# Patient Record
Sex: Female | Born: 1975
Health system: Southern US, Community
[De-identification: ages and names within clinical notes are randomized; demographics above are authoritative.]

## PROBLEM LIST (undated history)

## (undated) DIAGNOSIS — N83209 Unspecified ovarian cyst, unspecified side: Secondary | ICD-10-CM

## (undated) DIAGNOSIS — L280 Lichen simplex chronicus: Secondary | ICD-10-CM

## (undated) DIAGNOSIS — N920 Excessive and frequent menstruation with regular cycle: Secondary | ICD-10-CM

## (undated) DIAGNOSIS — N189 Chronic kidney disease, unspecified: Secondary | ICD-10-CM

## (undated) DIAGNOSIS — M542 Cervicalgia: Secondary | ICD-10-CM

## (undated) DIAGNOSIS — Z8489 Family history of other specified conditions: Secondary | ICD-10-CM

## (undated) DIAGNOSIS — M545 Low back pain, unspecified: Secondary | ICD-10-CM

## (undated) DIAGNOSIS — R519 Headache, unspecified: Secondary | ICD-10-CM

## (undated) DIAGNOSIS — N809 Endometriosis, unspecified: Secondary | ICD-10-CM

## (undated) DIAGNOSIS — M5440 Lumbago with sciatica, unspecified side: Secondary | ICD-10-CM

## (undated) DIAGNOSIS — R51 Headache: Secondary | ICD-10-CM

## (undated) DIAGNOSIS — K219 Gastro-esophageal reflux disease without esophagitis: Secondary | ICD-10-CM

## (undated) DIAGNOSIS — M5030 Other cervical disc degeneration, unspecified cervical region: Secondary | ICD-10-CM

## (undated) DIAGNOSIS — F419 Anxiety disorder, unspecified: Secondary | ICD-10-CM

## (undated) DIAGNOSIS — M1990 Unspecified osteoarthritis, unspecified site: Secondary | ICD-10-CM

## (undated) HISTORY — DX: Lumbago with sciatica, unspecified side: M54.40

## (undated) HISTORY — PX: ABDOMINAL HYSTERECTOMY: SHX81

## (undated) HISTORY — DX: Headache, unspecified: R51.9

## (undated) HISTORY — PX: ANKLE SURGERY: SHX546

## (undated) HISTORY — PX: GALLBLADDER SURGERY: SHX652

## (undated) HISTORY — DX: Headache: R51

---
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
## 2005-11-16 ENCOUNTER — Emergency Department: Payer: Self-pay | Admitting: Emergency Medicine

## 2005-11-18 ENCOUNTER — Inpatient Hospital Stay: Payer: Self-pay | Admitting: Internal Medicine

## 2006-04-19 ENCOUNTER — Emergency Department: Payer: Self-pay | Admitting: Emergency Medicine

## 2006-08-28 ENCOUNTER — Emergency Department: Payer: Self-pay | Admitting: Emergency Medicine

## 2007-03-19 ENCOUNTER — Emergency Department: Payer: Self-pay | Admitting: Emergency Medicine

## 2007-07-10 ENCOUNTER — Emergency Department: Payer: Self-pay | Admitting: Emergency Medicine

## 2007-10-07 ENCOUNTER — Emergency Department: Payer: Self-pay | Admitting: Emergency Medicine

## 2007-11-09 ENCOUNTER — Emergency Department: Payer: Self-pay | Admitting: Emergency Medicine

## 2009-03-22 ENCOUNTER — Emergency Department: Payer: Self-pay | Admitting: Emergency Medicine

## 2009-06-18 ENCOUNTER — Emergency Department: Payer: Self-pay | Admitting: Emergency Medicine

## 2010-02-04 ENCOUNTER — Emergency Department: Payer: Self-pay | Admitting: Emergency Medicine

## 2010-11-04 ENCOUNTER — Emergency Department: Payer: Self-pay | Admitting: Emergency Medicine

## 2013-04-12 ENCOUNTER — Emergency Department: Payer: Self-pay | Admitting: Emergency Medicine

## 2013-04-12 LAB — URINALYSIS, COMPLETE
Bilirubin,UR: NEGATIVE
Glucose,UR: NEGATIVE mg/dL (ref 0–75)
Nitrite: NEGATIVE
Ph: 5 (ref 4.5–8.0)
Protein: NEGATIVE
Specific Gravity: 1.025 (ref 1.003–1.030)
Squamous Epithelial: 9
WBC UR: 2 /HPF (ref 0–5)

## 2013-04-12 LAB — CBC
HCT: 37.7 % (ref 35.0–47.0)
HGB: 12.6 g/dL (ref 12.0–16.0)
MCHC: 33.6 g/dL (ref 32.0–36.0)
MCV: 83 fL (ref 80–100)
Platelet: 241 10*3/uL (ref 150–440)
RDW: 13.9 % (ref 11.5–14.5)
WBC: 5.8 10*3/uL (ref 3.6–11.0)

## 2013-04-12 LAB — COMPREHENSIVE METABOLIC PANEL
Alkaline Phosphatase: 64 U/L (ref 50–136)
BUN: 14 mg/dL (ref 7–18)
Creatinine: 0.89 mg/dL (ref 0.60–1.30)
EGFR (African American): >60
EGFR (Non-African Amer.): >60
Glucose: 126 mg/dL — ABNORMAL HIGH (ref 65–99)
Osmolality: 283 (ref 275–301)
SGOT(AST): 31 U/L (ref 15–37)
Sodium: 141 mmol/L (ref 136–145)

## 2013-04-12 IMAGING — US US RENAL KIDNEY
1 series · 14 of 25 positions shown · non-contrast
Comparison: none

REASON FOR EXAM: right flank pain hematuria
COMMENTS:

PROCEDURE:     US  - US KIDNEY  - [DATE] [DATE]
RESULT:     Comparison: None.
TECHNIQUE: Multiple grayscale and color Doppler images were obtained of the
kidneys.

[Series 1: us renal kidney · 0.25mm/px · 14 of 46 slices shown]
[im 1/46]
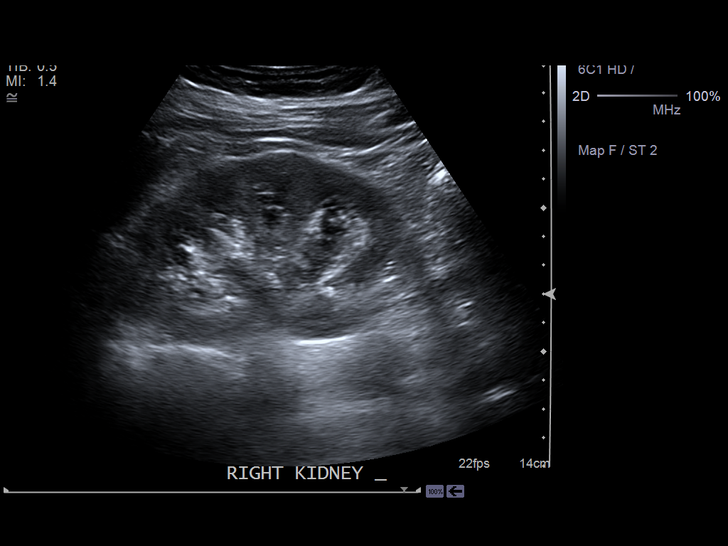
[im 4/46]
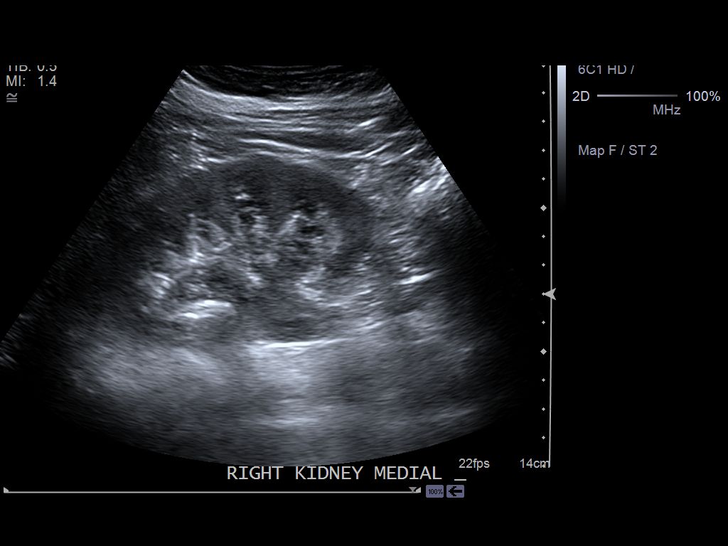
[im 8/46]
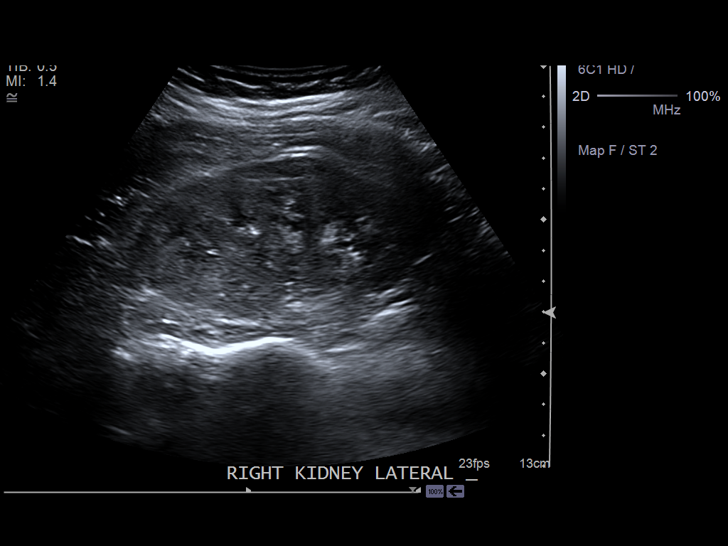
[im 12/46]
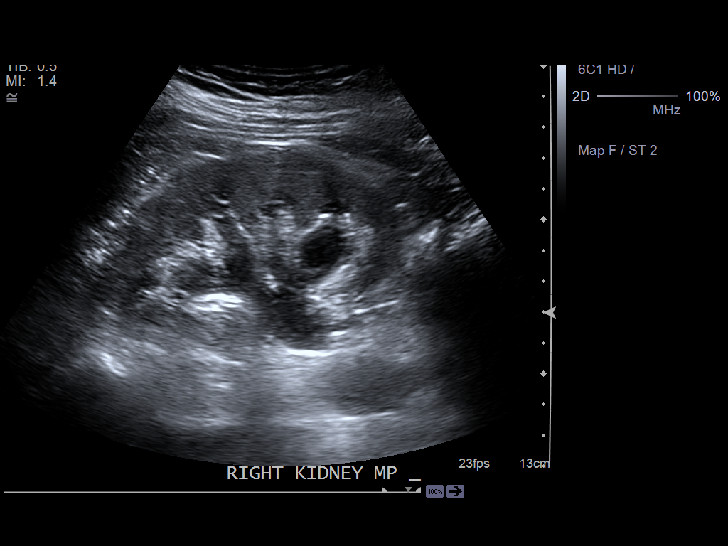
[im 16/46]
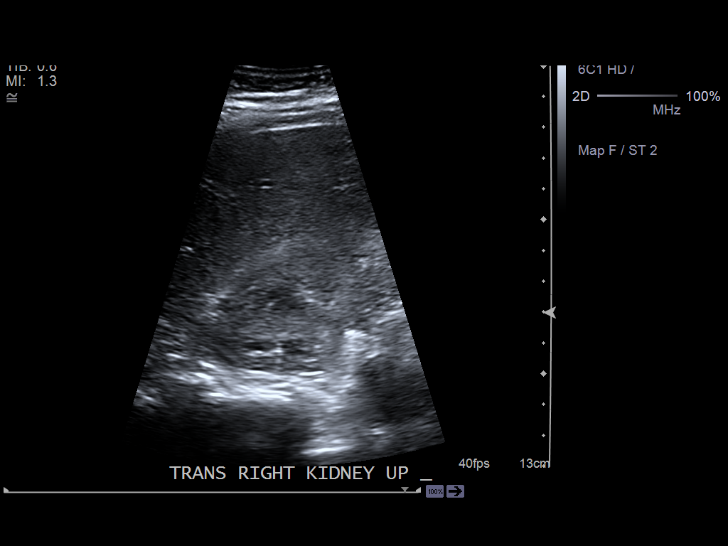
[im 17/46]
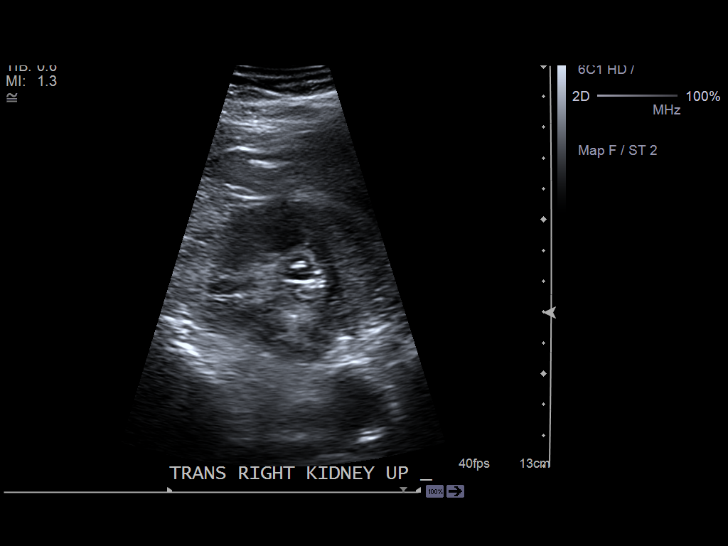
[im 21/46]
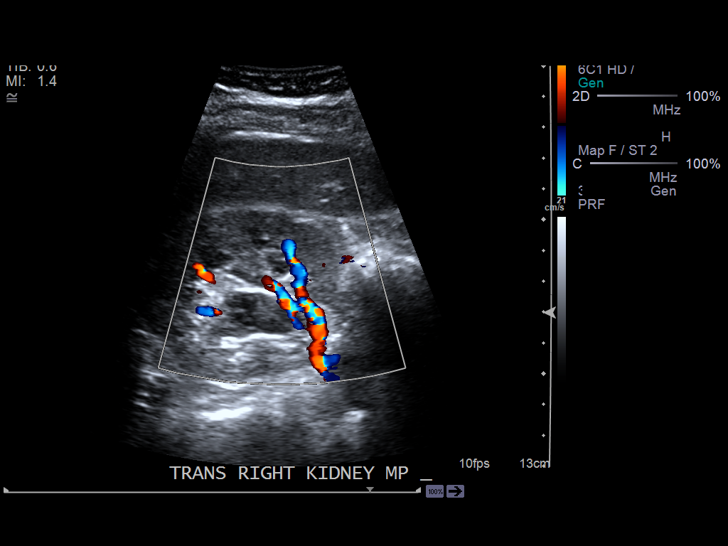
[im 25/46]
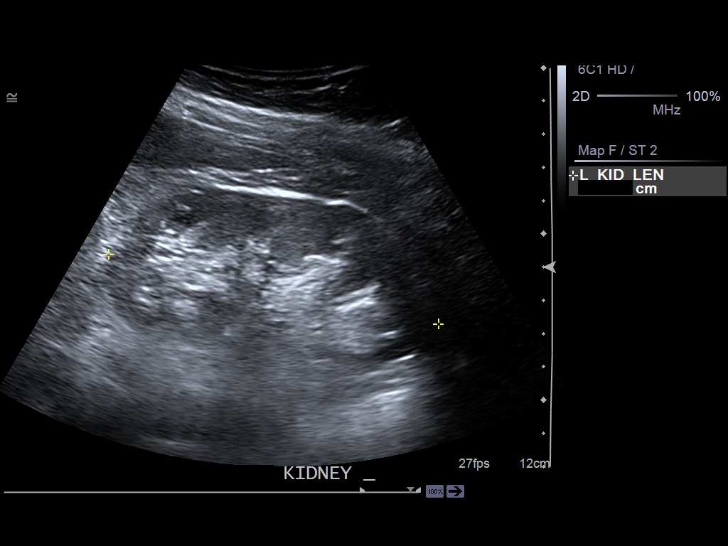
[im 29/46]
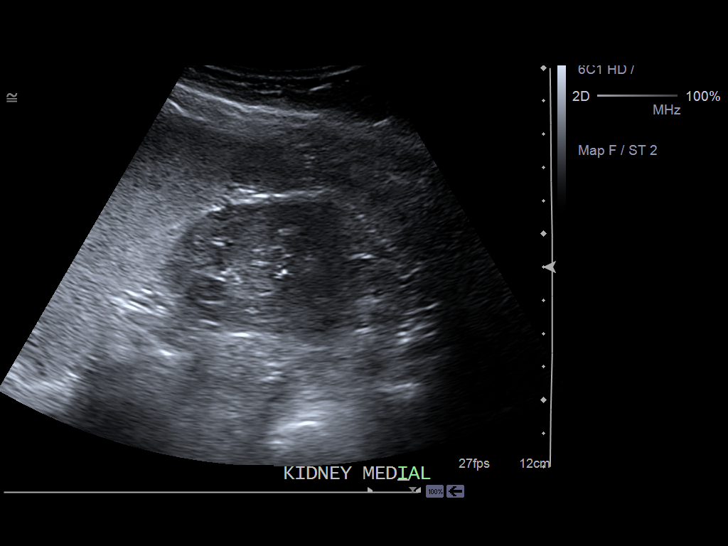
[im 31/46]
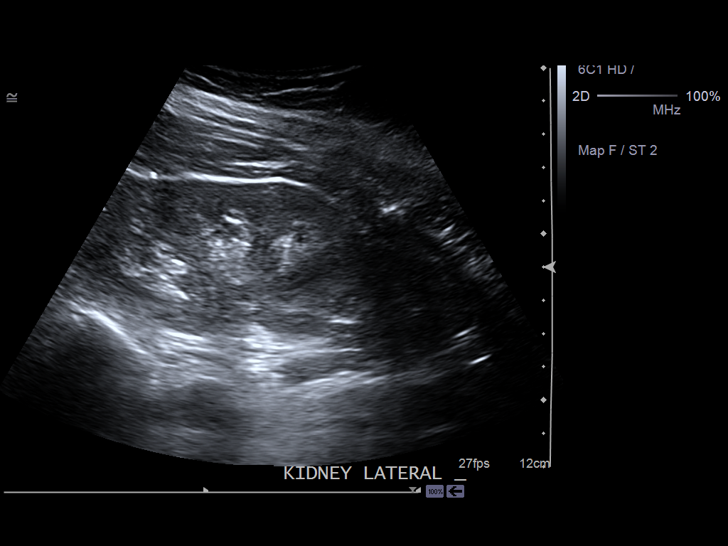
[im 34/46]
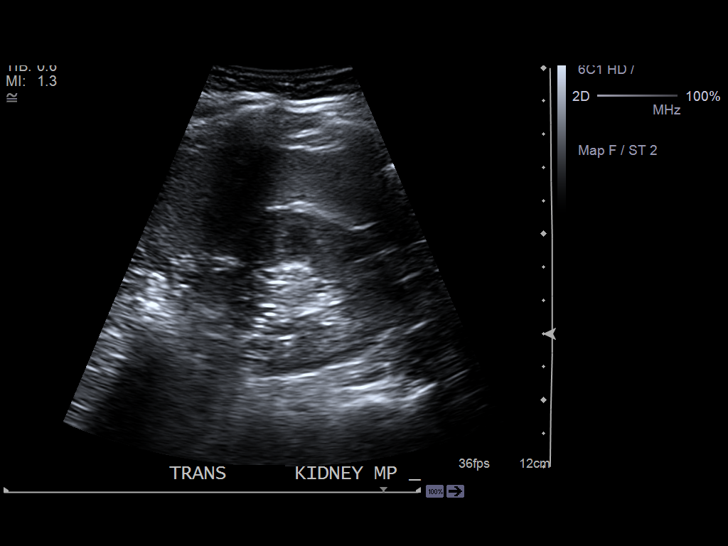
[im 38/46]
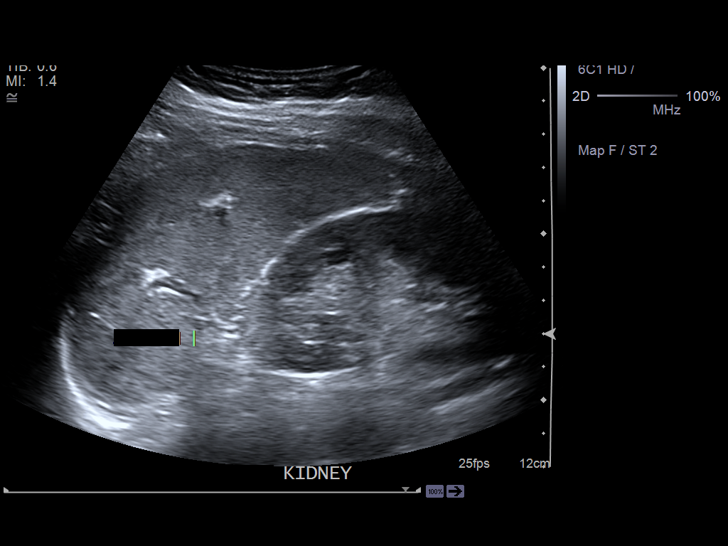
[im 42/46]
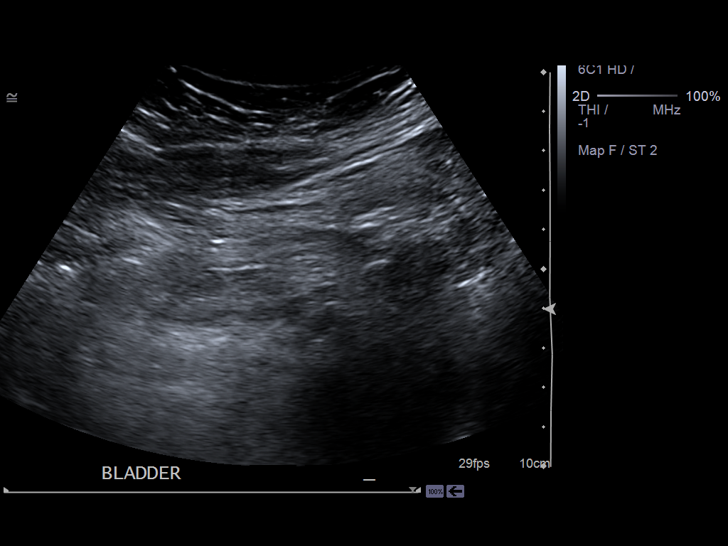
[im 46/46]
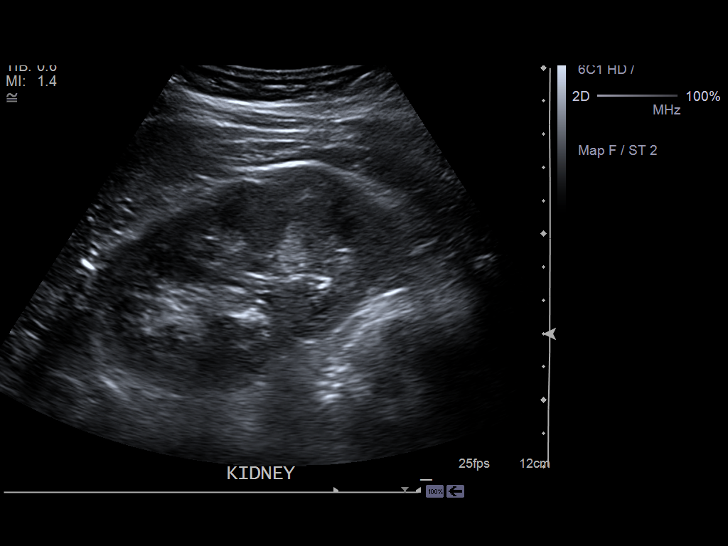

[14 of 25 positions shown; findings below may reference images not displayed]

FINDINGS: The right kidney measures 10.6 x 5.8 x 5.7 cm. The left kidney measures
x 4.9 x 4.4 cm. There is mild right hydronephrosis. Subcentimeter
hyperechoic foci within the right kidney may present renal calculi. No
hydronephrosis in the left kidney. The bladder is relatively decompressed.
Ureteral jets were not seen. The bladder is completely decompressed and the
post void images.
IMPRESSION: 1. Mild right hydronephrosis. Followup renal ultrasound or CT could be
performed, as indicated.
2. Possible right-sided nephrolithiasis.

[REDACTED]

## 2013-05-07 IMAGING — US US ABDOMEN LIMITED
1 series · 14 of 25 positions shown · non-contrast
Comparison: None.

CLINICAL DATA: Acute onset of right upper quadrant abdominal pain.
Initial encounter.

EXAM:
US ABDOMEN LIMITED - RIGHT UPPER QUADRANT

[Series 1: us abdomen limited · 0.25mm/px · 14 of 43 slices shown]
[im 1/43]
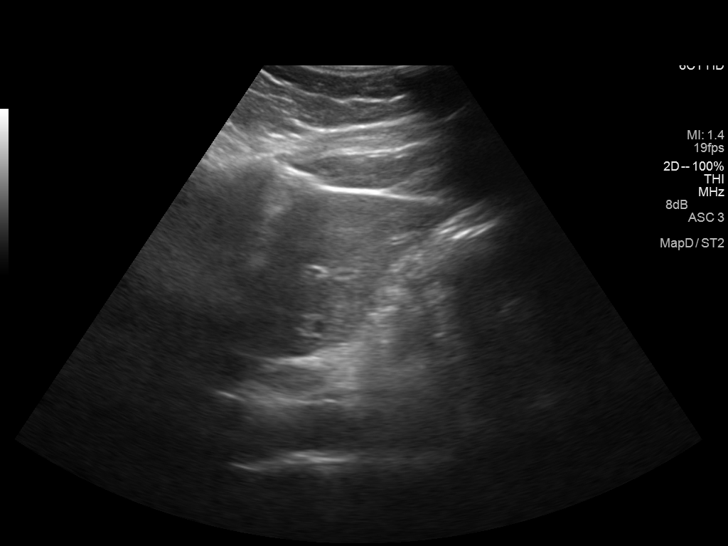
[im 4/43]
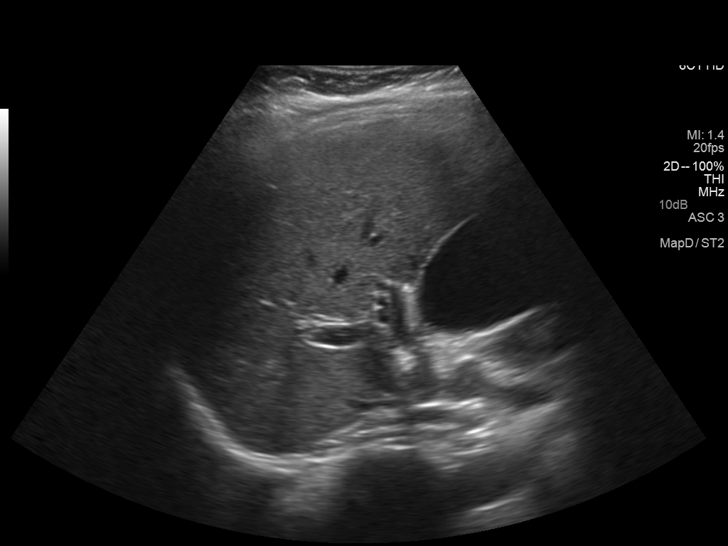
[im 8/43]
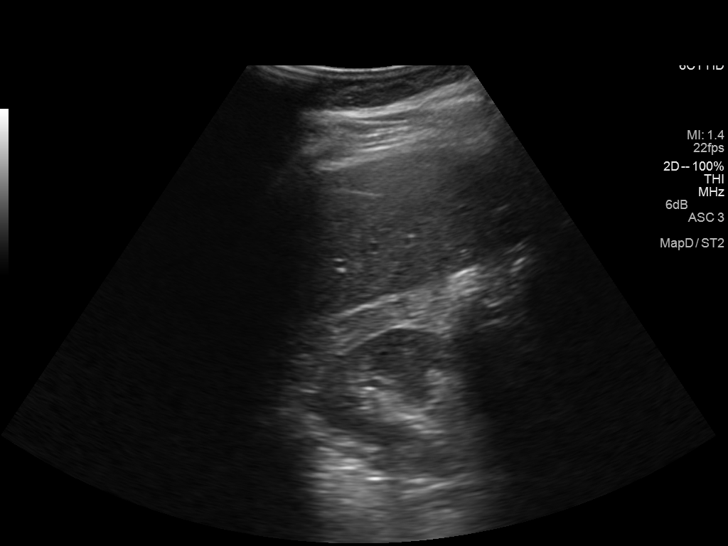
[im 11/43]
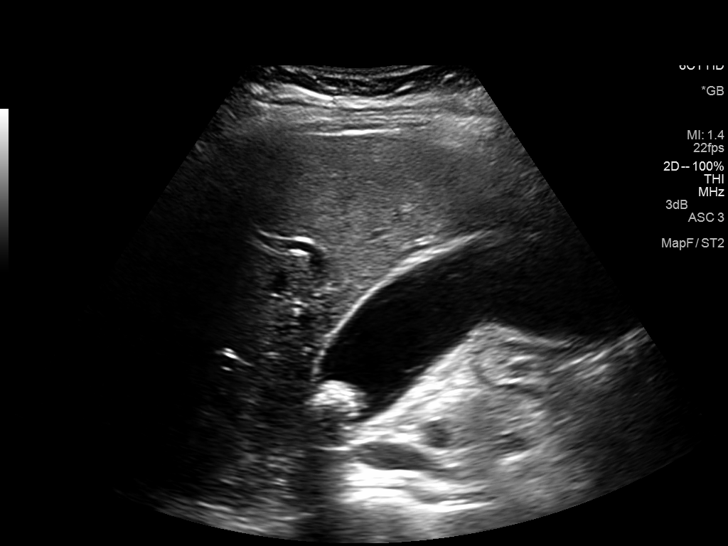
[im 15/43]
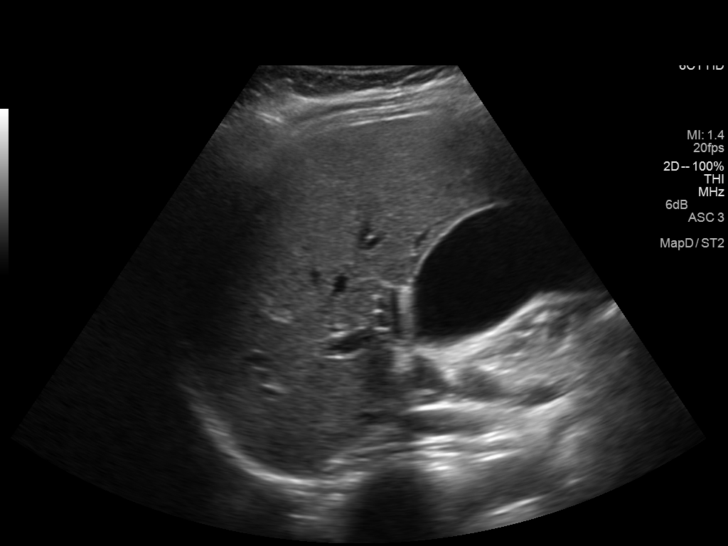
[im 16/43]
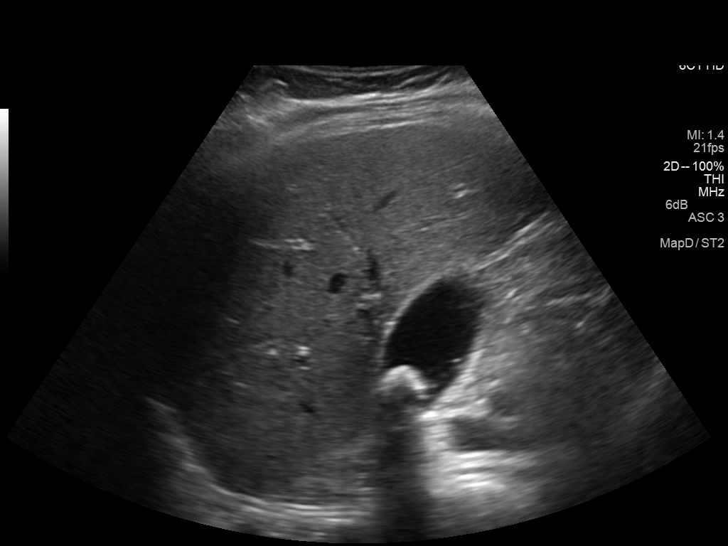
[im 20/43]
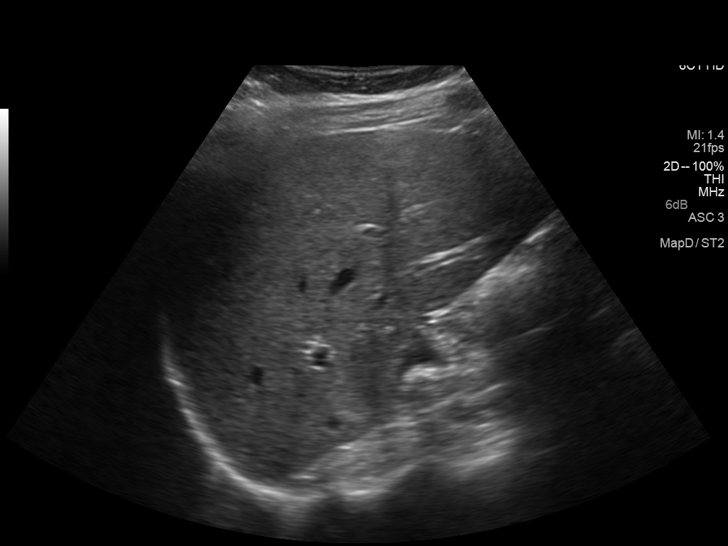
[im 23/43]
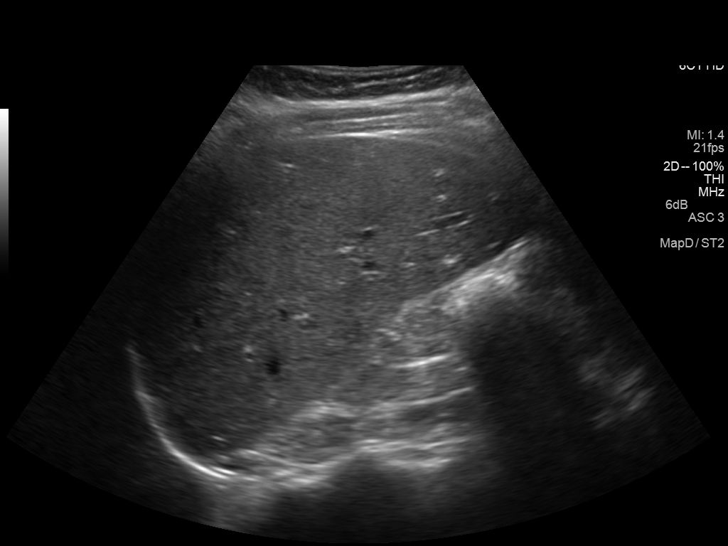
[im 27/43]
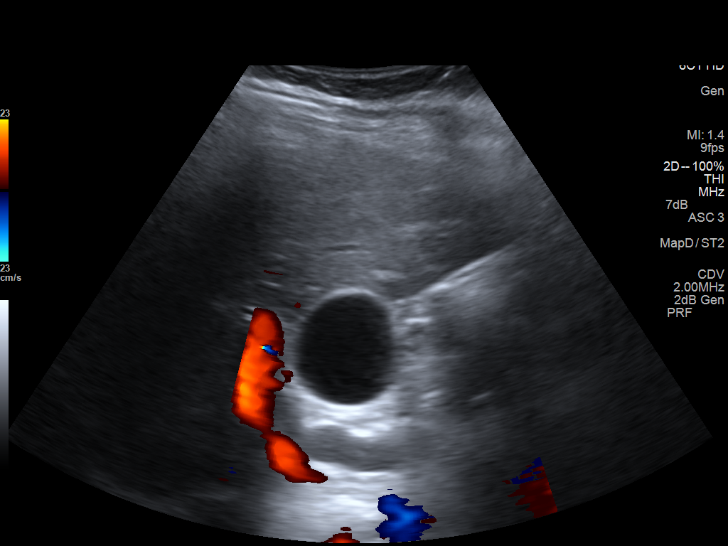
[im 29/43]
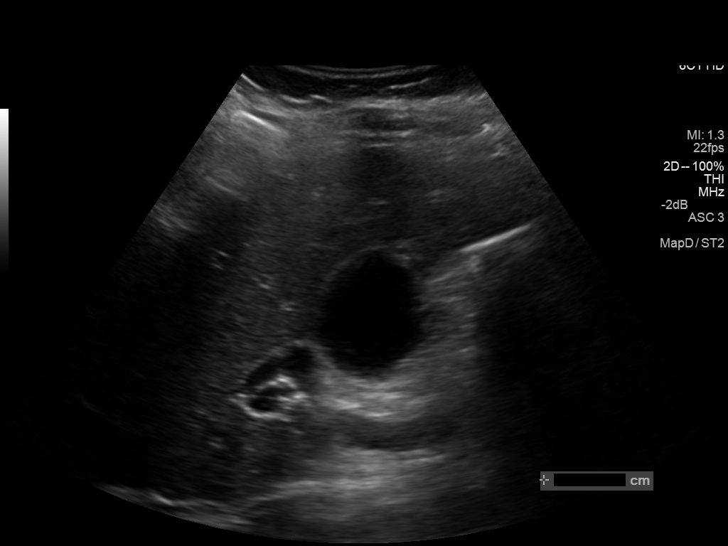
[im 32/43]
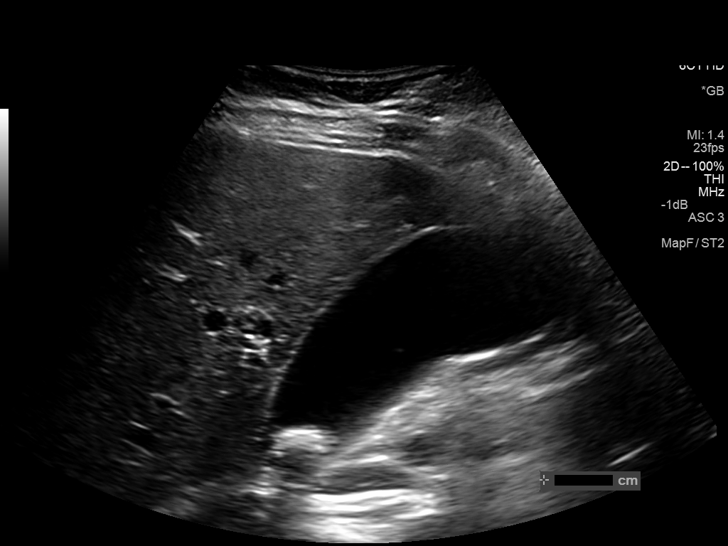
[im 36/43]
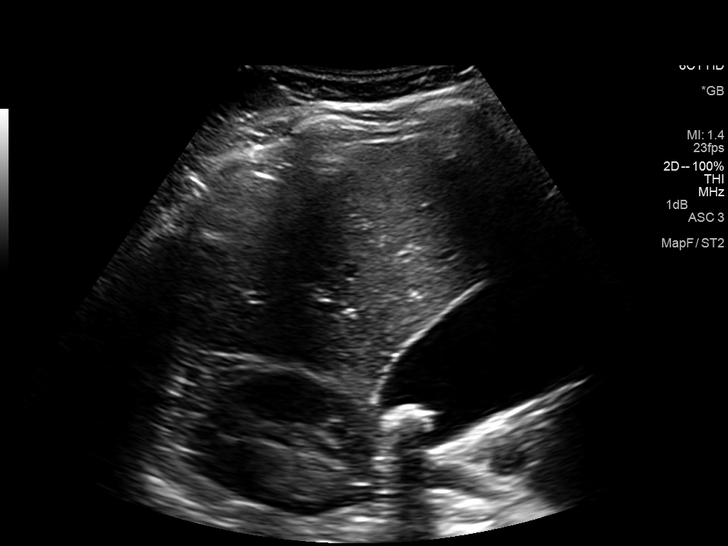
[im 39/43]
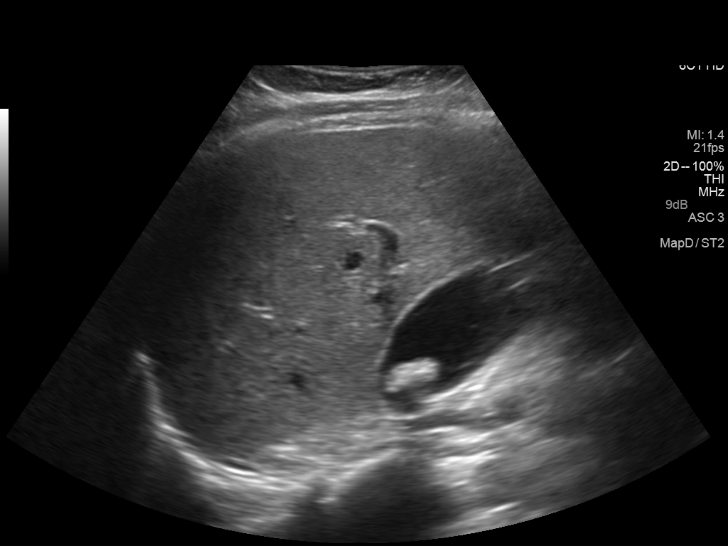
[im 43/43]
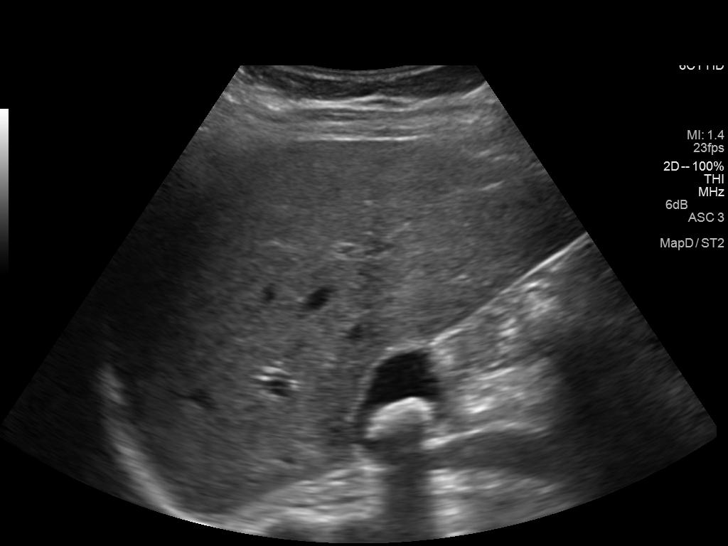

[14 of 25 positions shown; findings below may reference images not displayed]

FINDINGS: Gallbladder:

The gallbladder is mildly distended. There is a large 1.7 cm stone
lodged at the neck of the gallbladder. A positive ultrasonographic
Murphy's sign is elicited. No gallbladder wall thickening or
pericholecystic fluid is seen.

Common bile duct:

Diameter: 0.8 cm, dilated.

Liver:

No focal lesion identified. Within normal limits in parenchymal
echogenicity.
IMPRESSION: 1. Gallbladder mildly distended. Large 1.7 cm stone lodged at the
neck of the gallbladder. Positive ultrasonographic Murphy's sign
elicited. This raises concern for obstruction due to the stone at
the gallbladder neck.
2. Dilatation of the common bile duct also noted, to 0.8 cm. This
raises concern for distal obstruction. Would consider MRCP or ERCP
for further evaluation, as deemed clinically appropriate.

## 2014-03-23 DIAGNOSIS — L280 Lichen simplex chronicus: Secondary | ICD-10-CM | POA: Insufficient documentation

## 2014-11-25 ENCOUNTER — Emergency Department: Payer: Self-pay | Admitting: Family Medicine

## 2014-11-25 LAB — COMPREHENSIVE METABOLIC PANEL
ALK PHOS: 72 U/L (ref 46–116)
ALT: 26 U/L (ref 14–63)
Albumin: 3.9 g/dL (ref 3.4–5.0)
Anion Gap: 7 (ref 7–16)
BUN: 17 mg/dL (ref 7–18)
Bilirubin,Total: 0.4 mg/dL (ref 0.2–1.0)
CHLORIDE: 105 mmol/L (ref 98–107)
CO2: 28 mmol/L (ref 21–32)
Calcium, Total: 9.1 mg/dL (ref 8.5–10.1)
Creatinine: 0.77 mg/dL (ref 0.60–1.30)
EGFR (African American): >60
EGFR (Non-African Amer.): >60
Glucose: 107 mg/dL — ABNORMAL HIGH (ref 65–99)
OSMOLALITY: 281 (ref 275–301)
POTASSIUM: 3.4 mmol/L — AB (ref 3.5–5.1)
SGOT(AST): 27 U/L (ref 15–37)
Sodium: 140 mmol/L (ref 136–145)
TOTAL PROTEIN: 7.9 g/dL (ref 6.4–8.2)

## 2014-11-25 LAB — CBC WITH DIFFERENTIAL/PLATELET
BASOS PCT: 0.4 %
Basophil #: 0 10*3/uL (ref 0.0–0.1)
EOS ABS: 0.2 10*3/uL (ref 0.0–0.7)
EOS PCT: 2.5 %
HCT: 37.5 % (ref 35.0–47.0)
HGB: 12.7 g/dL (ref 12.0–16.0)
LYMPHS ABS: 1.7 10*3/uL (ref 1.0–3.6)
Lymphocyte %: 25.7 %
MCH: 28.8 pg (ref 26.0–34.0)
MCHC: 33.7 g/dL (ref 32.0–36.0)
MCV: 86 fL (ref 80–100)
MONO ABS: 0.4 x10 3/mm (ref 0.2–0.9)
Monocyte %: 6.1 %
NEUTROS PCT: 65.3 %
Neutrophil #: 4.3 10*3/uL (ref 1.4–6.5)
PLATELETS: 243 10*3/uL (ref 150–440)
RBC: 4.39 10*6/uL (ref 3.80–5.20)
RDW: 14.1 % (ref 11.5–14.5)
WBC: 6.6 10*3/uL (ref 3.6–11.0)

## 2014-11-25 LAB — URINALYSIS, COMPLETE
BLOOD: NEGATIVE
Bilirubin,UR: NEGATIVE
GLUCOSE, UR: NEGATIVE mg/dL (ref 0–75)
Ketone: NEGATIVE
Nitrite: NEGATIVE
Ph: 8 (ref 4.5–8.0)
SPECIFIC GRAVITY: 1.019 (ref 1.003–1.030)
WBC UR: 24 /HPF (ref 0–5)

## 2015-04-09 ENCOUNTER — Emergency Department: Payer: BLUE CROSS/BLUE SHIELD

## 2015-04-09 ENCOUNTER — Encounter: Payer: Self-pay | Admitting: Urgent Care

## 2015-04-09 DIAGNOSIS — R740 Nonspecific elevation of levels of transaminase and lactic acid dehydrogenase [LDH]: Secondary | ICD-10-CM | POA: Diagnosis not present

## 2015-04-09 DIAGNOSIS — K8050 Calculus of bile duct without cholangitis or cholecystitis without obstruction: Secondary | ICD-10-CM | POA: Diagnosis present

## 2015-04-09 DIAGNOSIS — R7989 Other specified abnormal findings of blood chemistry: Secondary | ICD-10-CM | POA: Diagnosis not present

## 2015-04-09 DIAGNOSIS — K8010 Calculus of gallbladder with chronic cholecystitis without obstruction: Secondary | ICD-10-CM | POA: Diagnosis not present

## 2015-04-09 DIAGNOSIS — R1011 Right upper quadrant pain: Secondary | ICD-10-CM | POA: Diagnosis present

## 2015-04-09 LAB — COMPREHENSIVE METABOLIC PANEL
ALBUMIN: 4.2 g/dL (ref 3.5–5.0)
ALK PHOS: 68 U/L (ref 38–126)
ALT: 104 U/L — ABNORMAL HIGH (ref 14–54)
ANION GAP: 9 (ref 5–15)
AST: 86 U/L — ABNORMAL HIGH (ref 15–41)
BUN: 12 mg/dL (ref 6–20)
CHLORIDE: 105 mmol/L (ref 101–111)
CO2: 25 mmol/L (ref 22–32)
CREATININE: 0.85 mg/dL (ref 0.44–1.00)
Calcium: 9.4 mg/dL (ref 8.9–10.3)
GFR calc Af Amer: >60 mL/min (ref >60)
Glucose, Bld: 101 mg/dL — ABNORMAL HIGH (ref 65–99)
POTASSIUM: 3.7 mmol/L (ref 3.5–5.1)
Sodium: 139 mmol/L (ref 135–145)
Total Bilirubin: 0.6 mg/dL (ref 0.3–1.2)
Total Protein: 7.7 g/dL (ref 6.5–8.1)

## 2015-04-09 LAB — CBC WITH DIFFERENTIAL/PLATELET
BASOS ABS: 0.1 10*3/uL (ref 0–0.1)
BASOS PCT: 1 %
Eosinophils Absolute: 0.3 10*3/uL (ref 0–0.7)
Eosinophils Relative: 3 %
HCT: 38.1 % (ref 35.0–47.0)
Hemoglobin: 12.4 g/dL (ref 12.0–16.0)
Lymphocytes Relative: 21 %
Lymphs Abs: 1.7 10*3/uL (ref 1.0–3.6)
MCH: 27.8 pg (ref 26.0–34.0)
MCHC: 32.7 g/dL (ref 32.0–36.0)
MCV: 85 fL (ref 80.0–100.0)
Monocytes Absolute: 0.7 10*3/uL (ref 0.2–0.9)
Monocytes Relative: 9 %
NEUTROS ABS: 5.3 10*3/uL (ref 1.4–6.5)
Neutrophils Relative %: 66 %
Platelets: 244 10*3/uL (ref 150–440)
RBC: 4.48 MIL/uL (ref 3.80–5.20)
RDW: 14.1 % (ref 11.5–14.5)
WBC: 8 10*3/uL (ref 3.6–11.0)

## 2015-04-09 LAB — LIPASE, BLOOD: LIPASE: 33 U/L (ref 22–51)

## 2015-04-09 LAB — TROPONIN I: Troponin I: <0.03 ng/mL (ref <0.031)

## 2015-04-09 MED ORDER — MORPHINE SULFATE 4 MG/ML IJ SOLN
INTRAMUSCULAR | Status: AC
  Administered 2015-04-09: 4 mg via INTRAVENOUS
  Filled 2015-04-09: qty 1

## 2015-04-09 MED ORDER — MORPHINE SULFATE 4 MG/ML IJ SOLN
4.0000 mg | Freq: Once | INTRAMUSCULAR | Status: AC
  Administered 2015-04-09: 4 mg via INTRAVENOUS

## 2015-04-09 MED ORDER — ONDANSETRON HCL 4 MG/2ML IJ SOLN
INTRAMUSCULAR | Status: AC
  Administered 2015-04-09: 4 mg via INTRAVENOUS
  Filled 2015-04-09: qty 2

## 2015-04-09 MED ORDER — ONDANSETRON HCL 4 MG/2ML IJ SOLN
4.0000 mg | Freq: Once | INTRAMUSCULAR | Status: AC
  Administered 2015-04-09: 4 mg via INTRAVENOUS

## 2015-04-09 NOTE — ED Notes (Addendum)
Patient presents with c/o RUQ abd pain with (+) N/V that began at 1400 today. (+) tenderness noted. Actively vomiting in triage. Has had similar pains in the past, however they have resided after a short time. Patient states, "This pain is getting worse and just wont go away."

## 2015-04-10 ENCOUNTER — Observation Stay
Admission: EM | Admit: 2015-04-10 | Discharge: 2015-04-11 | Disposition: A | Discharge status: Home / Self Care | Payer: BLUE CROSS/BLUE SHIELD | Attending: Surgery | Admitting: Surgery

## 2015-04-10 ENCOUNTER — Observation Stay: Payer: BLUE CROSS/BLUE SHIELD | Admitting: Anesthesiology

## 2015-04-10 ENCOUNTER — Encounter
Admission: EM | Disposition: A | Discharge status: Home / Self Care | Payer: Self-pay | Source: Home / Self Care | Attending: Emergency Medicine

## 2015-04-10 ENCOUNTER — Observation Stay: Payer: BLUE CROSS/BLUE SHIELD

## 2015-04-10 ENCOUNTER — Encounter: Payer: Self-pay | Admitting: Surgery

## 2015-04-10 DIAGNOSIS — R7989 Other specified abnormal findings of blood chemistry: Secondary | ICD-10-CM | POA: Diagnosis not present

## 2015-04-10 DIAGNOSIS — K810 Acute cholecystitis: Secondary | ICD-10-CM

## 2015-04-10 DIAGNOSIS — K8080 Other cholelithiasis without obstruction: Secondary | ICD-10-CM | POA: Diagnosis not present

## 2015-04-10 DIAGNOSIS — K8050 Calculus of bile duct without cholangitis or cholecystitis without obstruction: Secondary | ICD-10-CM

## 2015-04-10 DIAGNOSIS — K8020 Calculus of gallbladder without cholecystitis without obstruction: Secondary | ICD-10-CM

## 2015-04-10 DIAGNOSIS — R1011 Right upper quadrant pain: Secondary | ICD-10-CM

## 2015-04-10 HISTORY — PX: CHOLECYSTECTOMY: SHX55

## 2015-04-10 LAB — URINALYSIS COMPLETE WITH MICROSCOPIC (ARMC ONLY)
Bilirubin Urine: NEGATIVE
GLUCOSE, UA: NEGATIVE mg/dL
Leukocytes, UA: NEGATIVE
NITRITE: NEGATIVE
PROTEIN: NEGATIVE mg/dL
Specific Gravity, Urine: 1.029 (ref 1.005–1.030)
pH: 5 (ref 5.0–8.0)

## 2015-04-10 LAB — HEPATIC FUNCTION PANEL
ALBUMIN: 4.1 g/dL (ref 3.5–5.0)
ALT: 89 U/L — ABNORMAL HIGH (ref 14–54)
AST: 64 U/L — AB (ref 15–41)
Alkaline Phosphatase: 64 U/L (ref 38–126)
Bilirubin, Direct: <0.1 mg/dL — ABNORMAL LOW (ref 0.1–0.5)
TOTAL PROTEIN: 7.6 g/dL (ref 6.5–8.1)
Total Bilirubin: 0.6 mg/dL (ref 0.3–1.2)

## 2015-04-10 LAB — PREGNANCY, URINE: Preg Test, Ur: NEGATIVE

## 2015-04-10 SURGERY — LAPAROSCOPIC CHOLECYSTECTOMY WITH INTRAOPERATIVE CHOLANGIOGRAM
Anesthesia: General

## 2015-04-10 MED ORDER — SODIUM CHLORIDE 0.9 % IR SOLN
Status: DC | PRN
  Administered 2015-04-10: 1000 mL

## 2015-04-10 MED ORDER — FENTANYL CITRATE (PF) 100 MCG/2ML IJ SOLN
25.0000 ug | INTRAMUSCULAR | Status: AC | PRN
  Administered 2015-04-10 (×6): 25 ug via INTRAVENOUS

## 2015-04-10 MED ORDER — ONDANSETRON HCL 4 MG/2ML IJ SOLN
INTRAMUSCULAR | Status: AC
  Administered 2015-04-10: 4 mg via INTRAVENOUS
  Filled 2015-04-10: qty 2

## 2015-04-10 MED ORDER — IOTHALAMATE MEGLUMINE 60 % INJ SOLN
INTRAMUSCULAR | Status: DC | PRN
  Administered 2015-04-10: 10 mL

## 2015-04-10 MED ORDER — BUPIVACAINE-EPINEPHRINE (PF) 0.25% -1:200000 IJ SOLN
INTRAMUSCULAR | Status: AC
  Filled 2015-04-10: qty 30

## 2015-04-10 MED ORDER — FENTANYL CITRATE (PF) 100 MCG/2ML IJ SOLN
INTRAMUSCULAR | Status: AC
Start: 2015-04-10 — End: 2015-04-10
  Administered 2015-04-10: 25 ug
  Filled 2015-04-10: qty 2

## 2015-04-10 MED ORDER — PIPERACILLIN-TAZOBACTAM 3.375 G IVPB
3.3750 g | Freq: Three times a day (TID) | INTRAVENOUS | Status: DC
  Administered 2015-04-10 – 2015-04-11 (×4): 3.375 g via INTRAVENOUS
  Filled 2015-04-10 (×9): qty 50

## 2015-04-10 MED ORDER — GLYCOPYRROLATE 0.2 MG/ML IJ SOLN
INTRAMUSCULAR | Status: DC | PRN
  Administered 2015-04-10: 0.6 mg via INTRAVENOUS

## 2015-04-10 MED ORDER — ACETAMINOPHEN 10 MG/ML IV SOLN
INTRAVENOUS | Status: DC | PRN
  Administered 2015-04-10: 1000 mg via INTRAVENOUS

## 2015-04-10 MED ORDER — FENTANYL CITRATE (PF) 100 MCG/2ML IJ SOLN
INTRAMUSCULAR | Status: DC | PRN
  Administered 2015-04-10 (×2): 100 ug via INTRAVENOUS

## 2015-04-10 MED ORDER — MIDAZOLAM HCL 2 MG/2ML IJ SOLN
INTRAMUSCULAR | Status: DC | PRN
  Administered 2015-04-10: 2 mg via INTRAVENOUS

## 2015-04-10 MED ORDER — HYDROMORPHONE HCL 1 MG/ML IJ SOLN
1.0000 mg | INTRAMUSCULAR | Status: AC
  Administered 2015-04-10: 1 mg via INTRAVENOUS
  Filled 2015-04-10: qty 1

## 2015-04-10 MED ORDER — FENTANYL CITRATE (PF) 100 MCG/2ML IJ SOLN
INTRAMUSCULAR | Status: AC
  Filled 2015-04-10: qty 2

## 2015-04-10 MED ORDER — ROCURONIUM BROMIDE 100 MG/10ML IV SOLN
INTRAVENOUS | Status: DC | PRN
  Administered 2015-04-10: 5 mg via INTRAVENOUS
  Administered 2015-04-10: 35 mg via INTRAVENOUS

## 2015-04-10 MED ORDER — PROMETHAZINE HCL 25 MG/ML IJ SOLN
12.5000 mg | Freq: Once | INTRAMUSCULAR | Status: AC
  Administered 2015-04-10: 12.5 mg via INTRAVENOUS
  Filled 2015-04-10: qty 1

## 2015-04-10 MED ORDER — ACETAMINOPHEN 10 MG/ML IV SOLN
INTRAVENOUS | Status: AC
  Filled 2015-04-10: qty 100

## 2015-04-10 MED ORDER — DEXTROSE-NACL 5-0.45 % IV SOLN
INTRAVENOUS | Status: DC
  Administered 2015-04-10 (×2): via INTRAVENOUS

## 2015-04-10 MED ORDER — ONDANSETRON HCL 4 MG/2ML IJ SOLN: 4.0000 mg | Freq: Once | INTRAMUSCULAR | Status: DC | PRN

## 2015-04-10 MED ORDER — MORPHINE SULFATE 4 MG/ML IJ SOLN
INTRAMUSCULAR | Status: AC
  Administered 2015-04-10: 4 mg via INTRAVENOUS
  Filled 2015-04-10: qty 1

## 2015-04-10 MED ORDER — OXYCODONE HCL 5 MG PO TABS
5.0000 mg | ORAL_TABLET | ORAL | Status: DC | PRN
  Administered 2015-04-10: 5 mg via ORAL
  Filled 2015-04-10: qty 1

## 2015-04-10 MED ORDER — ACETAMINOPHEN 325 MG PO TABS: 650.0000 mg | ORAL_TABLET | Freq: Four times a day (QID) | ORAL | Status: DC | PRN

## 2015-04-10 MED ORDER — ENOXAPARIN SODIUM 40 MG/0.4ML ~~LOC~~ SOLN
40.0000 mg | SUBCUTANEOUS | Status: DC
  Administered 2015-04-10: 40 mg via SUBCUTANEOUS
  Filled 2015-04-10: qty 0.4

## 2015-04-10 MED ORDER — LACTATED RINGERS IV SOLN
INTRAVENOUS | Status: DC | PRN
Start: 2015-04-10 — End: 2015-04-10
  Administered 2015-04-10: 13:00:00 via INTRAVENOUS

## 2015-04-10 MED ORDER — ONDANSETRON HCL 4 MG/2ML IJ SOLN
INTRAMUSCULAR | Status: DC | PRN
  Administered 2015-04-10: 4 mg via INTRAVENOUS

## 2015-04-10 MED ORDER — ONDANSETRON HCL 4 MG/2ML IJ SOLN
4.0000 mg | Freq: Four times a day (QID) | INTRAMUSCULAR | Status: DC | PRN
  Administered 2015-04-10 (×3): 4 mg via INTRAVENOUS
  Filled 2015-04-10 (×3): qty 2

## 2015-04-10 MED ORDER — DEXAMETHASONE SODIUM PHOSPHATE 4 MG/ML IJ SOLN
INTRAMUSCULAR | Status: DC | PRN
  Administered 2015-04-10: 10 mg via INTRAVENOUS

## 2015-04-10 MED ORDER — NEOSTIGMINE METHYLSULFATE 10 MG/10ML IV SOLN
INTRAVENOUS | Status: DC | PRN
  Administered 2015-04-10: 4 mg via INTRAVENOUS

## 2015-04-10 MED ORDER — ONDANSETRON HCL 4 MG/2ML IJ SOLN
4.0000 mg | Freq: Once | INTRAMUSCULAR | Status: AC
  Administered 2015-04-10: 4 mg via INTRAVENOUS

## 2015-04-10 MED ORDER — MORPHINE SULFATE 4 MG/ML IJ SOLN
4.0000 mg | Freq: Once | INTRAMUSCULAR | Status: AC
  Administered 2015-04-10: 4 mg via INTRAVENOUS

## 2015-04-10 MED ORDER — PROPOFOL 10 MG/ML IV BOLUS
INTRAVENOUS | Status: DC | PRN
  Administered 2015-04-10: 100 mg via INTRAVENOUS

## 2015-04-10 MED ORDER — BUPIVACAINE-EPINEPHRINE (PF) 0.25% -1:200000 IJ SOLN
INTRAMUSCULAR | Status: DC | PRN
  Administered 2015-04-10: 30 mL via PERINEURAL

## 2015-04-10 MED ORDER — ACETAMINOPHEN 650 MG RE SUPP: 650.0000 mg | Freq: Four times a day (QID) | RECTAL | Status: DC | PRN

## 2015-04-10 MED ORDER — FAMOTIDINE 20 MG PO TABS
20.0000 mg | ORAL_TABLET | Freq: Every day | ORAL | Status: DC
  Administered 2015-04-11: 20 mg via ORAL
  Filled 2015-04-10: qty 1

## 2015-04-10 MED ORDER — HYDROMORPHONE HCL 1 MG/ML IJ SOLN
1.0000 mg | INTRAMUSCULAR | Status: DC | PRN
  Administered 2015-04-10: 1 mg via INTRAVENOUS
  Filled 2015-04-10: qty 1

## 2015-04-10 SURGICAL SUPPLY — 44 items
ADH LQ OCL WTPRF AMP STRL LF (MISCELLANEOUS) ×1
ADHESIVE MASTISOL STRL (MISCELLANEOUS) ×2 IMPLANT
APPLIER CLIP ROT 10 11.4 M/L (STAPLE) ×2
APR CLP MED LRG 11.4X10 (STAPLE) ×1
BLADE SURG SZ11 CARB STEEL (BLADE) ×2 IMPLANT
CANISTER SUCT 1200ML W/VALVE (MISCELLANEOUS) ×2 IMPLANT
CATH CHOLANGI 4FR 420404F (CATHETERS) ×2 IMPLANT
CHLORAPREP W/TINT 26ML (MISCELLANEOUS) ×2 IMPLANT
CLIP APPLIE ROT 10 11.4 M/L (STAPLE) ×1 IMPLANT
CONRAY 60ML FOR OR (MISCELLANEOUS) ×2 IMPLANT
DRAPE C-ARM XRAY 36X54 (DRAPES) ×2 IMPLANT
ENDOPOUCH RETRIEVER 10 (MISCELLANEOUS) ×2 IMPLANT
GAUZE SPONGE NON-WVN 2X2 STRL (MISCELLANEOUS) ×4 IMPLANT
GLOVE BIO SURGEON STRL SZ8 (GLOVE) ×6 IMPLANT
GOWN STRL REUS W/ TWL LRG LVL3 (GOWN DISPOSABLE) ×4 IMPLANT
GOWN STRL REUS W/TWL LRG LVL3 (GOWN DISPOSABLE) ×6
IRRIGATION STRYKERFLOW (MISCELLANEOUS) ×1 IMPLANT
IRRIGATOR STRYKERFLOW (MISCELLANEOUS) ×2
IV CATH ANGIO 12GX3 LT BLUE (NEEDLE) ×2 IMPLANT
IV NS 1000ML (IV SOLUTION) ×2
IV NS 1000ML BAXH (IV SOLUTION) ×1 IMPLANT
JACKSON PRATT 10 (INSTRUMENTS) ×1 IMPLANT
KIT RM TURNOVER STRD PROC AR (KITS) ×2 IMPLANT
LABEL OR SOLS (LABEL) ×1 IMPLANT
NDL SAFETY 22GX1.5 (NEEDLE) ×2 IMPLANT
NEEDLE VERESS 14GA 120MM (NEEDLE) ×2 IMPLANT
NS IRRIG 500ML POUR BTL (IV SOLUTION) ×2 IMPLANT
PACK LAP CHOLECYSTECTOMY (MISCELLANEOUS) ×2 IMPLANT
PAD GROUND ADULT SPLIT (MISCELLANEOUS) ×2 IMPLANT
SCISSORS METZENBAUM CVD 33 (INSTRUMENTS) ×2 IMPLANT
SLEEVE ENDOPATH XCEL 5M (ENDOMECHANICALS) ×2 IMPLANT
SPONGE EXCIL AMD DRAIN 4X4 6P (MISCELLANEOUS) ×2 IMPLANT
SPONGE LAP 18X18 5 PK (GAUZE/BANDAGES/DRESSINGS) ×2 IMPLANT
SPONGE VERSALON 2X2 STRL (MISCELLANEOUS) ×8
STRIP CLOSURE SKIN 1/2X4 (GAUZE/BANDAGES/DRESSINGS) ×2 IMPLANT
SUT MNCRL 4-0 (SUTURE) ×2
SUT MNCRL 4-0 27XMFL (SUTURE) ×1
SUT VICRYL 0 AB UR-6 (SUTURE) ×2 IMPLANT
SUTURE MNCRL 4-0 27XMF (SUTURE) ×1 IMPLANT
SUTURE MNCRYL 4-0 (SUTURE) ×1 IMPLANT
SYR 20CC LL (SYRINGE) ×2 IMPLANT
TROCAR XCEL NON-BLD 11X100MML (ENDOMECHANICALS) ×2 IMPLANT
TROCAR XCEL NON-BLD 5MMX100MML (ENDOMECHANICALS) ×4 IMPLANT
TUBING INSUFFLATOR HI FLOW (MISCELLANEOUS) ×2 IMPLANT

## 2015-04-10 NOTE — Progress Notes (Signed)
Dr. Michela Pitcher notified of pt having nausea. Not time for zofran. Phenergan 12.5 IV push once ordered.

## 2015-04-10 NOTE — Transfer of Care (Signed)
Immediate Anesthesia Transfer of Care Note  Patient: Christine Simmons  Procedure(s) Performed: Procedure(s): LAPAROSCOPIC CHOLECYSTECTOMY WITH INTRAOPERATIVE CHOLANGIOGRAM (N/A)  Patient Location: PACU  Anesthesia Type:General  Level of Consciousness: sedated and responds to stimulation  Airway & Oxygen Therapy: Patient Spontanous Breathing and Patient connected to face mask oxygen  Post-op Assessment: Report given to RN and Post -op Vital signs reviewed and stable  Post vital signs: Reviewed and stable  Last Vitals:  Filed Vitals:   04/10/15 1425  BP: 119/70  Pulse: 97  Temp: 36 C  Resp: 13    Complications: No apparent anesthesia complications

## 2015-04-10 NOTE — H&P (Signed)
Christine Simmons is a 39 y.o. female  with 12 hour history of midepigastric right upper quadrant abdominal pain.  HPI: She's had multiple episodes of midepigastric right upper quadrant abdominal pain of the last several months but none have been associated with the severity of the problem she faces tonight. She's also had no significant nausea or vomiting. She denies any indigestion bloating and diarrhea. She's had no previous workup.  She denies any history of hepatitis yellow jaundice pancreatitis peptic ulcer disease previous diagnosis of gallbladder disease or diverticulitis. She cannot get any relief from the current pain with her morphine doses at this time. Her only previous surgery was C-sections.  Workup revealed normal white blood cell count slightly elevated transaminases normal bilirubin and a gallbladder ultrasound which demonstrated multiple stones a large impacted cystic duct stone and a slightly dilated common bile duct. There was some gallbladder wall thickening but no evidence of obvious acute cholecystitis. She did have a positive sonographic Murphy sign. The surgical service was consulted.  History reviewed. No pertinent past medical history. Past Surgical History  Procedure Laterality Date  . Cesarean section     History   Social History  . Marital Status: Married    Spouse Name: N/A  . Number of Children: N/A  . Years of Education: N/A   Social History Main Topics  . Smoking status: Not on file  . Smokeless tobacco: Not on file  . Alcohol Use: Not on file  . Drug Use: Not on file  . Sexual Activity: Not on file   Other Topics Concern  . None   Social History Narrative  . None     Review of Systems  Constitutional: Negative for fever.  HENT: Negative.   Eyes: Negative.   Respiratory: Negative for cough and shortness of breath.   Cardiovascular: Negative for chest pain and palpitations.  Gastrointestinal: Positive for heartburn, nausea, vomiting and  abdominal pain. Negative for diarrhea.  Genitourinary: Negative.   Musculoskeletal: Positive for myalgias, back pain and joint pain.  Skin: Positive for rash.  Neurological: Positive for sensory change and focal weakness. Negative for weakness.  Endo/Heme/Allergies: Negative.   Psychiatric/Behavioral: Negative.     10 point review of systems was carried out. It is positive for back pain treated by the [facility]. She denies any other major symptoms or problems.   PHYSICAL EXAM: BP 116/87 mmHg  Pulse 73  Temp(Src) 98.9 F (37.2 C) (Oral)  Resp 18  Ht  (1.575 m)  Wt 66.225 kg (146 lb)  BMI 26.70 kg/m2  SpO2 100%  LMP 04/09/2015  Physical Exam  Constitutional: She appears well-developed and well-nourished. She appears distressed.  HENT:  Head: Normocephalic and atraumatic.  Eyes: EOM are normal. Pupils are equal, round, and reactive to light.  Neck: Normal range of motion. Neck supple. No tracheal deviation present. No thyromegaly present.  Cardiovascular: Normal rate, regular rhythm and normal heart sounds.   No murmur heard. Pulmonary/Chest: Effort normal and breath sounds normal.  Abdominal: Bowel sounds are normal. She exhibits no distension. There is tenderness. There is guarding. There is no rebound.  She has marked midepigastric tenderness with pain across the right upper quadrant. She has mild guarding with no rebound.  Musculoskeletal: Normal range of motion.  Neurological: She displays normal reflexes. No cranial nerve deficit.  Skin: Skin is warm. Rash noted.  Psychiatric: Her behavior is normal. Judgment normal.     Impression/Plan: I have independently reviewed her evaluation and her laboratory  and ultrasound findings. Her presentation suggests an impacted cystic duct stone with slightly dilated common bile duct. Her laboratory values do not support a ductal obstruction at this point. We will admit her to the hospital try to get her pain under control  reevaluate her in the morning. I discussed the possibility of surgical intervention with this intendant risks and benefits. She is in agreement.   Tiney Rouge III, MD  04/10/2015, 1:22 AM

## 2015-04-10 NOTE — Op Note (Signed)
Laparoscopic Cholecystectomy  Pre-operative Diagnosis: Acute cholecystitis  Post-operative Diagnosis: Acute cholecystitis  Surgeon: Adah Salvage. Excell Seltzer, MD FACS  Anesthesia: Gen. with endotracheal tube  Assistant: PA student  Procedure Details  The patient was seen again in the Holding Room. The benefits, complications, treatment options, and expected outcomes were discussed with the patient. The risks of bleeding, infection, recurrence of symptoms, failure to resolve symptoms, bile duct damage, bile duct leak, retained common bile duct stone, bowel injury, any of which could require further surgery and/or ERCP, stent, or papillotomy were reviewed with the patient. The likelihood of improving the patient's symptoms with return to their baseline status is good.  The patient and/or family concurred with the proposed plan, giving informed consent.  The patient was taken to Operating Room, identified as WHITTLEY JURKIEWICZ and the procedure verified as Laparoscopic Cholecystectomy.  A Time Out was held and the above information confirmed.  Prior to the induction of general anesthesia, antibiotic prophylaxis was administered. VTE prophylaxis was in place. General endotracheal anesthesia was then administered and tolerated well. After the induction, the abdomen was prepped with Chloraprep and draped in the sterile fashion. The patient was positioned in the supine position.  Local anesthetic  was injected into the skin near the umbilicus and an incision made. The Veress needle was placed. Pneumoperitoneum was then created with CO2 and tolerated well without any adverse changes in the patient's vital signs. A 68mm port was placed in the periumbilical position and the abdominal cavity was explored.  Two 5-mm ports were placed in the right upper quadrant and a 12 mm epigastric port was placed all under direct vision. All skin incisions  were infiltrated with a local anesthetic agent before making the incision and  placing the trocars.   The patient was positioned  in reverse Trendelenburg, tilted slightly to the patient's left.  The gallbladder was identified, the fundus grasped and retracted cephalad. Adhesions were lysed bluntly. The infundibulum was grasped and retracted laterally, exposing the peritoneum overlying the triangle of Calot. This was then divided and exposed in a blunt fashion. A critical view of the cystic duct and cystic artery was obtained.  The cystic duct was clearly identified and bluntly dissected.   The cystic duct was clipped and incised and through a separate incision and Angiocath a cholangiogram catheter was placed. C-arm for scopic cholangiography demonstrated good flow into the duodenum without intraluminal filling defects. Proximal ducts were well identified and the cystic duct had been cannulated.  The cystic duct catheter was removed. The cystic duct was doubly clipped and divided. The cystic artery and cystic lymphatics were doubly clipped and divided.  The gallbladder was taken from the gallbladder fossa in a retrograde fashion with the electrocautery. The gallbladder was removed and placed in an Endocatch bag. The liver bed was irrigated and inspected. Hemostasis was achieved with the electrocautery. Copious irrigation was utilized and was repeatedly aspirated until clear.  The gallbladder and Endocatch sac were then removed through the epigastric port site.   The port site was enlarged slightly due to the greatly edematous gallbladder.  Inspection of the right upper quadrant was performed. No bleeding, bile duct injury or leak, or bowel injury was noted. Pneumoperitoneum was released.  The epigastric port site was closed with figure-of-eight 0 Vicryl sutures. 4-0 subcuticular Monocryl was used to close the skin. Steristrips and Mastisol and sterile dressings were  applied.  The patient was then extubated and brought to the recovery room in stable condition. Sponge,  lap, and  needle counts were correct at closure and at the conclusion of the case.   Findings: Cholecystitis acute with edema  Estimated Blood Loss: 25 cc         Drains: None         Specimens: Gallbladder           Complications: none               Johnmichael Melhorn E. Excell Seltzer, MD, FACS

## 2015-04-10 NOTE — Progress Notes (Signed)
CC: Right upper quadrant pain Subjective: Patient with nausea after pain medication but otherwise feels better. Pain is much improved no back pain.  Objective: Vital signs in last 24 hours: Temp:  [97.5 F (36.4 C)-98.9 F (37.2 C)] 97.6 F (36.4 C) (06/14 1143) Pulse Rate:  [73-79] 79 (06/14 1143) Resp:  [16-20] 18 (06/14 1143) BP: (113-134)/(68-87) 115/72 mmHg (06/14 1143) SpO2:  [99 %-100 %] 100 % (06/14 1143) Weight:  [66.225 kg (146 lb)] 66.225 kg (146 lb) (06/13 2212)    Intake/Output from previous day:   Intake/Output this shift:    Physical exam:  No scleral icterus, no palpable neck nodes,  Abdomen is soft and minimally tender questionable Murphy sign.  Calves are nontender.  Lab Results: CBC   Recent Labs  04/09/15 2227  WBC 8.0  HGB 12.4  HCT 38.1  PLT 244   BMET  Recent Labs  04/09/15 2227  NA 139  K 3.7  CL 105  CO2 25  GLUCOSE 101*  BUN 12  CREATININE 0.85  CALCIUM 9.4   PT/INR No results for input(s): LABPROT, INR in the last 72 hours. ABG No results for input(s): PHART, HCO3 in the last 72 hours.  Invalid input(s): PCO2, PO2  Studies/Results: US Abdomen Limited Ruq  04/10/2015   CLINICAL DATA:  Acute onset of right upper quadrant abdominal pain. Initial encounter.  EXAM: US ABDOMEN LIMITED - RIGHT UPPER QUADRANT  COMPARISON:  None.  FINDINGS: Gallbladder:  The gallbladder is mildly distended. There is a large 1.7 cm stone lodged at the neck of the gallbladder. A positive ultrasonographic Murphy's sign is elicited. No gallbladder wall thickening or pericholecystic fluid is seen.  Common bile duct:  Diameter: 0.8 cm, dilated.  Liver:  No focal lesion identified. Within normal limits in parenchymal echogenicity.  IMPRESSION: 1. Gallbladder mildly distended. Large 1.7 cm stone lodged at the neck of the gallbladder. Positive ultrasonographic Murphy's sign elicited. This raises concern for obstruction due to the stone at the gallbladder neck.  2. Dilatation of the common bile duct also noted, to 0.8 cm. This raises concern for distal obstruction. Would consider MRCP or ERCP for further evaluation, as deemed clinically appropriate.   Electronically Signed   By: Roanna Raider M.D.   On: 04/10/2015 00:52    Anti-infectives: Anti-infectives    Start     Dose/Rate Route Frequency Ordered Stop   04/10/15 0145  piperacillin-tazobactam (ZOSYN) IVPB 3.375 g     3.375 g 12.5 mL/hr over 240 Minutes Intravenous 3 times per day 04/10/15 0135        Assessment/Plan:  History labs personally reviewed. LFTs are improved. Recommend laparoscopic cholecystectomy with cholangiography. The rationale for this discussed with she and her family. The options of observation were reviewed. The risks of bleeding infection recurrent symptomatic recurrence discussed also the risk of conversion to an open procedure or retained common bile duct stone necessitating ERCP was reviewed she understood and agreed to proceed.  Lattie Haw, MD, FACS  04/10/2015

## 2015-04-10 NOTE — ED Provider Notes (Signed)
[facility] Emergency Department Provider Note  ____________________________________________  Time seen: 12:10 AM  I have reviewed the triage vital signs and the nursing notes.   HISTORY  Chief Complaint Abdominal Pain      HPI Christine Simmons is a 39 y.o. female resents with 10 out of 10 right upper quadrant pain that is nonradiating with onset at 2:00 today. Patient recalls a previous episode of the same last month however states she has had intermittent pain and right upper quadrant following eating for "a while". Patient denies any fever at this time continues to be nauseated.     History reviewed. No pertinent past medical history.  There are no active problems to display for this patient.   Past Surgical History  Procedure Laterality Date  . Cesarean section      No current outpatient prescriptions on file.  Allergies Sulfa antibiotics  No family history on file.  Social History History  Substance Use Topics  . Smoking status: Not on file  . Smokeless tobacco: Not on file  . Alcohol Use: Not on file    Review of Systems  Constitutional: Negative for fever. Eyes: Negative for visual changes. ENT: Negative for sore throat. Cardiovascular: Negative for chest pain. Respiratory: Negative for shortness of breath. Gastrointestinal: Positive for abdominal pain, vomiting.  Genitourinary: Negative for dysuria. Musculoskeletal: Negative for back pain. Skin: Negative for rash. Neurological: Negative for headaches, focal weakness or numbness.   10-point ROS otherwise negative.  ____________________________________________   PHYSICAL EXAM:  VITAL SIGNS: ED Triage Vitals  Enc Vitals Group     BP 04/09/15 2212 116/87 mmHg     Pulse Rate 04/09/15 2212 73     Resp 04/09/15 2212 18     Temp 04/09/15 2212 98.9 F (37.2 C)     Temp Source 04/09/15 2212 Oral     SpO2 04/09/15 2212 100 %     Weight 04/09/15 2212 146 lb (66.225 kg)      Height 04/09/15 2212 5\' 2"  (1.575 m)     Head Cir --      Peak Flow --      Pain Score 04/09/15 2213 7     Pain Loc --      Pain Edu? --      Excl. in GC? --      Constitutional: Alert and oriented. Well appearing and in no distress. Eyes: Conjunctivae are normal. PERRL. Normal extraocular movements. ENT   Head: Normocephalic and atraumatic.   Nose: No congestion/rhinnorhea.   Mouth/Throat: Mucous membranes are moist.   Neck: No stridor. Hematological/Lymphatic/Immunilogical: No cervical lymphadenopathy. Cardiovascular: Normal rate, regular rhythm. Normal and symmetric distal pulses are present in all extremities. No murmurs, rubs, or gallops. Respiratory: Normal respiratory effort without tachypnea nor retractions. Breath sounds are clear and equal bilaterally. No wheezes/rales/rhonchi. Gastrointestinal: Soft and nontender. No distention. There is no CVA tenderness. Genitourinary: deferred Musculoskeletal: Nontender with normal range of motion in all extremities. No joint effusions.  No lower extremity tenderness nor edema. Neurologic:  Normal speech and language. No gross focal neurologic deficits are appreciated. Speech is normal.  Skin:  Skin is warm, dry and intact. No rash noted. Psychiatric: Mood and affect are normal. Speech and behavior are normal. Patient exhibits appropriate insight and judgment.  ____________________________________________    LABS (pertinent positives/negatives)  Labs Reviewed  COMPREHENSIVE METABOLIC PANEL - Abnormal; Notable for the following:    Glucose, Bld 101 (*)    AST 86 (*)  ALT 104 (*)    All other components within normal limits  CBC WITH DIFFERENTIAL/PLATELET  LIPASE, BLOOD  TROPONIN I  URINALYSIS COMPLETEWITH MICROSCOPIC ([facility] ONLY)     _____  RADIOLOGY  Ultrasound revealed 1.7 cm gallstone with gallbladder wall thickening or common bile duct dilated at 0.8 cm consistent with possible acute cholecystitis  and choledocholithiasis. ____________________________________________      INITIAL IMPRESSION / ASSESSMENT AND PLAN / ED COURSE  Pertinent labs & imaging results that were available during my care of the patient were reviewed by me and considered in my medical decision making (see chart for details).  History physical exam and ultrasound consistent with cholecystitis/choledocholithiasis as such Dr. Michela Pitcher general surgery was notified.  ____________________________________________   FINAL CLINICAL IMPRESSION(S) / ED DIAGNOSES  Final diagnoses:  Acute cholecystitis  Choledocholithiasis      Darci Current, MD 04/10/15 [phone]

## 2015-04-10 NOTE — Progress Notes (Signed)
Pt was taken to surgery at 1215, pregnancy test was done which was negative. Family aware pt in surgery.

## 2015-04-10 NOTE — Progress Notes (Signed)
Pt returned from surgery, had gallbladder removed.  Abdomen examined.  Bandages intact no apparent drainage.  Pt c/o mild pain and N.  Given dose of Zofran for same.

## 2015-04-10 NOTE — Anesthesia Postprocedure Evaluation (Signed)
  Anesthesia Post-op Note  Patient: Christine Simmons  Procedure(s) Performed: Procedure(s): LAPAROSCOPIC CHOLECYSTECTOMY WITH INTRAOPERATIVE CHOLANGIOGRAM (N/A)  Anesthesia type:General  Patient location: PACU  Post pain: Pain level controlled  Post assessment: Post-op Vital signs reviewed, Patient's Cardiovascular Status Stable, Respiratory Function Stable, Patent Airway and No signs of Nausea or vomiting  Post vital signs: Reviewed and stable  Last Vitals:  Filed Vitals:   04/10/15 1500  BP:   Pulse: 71  Temp:   Resp: 11    Level of consciousness: awake, alert  and patient cooperative  Complications: No apparent anesthesia complications

## 2015-04-10 NOTE — ED Notes (Signed)
Dr. Ely in to see pt.  

## 2015-04-10 NOTE — Anesthesia Preprocedure Evaluation (Signed)
Anesthesia Evaluation  Patient identified by MRN, date of birth, ID band Patient awake    Reviewed: Allergy & Precautions, NPO status , Patient's Chart, lab work & pertinent test results  Airway Mallampati: II  TM Distance: >3 FB Neck ROM: Full    Dental  (+) Chipped   Pulmonary former smoker (quit x 18 yrs),          Cardiovascular     Neuro/Psych    GI/Hepatic GERD-  ,  Endo/Other    Renal/GU Renal disease (stones)     Musculoskeletal   Abdominal   Peds  Hematology   Anesthesia Other Findings   Reproductive/Obstetrics                             Anesthesia Physical Anesthesia Plan  ASA: II  Anesthesia Plan: General   Post-op Pain Management:    Induction: Intravenous  Airway Management Planned: Oral ETT  Additional Equipment:   Intra-op Plan:   Post-operative Plan:   Informed Consent: I have reviewed the patients History and Physical, chart, labs and discussed the procedure including the risks, benefits and alternatives for the proposed anesthesia with the patient or authorized representative who has indicated his/her understanding and acceptance.     Plan Discussed with:   Anesthesia Plan Comments:         Anesthesia Quick Evaluation

## 2015-04-10 NOTE — Anesthesia Procedure Notes (Signed)
Procedure Name: Intubation Date/Time: 04/10/2015 1:35 PM Performed by: Omer Jack Pre-anesthesia Checklist: Patient identified, Patient being monitored, Timeout performed, Emergency Drugs available and Suction available Patient Re-evaluated:Patient Re-evaluated prior to inductionOxygen Delivery Method: Circle system utilized Preoxygenation: Pre-oxygenation with 100% oxygen Intubation Type: IV induction Ventilation: Mask ventilation without difficulty Laryngoscope Size: Miller and 2 Grade View: Grade I Tube type: Oral Tube size: 7.0 mm Number of attempts: 1 Placement Confirmation: ETT inserted through vocal cords under direct vision,  positive ETCO2 and breath sounds checked- equal and bilateral Secured at: 21 cm Tube secured with: Tape Dental Injury: Teeth and Oropharynx as per pre-operative assessment

## 2015-04-10 NOTE — Progress Notes (Signed)
She feels significantly better this morning with Dilaudid as her pain medicine. She is no longer nauseated. She continues to have midepigastric right upper quadrant pain. I will discuss surgical options with my associate Dr. Excell Seltzer. We will recheck her liver function studies.

## 2015-04-11 LAB — CBC WITH DIFFERENTIAL/PLATELET
BASOS ABS: 0 10*3/uL (ref 0–0.1)
BASOS PCT: 0 %
EOS PCT: 0 %
Eosinophils Absolute: 0 10*3/uL (ref 0–0.7)
HCT: 35.9 % (ref 35.0–47.0)
HEMOGLOBIN: 12 g/dL (ref 12.0–16.0)
LYMPHS PCT: 4 %
Lymphs Abs: 0.6 10*3/uL — ABNORMAL LOW (ref 1.0–3.6)
MCH: 28.2 pg (ref 26.0–34.0)
MCHC: 33.3 g/dL (ref 32.0–36.0)
MCV: 84.5 fL (ref 80.0–100.0)
MONO ABS: 0.7 10*3/uL (ref 0.2–0.9)
MONOS PCT: 5 %
NEUTROS ABS: 13.2 10*3/uL — AB (ref 1.4–6.5)
Neutrophils Relative %: 91 %
Platelets: 244 10*3/uL (ref 150–440)
RBC: 4.25 MIL/uL (ref 3.80–5.20)
RDW: 14.1 % (ref 11.5–14.5)
WBC: 14.4 10*3/uL — ABNORMAL HIGH (ref 3.6–11.0)

## 2015-04-11 LAB — COMPREHENSIVE METABOLIC PANEL
ALT: 91 U/L — ABNORMAL HIGH (ref 14–54)
AST: 63 U/L — ABNORMAL HIGH (ref 15–41)
Albumin: 3.7 g/dL (ref 3.5–5.0)
Alkaline Phosphatase: 59 U/L (ref 38–126)
Anion gap: 7 (ref 5–15)
BUN: 8 mg/dL (ref 6–20)
CALCIUM: 9 mg/dL (ref 8.9–10.3)
CO2: 26 mmol/L (ref 22–32)
CREATININE: 0.75 mg/dL (ref 0.44–1.00)
Chloride: 106 mmol/L (ref 101–111)
GFR calc non Af Amer: >60 mL/min (ref >60)
Glucose, Bld: 182 mg/dL — ABNORMAL HIGH (ref 65–99)
POTASSIUM: 3.7 mmol/L (ref 3.5–5.1)
SODIUM: 139 mmol/L (ref 135–145)
TOTAL PROTEIN: 7 g/dL (ref 6.5–8.1)
Total Bilirubin: 0.3 mg/dL (ref 0.3–1.2)

## 2015-04-11 MED ORDER — OXYCODONE-ACETAMINOPHEN 5-325 MG PO TABS: 1.0000 | ORAL_TABLET | ORAL | Status: DC | PRN

## 2015-04-11 NOTE — Discharge Instructions (Addendum)
Remove dressings in 24 hours area may shower. Follow-up with Dr. Excell Seltzer in 10 days.   Cholecystostomy  The gallbladder is a pear-shaped organ that lies beneath the liver on the right side of the body. The gallbladder stores bile, a fluid that helps the body digest fats. However, sometimes bile and other fluids build up in the gallbladder because of an obstruction (for example, a gallstones). This can cause fever, pain, swelling, nausea and other serious symptoms. The procedure used to drain these fluids is called a cholecystostomy. A tube is inserted into the gallbladder. Fluid drains through the tube into a plastic bag outside the body. This procedure is usually done on people who are admitted to the hospital. The procedure is often recommended for people who cannot have gallbladder surgery right away, usually because they are too ill to make it through surgery. The cholecystostomy tube is usually temporary, until surgery can be done. RISKS AND COMPLICATIONS Although rare, complications can include:  Clogging of the tube.  Infection in or around the drain site. Antibiotics might be prescribed for the infection. Or, another tube might be inserted to drain the infected fluid.  Internal bleeding from the liver. BEFORE THE PROCEDURE   Try to quit smoking several weeks before the procedure. Smoking can slow healing.  Arrange for someone to drive you home from the hospital.  Right before your procedure, avoid all foods and liquids after midnight. This includes coffee, tea and water.  On the day of the procedure, arrive early to fill out all the paperwork. PROCEDURE You will be given a sedative to make you sleepy and a local anesthetic to numb the skin. Next, a small cut is made in the abdomen. Then a tube is threaded through the cut into the gallbladder. The procedure is usually done with ultrasound to guide the tube into the gallbladder. Once the tube is in place, the drain is secured to the  skin with a stitch. The tube is then connected to a drainage bag.  AFTER THE PROCEDURE   People who have a cholecystostomy usually stay in the hospital for several days because they are so ill. You might not be able to eat for the first few days. Instead, you will be connected to an IV for fluids and nutrients.  The procedure does not cure the blockage that caused the fluid to build up in the first place. Because of this, the gallbladder will need to be removed in the future. The drain is removed at that time. HOME CARE INSTRUCTIONS  Be sure to follow your healthcare provider's instructions carefully. You may shower but avoid tub baths and swimming until your caregiver says it is OK. Eat and drink according to the directions you have been given. And be sure to make all follow-up appointments.  Call your healthcare provider if you notice new pain, redness or swelling around the wound. SEEK IMMEDIATE MEDICAL CARE IF:   There is increased abdominal pain.  Nausea or vomiting occurs.  You develop a fever.  The drainage tube comes out of the abdomen. Document Released: 01/09/2009 Document Revised: 01/05/2012 Document Reviewed: 01/09/2009 [facility] Patient Information 2015 [place], [place]. This information is not intended to replace advice given to you by your health care provider. Make sure you discuss any questions you have with your health care provider.

## 2015-04-11 NOTE — Discharge Summary (Signed)
Physician Discharge Summary  Patient ID: Christine Simmons MRN: [id number] DOB/AGE: May 09, 1976 39 y.o.  Admit date: 04/10/2015 Discharge date: 04/11/2015  Admission Diagnoses:  Discharge Diagnoses:  Active Problems:   Cholelithiasis   Discharged Condition: Stable  Hospital Course: Patient was admitted the hospital with slightly elevated liver function tests and signs of acute cholecystitis. She was taken to the operating room where laparoscopic cholecystectomy confirmed acute cholecystitis and the cholangiogram was normal. Postoperatively she has done well and is tolerating a diet. She will be discharged on oral analgesia excess to follow-up in our office in 10 days. She may shower.  Consults: None  Significant Diagnostic Studies: ct  Treatments: Laparoscopic cholecystectomy  Discharge Exam: Blood pressure 118/65, pulse 68, temperature 97.7 F (36.5 C), temperature source Oral, resp. rate 18, height 5\' 2"  (1.575 m), weight 66.225 kg (146 lb), last menstrual period 04/09/2015, SpO2 100 %. Physical exam shows a soft nontender abdomen in the postoperative period.  Disposition:      Medication List    Notice    You have not been prescribed any medications.         Follow-up Information    Schedule an appointment as soon as possible for a visit with Dionne Milo, MD.   Specialties:  Surgery, Radiology   Why:  For wound re-check   Contact information:   [address] [place] [address] [phone]       Signed: Dionne Milo 04/11/2015, 8:35 AM

## 2015-04-11 NOTE — Progress Notes (Signed)
Order received to dc patient home . Alert and oriented. VSS. Passing flatus. Denies pain. Tolerated full liquid diet. No other issues identified. Discharge instructions given. Patient verbalized understanding.

## 2015-04-11 NOTE — Progress Notes (Signed)
1 Day Post-Op  Subjective:  Postop day 1 from laparoscopic cholecystectomy for severe acute cholecystitis. She feels much better today. Nausea has resolved.  Objective: Vital signs in last 24 hours: Temp:  [96.8 F (36 C)-98.5 F (36.9 C)] 97.7 F (36.5 C) (06/15 0806) Pulse Rate:  [63-122] 68 (06/15 0806) Resp:  [8-18] 18 (06/15 0806) BP: (105-133)/(52-82) 118/65 mmHg (06/15 0806) SpO2:  [97 %-100 %] 100 % (06/15 0806) Last BM Date: 04/09/15  Intake/Output from previous day: 06/14 0701 - 06/15 0700 In: 2552 [P.O.:250; I.V.:2272; IV Piggyback:30] Out: 900 [Urine:900] Intake/Output this shift:    Physical exam:   soft abdomen minimal tenderness wounds dressed calves nontender.   Lab Results: CBC   Recent Labs  04/09/15 2227 04/11/15 0620  WBC 8.0 14.4*  HGB 12.4 12.0  HCT 38.1 35.9  PLT 244 244   BMET  Recent Labs  04/09/15 2227 04/11/15 0620  NA 139 139  K 3.7 3.7  CL 105 106  CO2 25 26  GLUCOSE 101* 182*  BUN 12 8  CREATININE 0.85 0.75  CALCIUM 9.4 9.0   PT/INR No results for input(s): LABPROT, INR in the last 72 hours. ABG No results for input(s): PHART, HCO3 in the last 72 hours.  Invalid input(s): PCO2, PO2  Studies/Results: Dg Cholangiogram Operative  04/10/2015   CLINICAL DATA:  Cholelithiasis.  EXAM: INTRAOPERATIVE CHOLANGIOGRAM  TECHNIQUE: Cholangiographic images from the C-arm fluoroscopic device were submitted for interpretation post-operatively. Please see the procedural report for the amount of contrast and the fluoroscopy time utilized.  COMPARISON:  Ultrasound of April 09, 2015.  FLUOROSCOPY TIME:  19 seconds.  FINDINGS: Three intraoperative fluoroscopic images were submitted for review. These images demonstrate contrast injected through cannulated cystic duct remnant. Dilatation of the common bile duct is noted with mild intrahepatic biliary dilatation. No extravasation or filling defect is noted. Filling of the duodenum is not visualized  as it is not included in the field of view.  IMPRESSION: No filling defect is seen within the common bile duct to suggest residual stone, although common bile duct does appear to be dilated.   Electronically Signed   By: Lupita Raider, M.D.   On: 04/10/2015 15:37   US Abdomen Limited Ruq  04/10/2015   CLINICAL DATA:  Acute onset of right upper quadrant abdominal pain. Initial encounter.  EXAM: US ABDOMEN LIMITED - RIGHT UPPER QUADRANT  COMPARISON:  None.  FINDINGS: Gallbladder:  The gallbladder is mildly distended. There is a large 1.7 cm stone lodged at the neck of the gallbladder. A positive ultrasonographic Murphy's sign is elicited. No gallbladder wall thickening or pericholecystic fluid is seen.  Common bile duct:  Diameter: 0.8 cm, dilated.  Liver:  No focal lesion identified. Within normal limits in parenchymal echogenicity.  IMPRESSION: 1. Gallbladder mildly distended. Large 1.7 cm stone lodged at the neck of the gallbladder. Positive ultrasonographic Murphy's sign elicited. This raises concern for obstruction due to the stone at the gallbladder neck. 2. Dilatation of the common bile duct also noted, to 0.8 cm. This raises concern for distal obstruction. Would consider MRCP or ERCP for further evaluation, as deemed clinically appropriate.   Electronically Signed   By: Roanna Raider M.D.   On: 04/10/2015 00:52    Anti-infectives: Anti-infectives    Start     Dose/Rate Route Frequency Ordered Stop   04/10/15 0145  piperacillin-tazobactam (ZOSYN) IVPB 3.375 g     3.375 g 12.5 mL/hr over 240 Minutes Intravenous 3 times  per day 04/10/15 0135        Assessment/Plan: s/p Procedure(s): LAPAROSCOPIC CHOLECYSTECTOMY WITH INTRAOPERATIVE CHOLANGIOGRAM   Pt doing much better today. Nausea resolved. AST/ALT still sltly elevated but nml cholangiogram. Adv diet, home later today  Lattie Haw, MD, FACS  04/11/2015

## 2015-04-12 LAB — SURGICAL PATHOLOGY

## 2015-04-17 ENCOUNTER — Telehealth: Payer: Self-pay | Admitting: Surgery

## 2015-04-17 ENCOUNTER — Other Ambulatory Visit: Payer: Self-pay

## 2015-04-17 DIAGNOSIS — R110 Nausea: Secondary | ICD-10-CM

## 2015-04-17 MED ORDER — PROMETHAZINE HCL 25 MG PO TABS: 25.0000 mg | ORAL_TABLET | Freq: Four times a day (QID) | ORAL | Status: DC | PRN

## 2015-04-17 NOTE — Telephone Encounter (Signed)
Please call pt, She is still having some problems Post Gallbladder sx

## 2015-04-17 NOTE — Telephone Encounter (Signed)
Called patient back to ask how she was doing. Patient stated that she had surgery last week on Tuesday 04/10/2015 (Lap Chole). Patient stated that she has been having some nausea, vomiting, loose stools and no appetite. Patient stated that she has lost 9lbs since last week. The only thing that she is able to hold down is bread.  Patient stated that she has some right abdominal pain and right breast pain since last night. However, I asked patient if she had done anything to irritate her incision area. Patient stated that she over did it yesterday in cleaning her house by sweeping and mopping. Patient was told to stop cleaning her house and not to do any more strenuous work around her house. I told her that if she developed a fever and her pain got worse to go to the ED. However, we will send her a prescription for her nausea and vomiting for her to pick up.  I also told her that we would see her on Thursday 04/19/2015. Patient understood.

## 2015-04-19 ENCOUNTER — Ambulatory Visit (INDEPENDENT_AMBULATORY_CARE_PROVIDER_SITE_OTHER): Payer: BLUE CROSS/BLUE SHIELD | Admitting: Surgery

## 2015-04-19 ENCOUNTER — Encounter: Payer: Self-pay | Admitting: Surgery

## 2015-04-19 ENCOUNTER — Ambulatory Visit: Payer: Self-pay | Admitting: Surgery

## 2015-04-19 VITALS — BP 118/67 | HR 70 | Temp 98.2°F | Resp 20 | Ht 62.0 in | Wt 147.0 lb

## 2015-04-19 DIAGNOSIS — K8000 Calculus of gallbladder with acute cholecystitis without obstruction: Secondary | ICD-10-CM

## 2015-04-19 NOTE — Progress Notes (Signed)
Outpatient Surgical Follow Up  04/19/2015  Christine Simmons is an 39 y.o. female.   Chief Complaint  Patient presents with  . Routine Post Op    cholecystectomy    HPI: She returns following her laparoscopic cholecystectomy. She has been doing quite well with no major problems. Her nausea has improved and he she's had no further abdominal pain. She continues to have mild back pain. She is also having some loose stool which is a change from previous surgery.  Past Medical History  Diagnosis Date  . Back pain of lumbar region with sciatica     Past Surgical History  Procedure Laterality Date  . Cesarean section    . Cholecystectomy N/A 04/10/2015    Procedure: LAPAROSCOPIC CHOLECYSTECTOMY WITH INTRAOPERATIVE CHOLANGIOGRAM;  Surgeon: Lattie Haw, MD;  Location: [facility] ORS;  Service: General;  Laterality: N/A;    Family History  Problem Relation Age of Onset  . Hypertension Mother   . Gallbladder disease Maternal Grandmother     Social History:  reports that she quit smoking about 18 years ago. She has never used smokeless tobacco. She reports that she does not drink alcohol or use illicit drugs.  Allergies:  Allergies  Allergen Reactions  . Sulfa Antibiotics Nausea And Vomiting, Nausea Only and Rash    Other reaction(s): Vomiting Hallucinations    Medications reviewed.    ROS    BP 118/67 mmHg  Pulse 70  Temp(Src) 98.2 F (36.8 C) (Oral)  Resp 20  Ht 5\' 2"  (1.575 m)  Wt 147 lb (66.679 kg)  BMI 26.88 kg/m2  LMP 04/09/2015  Physical Exam her wounds look good. There is no sign of any infection. She has minimal bruising. She has no rebound or guarding.     No results found for this or any previous visit (from the past 48 hour(s)). No results found.  Assessment/Plan: We discussed her continued exercise limitations. We will allow her to return to work in 10 days. We cautioned her against any heavy lifting for another 2 weeks. We did not refill her pain  medication at the present time. We will see her back again as necessary.  There are no diagnoses linked to this encounter.    Tiney Rouge III  04/19/2015,negative

## 2015-10-05 ENCOUNTER — Ambulatory Visit: Payer: BLUE CROSS/BLUE SHIELD | Admitting: Family Medicine

## 2015-12-27 ENCOUNTER — Encounter: Payer: Self-pay | Admitting: *Deleted

## 2015-12-27 ENCOUNTER — Emergency Department
Admission: EM | Admit: 2015-12-27 | Discharge: 2015-12-27 | Disposition: A | Discharge status: Home / Self Care | Payer: BLUE CROSS/BLUE SHIELD | Attending: Emergency Medicine | Admitting: Emergency Medicine

## 2015-12-27 ENCOUNTER — Emergency Department: Payer: BLUE CROSS/BLUE SHIELD

## 2015-12-27 DIAGNOSIS — Z3202 Encounter for pregnancy test, result negative: Secondary | ICD-10-CM | POA: Insufficient documentation

## 2015-12-27 DIAGNOSIS — Z87891 Personal history of nicotine dependence: Secondary | ICD-10-CM | POA: Insufficient documentation

## 2015-12-27 DIAGNOSIS — M461 Sacroiliitis, not elsewhere classified: Secondary | ICD-10-CM

## 2015-12-27 DIAGNOSIS — N83209 Unspecified ovarian cyst, unspecified side: Secondary | ICD-10-CM

## 2015-12-27 DIAGNOSIS — R112 Nausea with vomiting, unspecified: Secondary | ICD-10-CM

## 2015-12-27 DIAGNOSIS — R109 Unspecified abdominal pain: Secondary | ICD-10-CM

## 2015-12-27 DIAGNOSIS — R1031 Right lower quadrant pain: Secondary | ICD-10-CM | POA: Diagnosis present

## 2015-12-27 DIAGNOSIS — Z79899 Other long term (current) drug therapy: Secondary | ICD-10-CM | POA: Diagnosis not present

## 2015-12-27 LAB — URINALYSIS COMPLETE WITH MICROSCOPIC (ARMC ONLY)
BACTERIA UA: NONE SEEN
BILIRUBIN URINE: NEGATIVE
GLUCOSE, UA: NEGATIVE mg/dL
HGB URINE DIPSTICK: NEGATIVE
KETONES UR: NEGATIVE mg/dL
LEUKOCYTES UA: NEGATIVE
Nitrite: NEGATIVE
Protein, ur: NEGATIVE mg/dL
RBC / HPF: NONE SEEN RBC/hpf (ref 0–5)
Specific Gravity, Urine: 1.018 (ref 1.005–1.030)
pH: 6 (ref 5.0–8.0)

## 2015-12-27 LAB — COMPREHENSIVE METABOLIC PANEL
ALT: 26 U/L (ref 14–54)
AST: 26 U/L (ref 15–41)
Albumin: 4.4 g/dL (ref 3.5–5.0)
Alkaline Phosphatase: 52 U/L (ref 38–126)
Anion gap: 6 (ref 5–15)
BUN: 15 mg/dL (ref 6–20)
CO2: 25 mmol/L (ref 22–32)
Calcium: 9.2 mg/dL (ref 8.9–10.3)
Chloride: 107 mmol/L (ref 101–111)
Creatinine, Ser: 0.7 mg/dL (ref 0.44–1.00)
GFR calc Af Amer: >60 mL/min (ref >60)
Glucose, Bld: 95 mg/dL (ref 65–99)
POTASSIUM: 4 mmol/L (ref 3.5–5.1)
Sodium: 138 mmol/L (ref 135–145)
Total Bilirubin: 1.1 mg/dL (ref 0.3–1.2)
Total Protein: 7.7 g/dL (ref 6.5–8.1)

## 2015-12-27 LAB — LIPASE, BLOOD: Lipase: 15 U/L (ref 11–51)

## 2015-12-27 LAB — CBC
HEMATOCRIT: 35.9 % (ref 35.0–47.0)
Hemoglobin: 11.9 g/dL — ABNORMAL LOW (ref 12.0–16.0)
MCH: 26.8 pg (ref 26.0–34.0)
MCHC: 33.2 g/dL (ref 32.0–36.0)
MCV: 80.7 fL (ref 80.0–100.0)
Platelets: 242 10*3/uL (ref 150–440)
RBC: 4.45 MIL/uL (ref 3.80–5.20)
RDW: 14.5 % (ref 11.5–14.5)
WBC: 5.7 10*3/uL (ref 3.6–11.0)

## 2015-12-27 LAB — POCT PREGNANCY, URINE: Preg Test, Ur: NEGATIVE

## 2015-12-27 IMAGING — CT CT ABD-PELV W/ CM
1 of 2 series · 14 of 32 positions shown, 18 images · IV contrast (omnipaque)
Comparison: [DATE]

ADDENDUM:
The first sentence in the Impression should read, Evidence of recent
right ovarian cyst rupture with fluid tracking from the right ovary
into the cul-de-sac.
CLINICAL DATA: Lower abdominal pain, progressing.  Vomiting.

EXAM:
CT ABDOMEN AND PELVIS WITH CONTRAST
TECHNIQUE: Multidetector CT imaging of the abdomen and pelvis was performed
using the standard protocol following bolus administration of
intravenous contrast. Oral contrast was also administered.
CONTRAST:  100mL OMNIPAQUE IOHEXOL 300 MG/ML  SOLN

[Series 2: routine abd pel with · axial · 0.76mm/px · z∈[-1134,-674]mm · 14 of 102 slices shown, 18 images]
[im 5/102  soft-tissue]
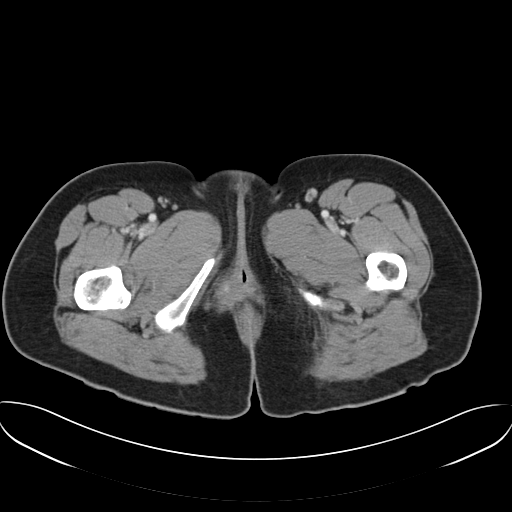
[im 5/102  bone]
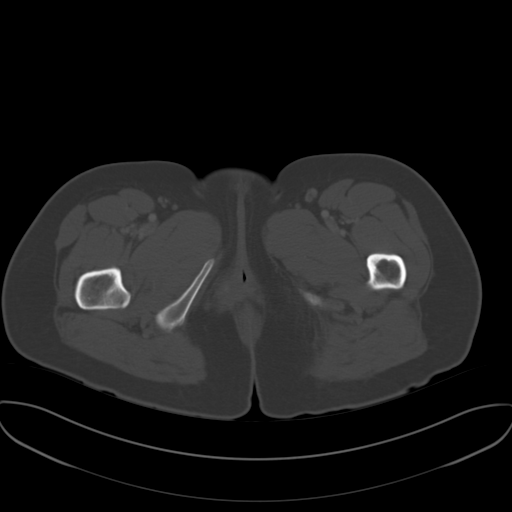
[im 13/102  soft-tissue]
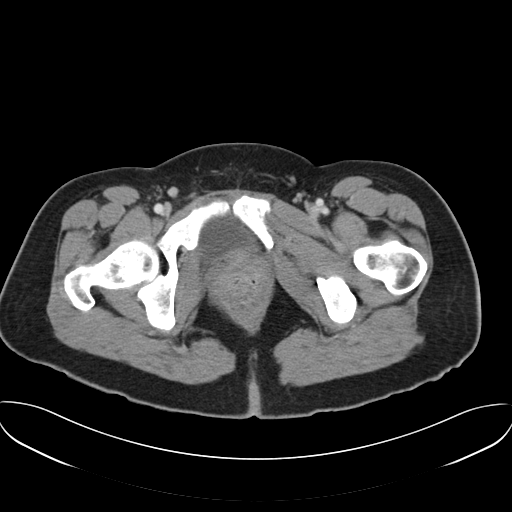
[im 22/102  soft-tissue]
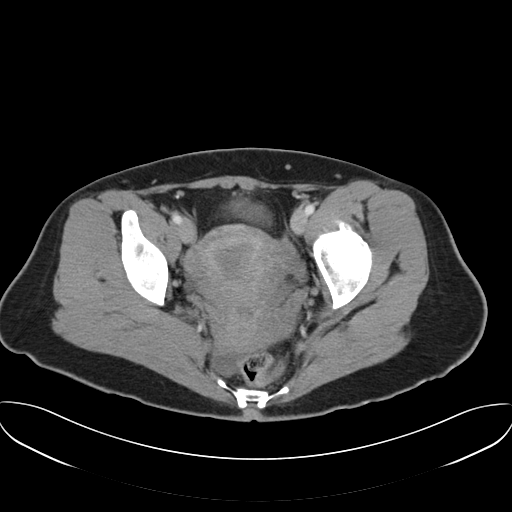
[im 30/102  soft-tissue]
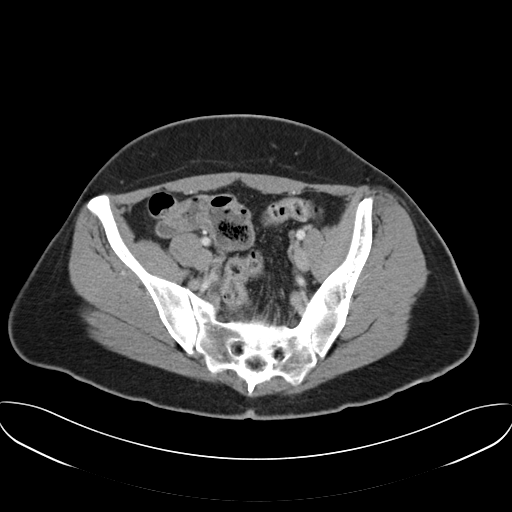
[im 38/102  soft-tissue]
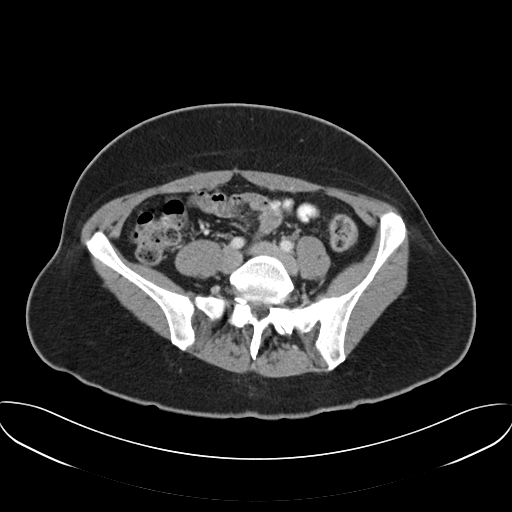
[im 47/102  soft-tissue]
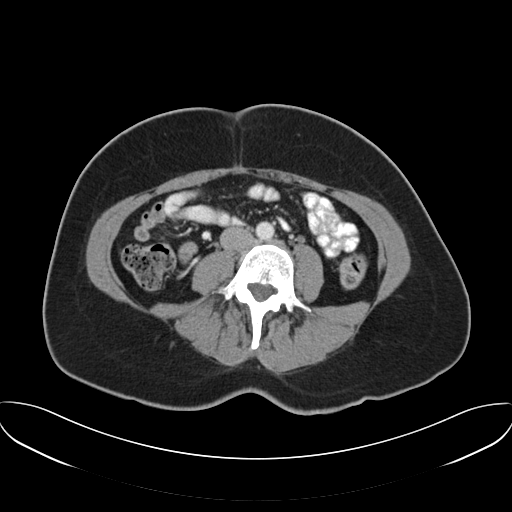
[im 55/102  soft-tissue]
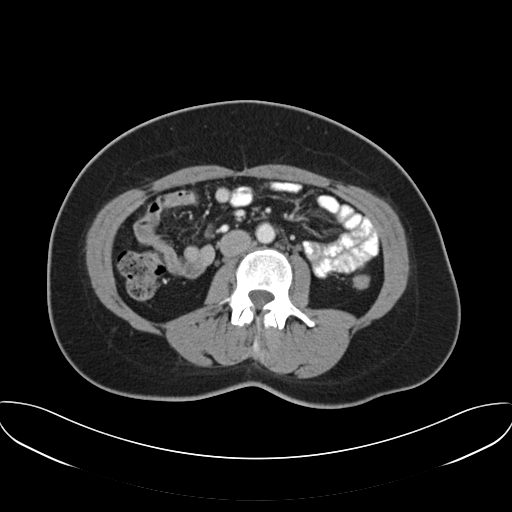
[im 64/102  soft-tissue]
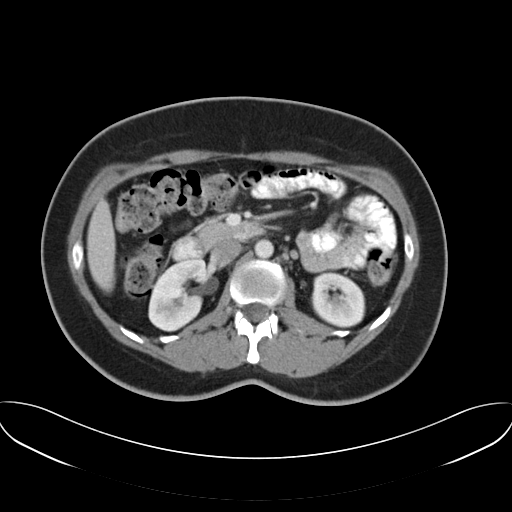
[im 72/102  soft-tissue]
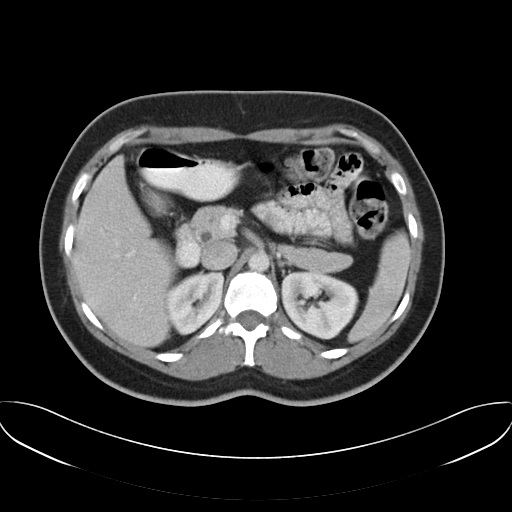
[im 72/102  bone]
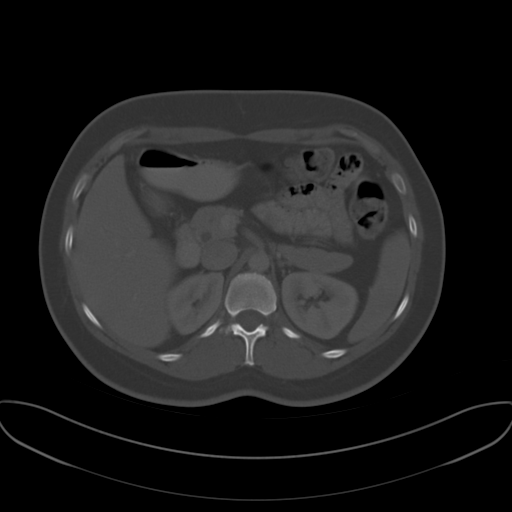
[im 80/102  soft-tissue]
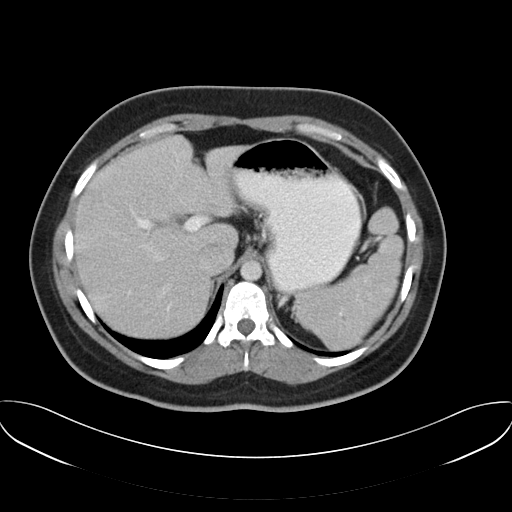
[im 85/102  lung]
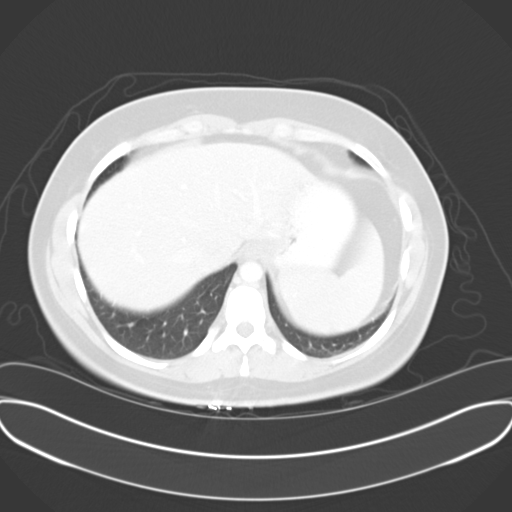
[im 89/102  soft-tissue]
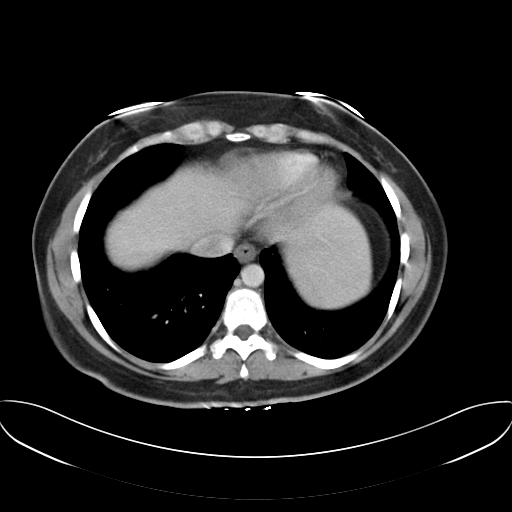
[im 89/102  lung]
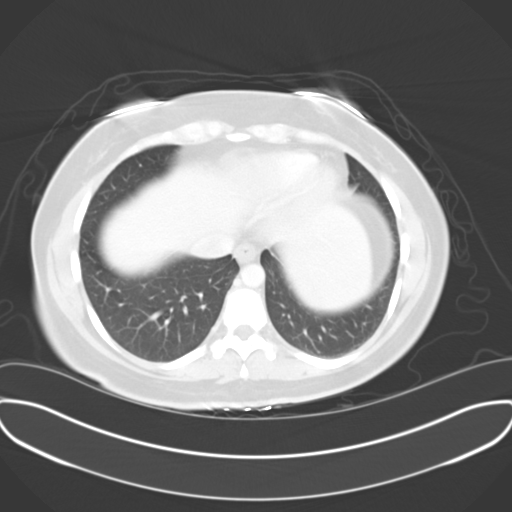
[im 93/102  lung]
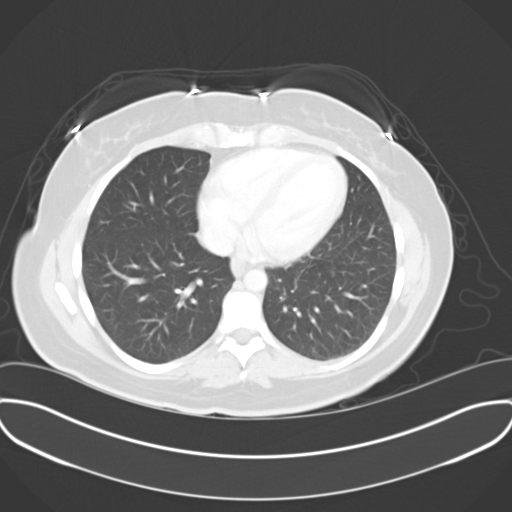
[im 97/102  soft-tissue]
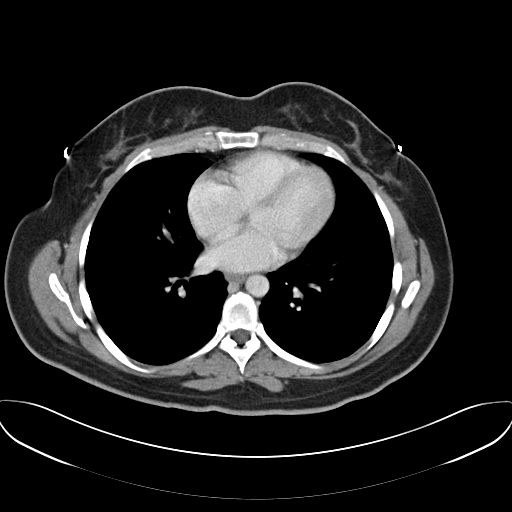
[im 97/102  lung]
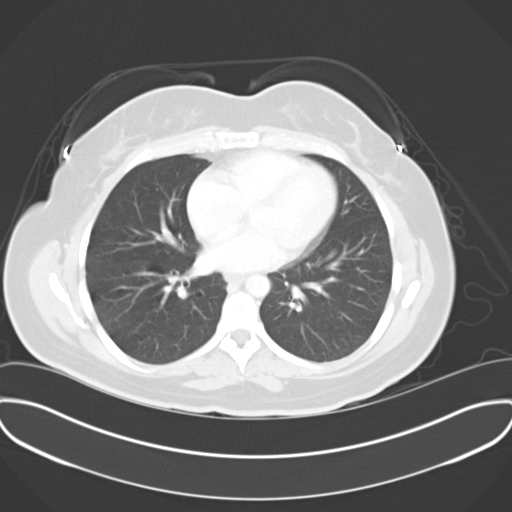

[14 of 32 positions shown; findings below may reference images not displayed]

FINDINGS: Lower chest:  Lung bases are clear.

Hepatobiliary: No focal liver lesions are apparent. Gallbladder is
absent. There is no appreciable biliary duct dilatation.

Pancreas: No pancreatic mass or inflammatory focus.

Spleen: No splenic lesions are identified.

Adrenals/Urinary Tract: Adrenals appear normal bilaterally. Kidneys
bilaterally show no evidence of mass or hydronephrosis on either
side. There is no renal or ureteral calculus on either side. Urinary
bladder is midline with wall thickness within normal limits.

Stomach/Bowel: There is wall thickening in the region of the pylorus
and proximal duodenum. No contrast extravasation or ulceration is
seen in this area. No other bowel wall thickening is seen on this
study. There is no mesenteric thickening. No bowel obstruction. No
free air or portal venous air.

Vascular/Lymphatic: There is no abdominal aortic aneurysm. No
vascular lesions are apparent. There is no demonstrable adenopathy
in the abdomen or pelvis.

Reproductive: Uterus is anteverted. There is evidence of a collapsed
cyst in the right ovary measuring 1.6 x 1.6 cm. Fluid surrounds this
collapsed cyst in tracks from the right ovary into the cul-de-sac
region.

Other: The appendix appears unremarkable. There is no abscess in the
abdomen or pelvis.

Musculoskeletal: There is lumbar levoscoliosis. There are no blastic
or lytic bone lesions. There is evidence of unilateral left
sacroiliitis with partial fusion of the left sacroiliac joint. The
right sacroiliac joint appears normal. There are no blastic or lytic
bone lesions. There are no syndesmophytic changes in the visualized
spine. There is no intramuscular or abdominal wall lesion.
IMPRESSION: Evidence of recent ovarian cyst with fluid tracking from the right
ovary into the cul-de-sac.

Wall thickening in the pylorus and proximal duodenum. Suspect distal
gastritis and proximal duodenitis. No contrast extravasation or
obvious ulcer apparent by CT in this region. No other bowel wall
thickening seen. No bowel obstruction.

Appendix appears normal.  No abscess.

No renal or ureteral calculus.  No hydronephrosis.

Gallbladder absent.

There is a degree of sacroiliitis on the left. Sacroiliac joint on
the right is normal. Question seronegative spondyloarthropathy as a
potential cause for sacroiliitis. Note that there is no
syndesmophytic change in the visualized spine.

## 2015-12-27 MED ORDER — ONDANSETRON HCL 4 MG PO TABS: 4.0000 mg | ORAL_TABLET | Freq: Every day | ORAL | Status: DC | PRN

## 2015-12-27 MED ORDER — OMEPRAZOLE 40 MG PO CPDR: 40.0000 mg | DELAYED_RELEASE_CAPSULE | Freq: Every day | ORAL | Status: DC

## 2015-12-27 MED ORDER — SODIUM CHLORIDE 0.9 % IV BOLUS (SEPSIS)
1000.0000 mL | Freq: Once | INTRAVENOUS | Status: AC
  Administered 2015-12-27: 1000 mL via INTRAVENOUS

## 2015-12-27 MED ORDER — DICYCLOMINE HCL 20 MG PO TABS: 20.0000 mg | ORAL_TABLET | Freq: Three times a day (TID) | ORAL | Status: DC | PRN

## 2015-12-27 MED ORDER — IOHEXOL 300 MG/ML  SOLN
100.0000 mL | Freq: Once | INTRAMUSCULAR | Status: AC | PRN
  Administered 2015-12-27: 100 mL via INTRAVENOUS
  Filled 2015-12-27: qty 100

## 2015-12-27 MED ORDER — IOHEXOL 240 MG/ML SOLN
25.0000 mL | Freq: Once | INTRAMUSCULAR | Status: AC | PRN
  Administered 2015-12-27: 25 mL via ORAL
  Filled 2015-12-27: qty 25

## 2015-12-27 NOTE — ED Notes (Signed)
Patient with no complaints at this time. Respirations even and unlabored. Skin warm/dry. Discharge instructions reviewed with patient at this time. Patient given opportunity to voice concerns/ask questions. IV removed per policy and band-aid applied to site. Patient discharged at this time and left Emergency Department with steady gait.  

## 2015-12-27 NOTE — ED Notes (Signed)
States RLQ abd pain for several months but states last night when she urinated she had severe pain and vomiting, states sharp pain when she coughs or sneezes, states increased urination frequency

## 2015-12-27 NOTE — Discharge Instructions (Signed)
Abdominal Pain, Adult °Many things can cause abdominal pain. Usually, abdominal pain is not caused by a disease and will improve without treatment. It can often be observed and treated at home. Your health care provider will do a physical exam and possibly order blood tests and X-rays to help determine the seriousness of your pain. However, in many cases, more time must pass before a clear cause of the pain can be found. Before that point, your health care provider may not know if you need more testing or further treatment. °HOME CARE INSTRUCTIONS °Monitor your abdominal pain for any changes. The following actions may help to alleviate any discomfort you are experiencing: °· Only take over-the-counter or prescription medicines as directed by your health care provider. °· Do not take laxatives unless directed to do so by your health care provider. °· Try a clear liquid diet (broth, tea, or water) as directed by your health care provider. Slowly move to a bland diet as tolerated. °SEEK MEDICAL CARE IF: °· You have unexplained abdominal pain. °· You have abdominal pain associated with nausea or diarrhea. °· You have pain when you urinate or have a bowel movement. °· You experience abdominal pain that wakes you in the night. °· You have abdominal pain that is worsened or improved by eating food. °· You have abdominal pain that is worsened with eating fatty foods. °· You have a fever. °SEEK IMMEDIATE MEDICAL CARE IF: °· Your pain does not go away within 2 hours. °· You keep throwing up (vomiting). °· Your pain is felt only in portions of the abdomen, such as the right side or the left lower portion of the abdomen. °· You pass bloody or black tarry stools. °MAKE SURE YOU: °· Understand these instructions. °· Will watch your condition. °· Will get help right away if you are not doing well or get worse. °  °This information is not intended to replace advice given to you by your health care provider. Make sure you discuss  any questions you have with your health care provider. °  °Document Released: 07/23/2005 Document Revised: 07/04/2015 Document Reviewed: 06/22/2013 °Elsevier Interactive Patient Education ©2016 [employer] ° °Nausea and Vomiting °Nausea is a sick feeling that often comes before throwing up (vomiting). Vomiting is a reflex where stomach contents come out of your mouth. Vomiting can cause severe loss of body fluids (dehydration). Children and elderly adults can become dehydrated quickly, especially if they also have diarrhea. Nausea and vomiting are symptoms of a condition or disease. It is important to find the cause of your symptoms. °CAUSES  °· Direct irritation of the stomach lining. This irritation can result from increased acid production (gastroesophageal reflux disease), infection, food poisoning, taking certain medicines (such as nonsteroidal anti-inflammatory drugs), alcohol use, or tobacco use. °· Signals from the brain. These signals could be caused by a headache, heat exposure, an inner ear disturbance, increased pressure in the brain from injury, infection, a tumor, or a concussion, pain, emotional stimulus, or metabolic problems. °· An obstruction in the gastrointestinal tract (bowel obstruction). °· Illnesses such as diabetes, hepatitis, gallbladder problems, appendicitis, kidney problems, cancer, sepsis, atypical symptoms of a heart attack, or eating disorders. °· Medical treatments such as chemotherapy and radiation. °· Receiving medicine that makes you sleep (general anesthetic) during surgery. °DIAGNOSIS °Your caregiver may ask for tests to be done if the problems do not improve after a few days. Tests may also be done if symptoms are severe or if the reason for the   nausea and vomiting is not clear. Tests may include: °· Urine tests. °· Blood tests. °· Stool tests. °· Cultures (to look for evidence of infection). °· X-rays or other imaging studies. °Test results can help your caregiver make  decisions about treatment or the need for additional tests. °TREATMENT °You need to stay well hydrated. Drink frequently but in small amounts. You may wish to drink water, sports drinks, clear broth, or eat frozen ice pops or gelatin dessert to help stay hydrated. When you eat, eating slowly may help prevent nausea. There are also some antinausea medicines that may help prevent nausea. °HOME CARE INSTRUCTIONS  °· Take all medicine as directed by your caregiver. °· If you do not have an appetite, do not force yourself to eat. However, you must continue to drink fluids. °· If you have an appetite, eat a normal diet unless your caregiver tells you differently. °¨ Eat a variety of complex carbohydrates (rice, wheat, potatoes, bread), lean meats, yogurt, fruits, and vegetables. °¨ Avoid high-fat foods because they are more difficult to digest. °· Drink enough water and fluids to keep your urine clear or pale yellow. °· If you are dehydrated, ask your caregiver for specific rehydration instructions. Signs of dehydration may include: °¨ Severe thirst. °¨ Dry lips and mouth. °¨ Dizziness. °¨ Dark urine. °¨ Decreasing urine frequency and amount. °¨ Confusion. °¨ Rapid breathing or pulse. °SEEK IMMEDIATE MEDICAL CARE IF:  °· You have blood or brown flecks (like coffee grounds) in your vomit. °· You have black or bloody stools. °· You have a severe headache or stiff neck. °· You are confused. °· You have severe abdominal pain. °· You have chest pain or trouble breathing. °· You do not urinate at least once every 8 hours. °· You develop cold or clammy skin. °· You continue to vomit for longer than 24 to 48 hours. °· You have a fever. °MAKE SURE YOU:  °· Understand these instructions. °· Will watch your condition. °· Will get help right away if you are not doing well or get worse. °  °This information is not intended to replace advice given to you by your health care provider. Make sure you discuss any questions you have with  your health care provider. °  °Document Released: 10/13/2005 Document Revised: 01/05/2012 Document Reviewed: 03/12/2011 °Elsevier Interactive Patient Education ©2016 [employer] ° °

## 2015-12-27 NOTE — ED Provider Notes (Signed)
[facility] Emergency Department Provider Note  ____________________________________________  Time seen: Approximately 3:00 PM  I have reviewed the triage vital signs and the nursing notes.   HISTORY  Chief Complaint Abdominal Pain    HPI Christine Simmons is a 40 y.o. female with a history of lumbar back pain with sciatica who is presenting today with right lower quadrant abdominal pain. She says that the pain is been ongoing since yesterday and waxing and waning. She says the pain is worse with coughing as well as urination. She denies any vaginal bleeding or discharge. No known history of ovarian cysts. Said that she had several episodes of vomiting and is still nauseous. Said that her vomitus was bilious. Denies any episodes of diarrhea. Says that the pain has abated at this time and is not radiating.   Past Medical History  Diagnosis Date  . Back pain of lumbar region with sciatica     Patient Active Problem List   Diagnosis Date Noted  . Cholelithiasis 04/10/2015    Past Surgical History  Procedure Laterality Date  . Cesarean section    . Cholecystectomy N/A 04/10/2015    Procedure: LAPAROSCOPIC CHOLECYSTECTOMY WITH INTRAOPERATIVE CHOLANGIOGRAM;  Surgeon: Lattie Haw, MD;  Location: [facility] ORS;  Service: General;  Laterality: N/A;    Current Outpatient Rx  Name  Route  Sig  Dispense  Refill  . metaxalone (SKELAXIN) 800 MG tablet   Oral   Take 800 mg by mouth.         . oxyCODONE-acetaminophen (ROXICET) 5-325 MG per tablet   Oral   Take 1 tablet by mouth every 4 (four) hours as needed for moderate pain.   35 tablet   0   . promethazine (PHENERGAN) 25 MG tablet   Oral   Take 1 tablet (25 mg total) by mouth every 6 (six) hours as needed for nausea or vomiting.   30 tablet   0     Allergies Sulfa antibiotics  Family History  Problem Relation Age of Onset  . Hypertension Mother   . Gallbladder disease Maternal Grandmother      Social History Social History  Substance Use Topics  . Smoking status: Former Smoker    Quit date: 04/09/1997  . Smokeless tobacco: Never Used  . Alcohol Use: No    Review of Systems Constitutional: No fever/chills Eyes: No visual changes. ENT: No sore throat. Cardiovascular: Denies chest pain. Respiratory: Denies shortness of breath. Gastrointestinal:  No diarrhea.  No constipation. Genitourinary: Negative for dysuria. Musculoskeletal: Negative for back pain. Skin: Negative for rash. Neurological: Negative for headaches, focal weakness or numbness.  10-point ROS otherwise negative.  ____________________________________________   PHYSICAL EXAM:  VITAL SIGNS: ED Triage Vitals  Enc Vitals Group     BP 12/27/15 1051 114/63 mmHg     Pulse Rate 12/27/15 1051 70     Resp 12/27/15 1051 18     Temp 12/27/15 1051 98.1 F (36.7 C)     Temp Source 12/27/15 1051 Oral     SpO2 12/27/15 1051 100 %     Weight 12/27/15 1051 145 lb (65.772 kg)     Height 12/27/15 1051 5\' 2"  (1.575 m)     Head Cir --      Peak Flow --      Pain Score 12/27/15 1052 3     Pain Loc --      Pain Edu? --      Excl. in GC? --  Constitutional: Alert and oriented. Well appearing and in no acute distress. Eyes: Conjunctivae are normal. PERRL. EOMI. Head: Atraumatic. Nose: No congestion/rhinnorhea. Mouth/Throat: Mucous membranes are moist.  Neck: No stridor.   Cardiovascular: Normal rate, regular rhythm. Grossly normal heart sounds.  Good peripheral circulation. Respiratory: Normal respiratory effort.  No retractions. Lungs CTAB. Gastrointestinal: Soft with mild tenderness to the left lower quadrant as well as right lower quadrant. There is no suprapubic tenderness to palpation. No distention. No abdominal bruits. No CVA tenderness. Musculoskeletal: No lower extremity tenderness nor edema.  No joint effusions. Neurologic:  Normal speech and language. No gross focal neurologic deficits are  appreciated.  Skin:  Skin is warm, dry and intact. No rash noted. Psychiatric: Mood and affect are normal. Speech and behavior are normal.  ____________________________________________   LABS (all labs ordered are listed, but only abnormal results are displayed)  Labs Reviewed  CBC - Abnormal; Notable for the following:    Hemoglobin 11.9 (*)    All other components within normal limits  URINALYSIS COMPLETEWITH MICROSCOPIC ([facility] ONLY) - Abnormal; Notable for the following:    Color, Urine YELLOW (*)    APPearance CLEAR (*)    Squamous Epithelial / LPF 6-30 (*)    All other components within normal limits  LIPASE, BLOOD  COMPREHENSIVE METABOLIC PANEL  POC URINE PREG, ED  POCT PREGNANCY, URINE   ____________________________________________  EKG   ____________________________________________  RADIOLOGY   IMPRESSION: Evidence of recent ovarian cyst with fluid tracking from the right ovary into the cul-de-sac.  Wall thickening in the pylorus and proximal duodenum. Suspect distal gastritis and proximal duodenitis. No contrast extravasation or obvious ulcer apparent by CT in this region. No other bowel wall thickening seen. No bowel obstruction.  Appendix appears normal. No abscess.  No renal or ureteral calculus. No hydronephrosis.  Gallbladder absent.  There is a degree of sacroiliitis on the left. Sacroiliac joint on the right is normal. Question seronegative spondyloarthropathy as a potential cause for sacroiliitis. Note that there is no syndesmophytic change in the visualized spine.   Electronically Signed By: Bretta Bang III M.D. On: 12/27/2015 15:16 ____________________________________________   PROCEDURES    ____________________________________________   INITIAL IMPRESSION / ASSESSMENT AND PLAN / ED COURSE  Pertinent labs & imaging results that were available during my care of the patient were reviewed by me and considered in my  medical decision making (see chart for details).  ----------------------------------------- 4:13 PM on 12/27/2015 -----------------------------------------  Patient resting without any distress at this time. Able to tolerate the by mouth contrast. No further episodes of vomiting in the emergency department. I reviewed the labs as well as the imaging results with the patient. She is aware of the ruptured ovarian cyst. I also discussed the case with the radiologist, Dr. Margarita Grizzle who does not see enlargement of the ovary or sign of torsion. The patient also has not had any severe bouts of pain in the emergency department. It is not seeming clinically either like a torsion. Likely ruptured cyst causing the pain. Also with inflammation in the distal portion of the stomach and duodenum. We'll discharge on a PPI. Patient already taking tramadol for pain at home. We'll also add Bentyl and Zofran. Patient given copy of her ultrasound report and has no point with her primary care doctor this coming week. She is wearing the sacroiliitis on the left side. She says she follows up with the [facility] and will call for follow-up appointment. ____________________________________________   FINAL CLINICAL IMPRESSION(S) /  ED DIAGNOSES  Abdominal pain. Ruptured ovarian cyst. Sacroiliitis. Nausea and vomiting.    Myrna Blazer, MD 12/27/15 [phone]

## 2016-07-02 NOTE — H&P (Signed)
Christine Simmons is a 40 y.o. female here forLAVH , bilateral salpingectomy , possible rt oophorectomy for chronic pelvic pain R>.L and dyspareunia and menorrhagia  Referred from [facility] for [facility] yr h/o of right pelvic pain . Intermittent comes on 4x/ week may last for a few minutes then dissipates . PAin 7+/10 . + dyspareunia To that same area . + menorrhagia with 6-7 days of bleeding and clot passage . . + dysmenorrhea . Secondary infertility And is not on contraception . Recent ED vist for RLQ pain and was thought to have a ruptured ovarian cyst. Wall thickening in the pylorus and the duodenum was noted and GI eval c/w ulcer or gastritis   EMBX : negative  Pap: neg  U/S + SIS - no pathology noted , no endometrial mass  Past Medical History:  has a past medical history of Chickenpox ( ); Constipation due to slow transit; Degenerative disc disease, cervical; and Ovarian cyst.  Past Surgical History:  has a past surgical history that includes Cesarean section; ankle surgery, left; and Cholecystectomy. Family History: family history includes Hypertension in her mother. Social History:  reports that she has quit smoking. She has never used smokeless tobacco. She reports that she does not drink alcohol or use illicit drugs. OB/GYN History:  OB History    Gravida Para Term Preterm AB Living   2 2 1  0 0 1   SAB TAB Ectopic Multiple Live Births   0 0 0 0 1      Allergies: is allergic to sulfa (sulfonamide antibiotics). Medications:  Current Outpatient Prescriptions:  .  dicyclomine (BENTYL) 20 mg tablet, 1 tablet., Disp: , Rfl:  .  docusate (COLACE) 100 MG capsule, Take 100 mg by mouth 2 (two) times daily., Disp: , Rfl:  .  omeprazole (PRILOSEC) 40 MG DR capsule, Take 1 capsule by mouth once daily., Disp: , Rfl:  .  ondansetron (ZOFRAN) 4 MG tablet, Take 1 tablet by mouth., Disp: , Rfl:   Review of Systems: General:                                          No fatigue or weight  loss Eyes:                                                         No vision changes Ears:                                                          No hearing difficulty Respiratory:                No cough or shortness of breath Pulmonary:                                      No asthma or shortness of breath Cardiovascular:  No chest pain, palpitations, dyspnea on exertion Gastrointestinal:                    No abdominal bloating, chronic diarrhea, constipations, masses, pain or hematochezia Genitourinary:                                 No hematuria, dysuria, abnormal vaginal discharge, pelvic pain, Menometrorrhagia Lymphatic:                                       No swollen lymph nodes Musculoskeletal:                   No muscle weakness Neurologic:                                      No extremity weakness, syncope, seizure disorder Psychiatric:                                      No history of depression, delusions or suicidal/homicidal ideation    Exam:      Vitals:   06/11/16 1513  BP: 112/78  Pulse: 78    Body mass index is 26.16 kg/(m^2).  WDWN white/  female in NAD   Lungs: CTA  CV : RRR without murmur   Breast: exam done in sitting and lying position : No dimpling or retraction, no dominant mass, no spontaneous discharge, no axillary adenopathy Neck:  no thyromegaly Abdomen: soft , no mass, normal active bowel sounds,  non-tender, no rebound tenderness Pelvic: tanner stage 5 ,  External genitalia: vulva /labia no lesions Urethra: no prolapse Vagina: normal physiologic d/c, adequate for LAVH  Cervix: no lesions, no cervical motion tenderness   Uterus: normal size shape and contour, non-tender Adnexa: no mass,  non-tender     Impression:   The primary encounter diagnosis was Menorrhagia with regular cycle. Diagnoses of Female pelvic pain, Dyspareunia, female, and Dysmenorrhea were also pertinent to this visit.    Plan:  I have  spoken with the patient regarding treatment options including expectant management, hormonal options, or surgical intervention. After a full discussion the pt elects to proceed with LAVH + bilat salpingectomy , and possible right oophorectomy  Benefits and risks to surgery: The proposed benefit of the surgery has been discussed with the patient. The possible risks include, but are not limited to: organ injury to the bowel , bladder, ureters, and major blood vessels and nerves. There is a possibility of additional surgeries resulting from these injuries. There is also the risk of blood transfusion and the need to receive blood products during or after the procedure which may rarely lead to HIV or Hepatitis C infection. There is a risk of developing a deep venous thrombosis or a pulmonary embolism . There is the possibility of wound infection and also anesthetic complications, even the rare possibility of death. The patient understands these risks and wishes to proceed. All questions have been answered and the consent has been signed.         Vilma PraderHOMAS JANSE Lou Irigoyen, MD

## 2016-07-03 ENCOUNTER — Encounter
Admission: RE | Admit: 2016-07-03 | Discharge: 2016-07-03 | Disposition: A | Discharge status: Home / Self Care | Payer: BLUE CROSS/BLUE SHIELD | Source: Ambulatory Visit | Attending: Obstetrics and Gynecology | Admitting: Obstetrics and Gynecology

## 2016-07-03 DIAGNOSIS — Z01812 Encounter for preprocedural laboratory examination: Secondary | ICD-10-CM | POA: Insufficient documentation

## 2016-07-03 DIAGNOSIS — N920 Excessive and frequent menstruation with regular cycle: Secondary | ICD-10-CM | POA: Diagnosis not present

## 2016-07-03 DIAGNOSIS — R102 Pelvic and perineal pain: Secondary | ICD-10-CM | POA: Diagnosis not present

## 2016-07-03 DIAGNOSIS — N941 Unspecified dyspareunia: Secondary | ICD-10-CM | POA: Diagnosis not present

## 2016-07-03 HISTORY — DX: Chronic kidney disease, unspecified: N18.9

## 2016-07-03 HISTORY — DX: Gastro-esophageal reflux disease without esophagitis: K21.9

## 2016-07-03 HISTORY — DX: Family history of other specified conditions: Z84.89

## 2016-07-03 HISTORY — DX: Unspecified osteoarthritis, unspecified site: M19.90

## 2016-07-03 LAB — CBC
HCT: 32.7 % — ABNORMAL LOW (ref 35.0–47.0)
Hemoglobin: 11.4 g/dL — ABNORMAL LOW (ref 12.0–16.0)
MCH: 28.4 pg (ref 26.0–34.0)
MCHC: 34.9 g/dL (ref 32.0–36.0)
MCV: 81.6 fL (ref 80.0–100.0)
PLATELETS: 214 10*3/uL (ref 150–440)
RBC: 4 MIL/uL (ref 3.80–5.20)
RDW: 13.8 % (ref 11.5–14.5)
WBC: 5.6 10*3/uL (ref 3.6–11.0)

## 2016-07-03 LAB — BASIC METABOLIC PANEL
Anion gap: 9 (ref 5–15)
BUN: 10 mg/dL (ref 6–20)
CALCIUM: 9.1 mg/dL (ref 8.9–10.3)
CHLORIDE: 106 mmol/L (ref 101–111)
CO2: 23 mmol/L (ref 22–32)
CREATININE: 0.63 mg/dL (ref 0.44–1.00)
Glucose, Bld: 89 mg/dL (ref 65–99)
Potassium: 3.7 mmol/L (ref 3.5–5.1)
SODIUM: 138 mmol/L (ref 135–145)

## 2016-07-03 LAB — TYPE AND SCREEN
ABO/RH(D): O POS
ANTIBODY SCREEN: NEGATIVE

## 2016-07-03 NOTE — Patient Instructions (Signed)
  Your procedure is scheduled on: Monday 07/07/16 Report to Day Surgery. To find out your arrival time please call [phone] between 1PM - 3PM on Friday 07/04/16.  Remember: Instructions that are not followed completely may result in serious medical risk, up to and including death, or upon the discretion of your surgeon and anesthesiologist your surgery may need to be rescheduled.    __x__ 1. Do not eat food or drink liquids after midnight. No gum chewing or hard candies.     __x__ 2. No Alcohol for 24 hours before or after surgery.   ____ 3. Do Not Smoke For 24 Hours Prior to Your Surgery.   ____ 4. Bring all medications with you on the day of surgery if instructed.    __x__ 5. Notify your doctor if there is any change in your medical condition     (cold, fever, infections).       Do not wear jewelry, make-up, hairpins, clips or nail polish.  Do not wear lotions, powders, or perfumes. You may wear deodorant.  Do not shave 48 hours prior to surgery. Men may shave face and neck.  Do not bring valuables to the hospital.    [facility] belongings or valuables.               Contacts, dentures or bridgework may not be worn into surgery.  Leave your suitcase in the car. After surgery it may be brought to your room.  For patients admitted to the hospital, discharge time is determined by your                treatment team.   Patients discharged the day of surgery will not be allowed to drive home.   Please read over the following fact sheets that you were given:   Surgical Site Infection Prevention   __x__ Take these medicines the morning of surgery with A SIP OF WATER:    1. Protonix (take extra dose the night before surgery)  2.   3.   4.  5.  6.  ____ Fleet Enema (as directed)   _x___ Use CHG Soap as directed  ____ Use inhalers on the day of surgery  ____ Stop metformin 2 days prior to surgery    ____ Take 1/2 of usual insulin dose the night  before surgery and none on the morning of surgery.   ____ Stop Coumadin/Plavix/aspirin on   _x___ Stop Anti-inflammatories on tylenol only   ____ Stop supplements until after surgery.    ____ Bring C-Pap to the hospital.

## 2016-07-04 NOTE — Pre-Procedure Instructions (Signed)
CBC results sent to Dr. Feliberto GottronSchermerhorn and Anesthesia for review.

## 2016-07-07 ENCOUNTER — Encounter: Payer: Self-pay | Admitting: *Deleted

## 2016-07-07 ENCOUNTER — Observation Stay
Admission: AD | Admit: 2016-07-07 | Discharge: 2016-07-08 | Disposition: A | Discharge status: Home / Self Care | Payer: BLUE CROSS/BLUE SHIELD | Source: Ambulatory Visit | Attending: Obstetrics and Gynecology | Admitting: Obstetrics and Gynecology

## 2016-07-07 ENCOUNTER — Ambulatory Visit: Payer: BLUE CROSS/BLUE SHIELD | Admitting: Anesthesiology

## 2016-07-07 ENCOUNTER — Encounter
Admission: AD | Disposition: A | Discharge status: Home / Self Care | Payer: Self-pay | Source: Ambulatory Visit | Attending: Obstetrics and Gynecology

## 2016-07-07 DIAGNOSIS — N83209 Unspecified ovarian cyst, unspecified side: Secondary | ICD-10-CM | POA: Diagnosis not present

## 2016-07-07 DIAGNOSIS — Z87442 Personal history of urinary calculi: Secondary | ICD-10-CM | POA: Insufficient documentation

## 2016-07-07 DIAGNOSIS — M5032 Other cervical disc degeneration, mid-cervical region, unspecified level: Secondary | ICD-10-CM | POA: Diagnosis not present

## 2016-07-07 DIAGNOSIS — R102 Pelvic and perineal pain: Secondary | ICD-10-CM | POA: Insufficient documentation

## 2016-07-07 DIAGNOSIS — N941 Unspecified dyspareunia: Secondary | ICD-10-CM | POA: Insufficient documentation

## 2016-07-07 DIAGNOSIS — Z8249 Family history of ischemic heart disease and other diseases of the circulatory system: Secondary | ICD-10-CM | POA: Insufficient documentation

## 2016-07-07 DIAGNOSIS — Z87891 Personal history of nicotine dependence: Secondary | ICD-10-CM | POA: Insufficient documentation

## 2016-07-07 DIAGNOSIS — G8929 Other chronic pain: Secondary | ICD-10-CM | POA: Diagnosis not present

## 2016-07-07 DIAGNOSIS — N920 Excessive and frequent menstruation with regular cycle: Secondary | ICD-10-CM | POA: Insufficient documentation

## 2016-07-07 DIAGNOSIS — N9489 Other specified conditions associated with female genital organs and menstrual cycle: Secondary | ICD-10-CM | POA: Insufficient documentation

## 2016-07-07 DIAGNOSIS — N801 Endometriosis of ovary: Secondary | ICD-10-CM | POA: Diagnosis not present

## 2016-07-07 DIAGNOSIS — Z8619 Personal history of other infectious and parasitic diseases: Secondary | ICD-10-CM | POA: Insufficient documentation

## 2016-07-07 DIAGNOSIS — N979 Female infertility, unspecified: Secondary | ICD-10-CM | POA: Insufficient documentation

## 2016-07-07 DIAGNOSIS — N838 Other noninflammatory disorders of ovary, fallopian tube and broad ligament: Secondary | ICD-10-CM | POA: Diagnosis not present

## 2016-07-07 DIAGNOSIS — Z9049 Acquired absence of other specified parts of digestive tract: Secondary | ICD-10-CM | POA: Diagnosis not present

## 2016-07-07 DIAGNOSIS — Z9889 Other specified postprocedural states: Secondary | ICD-10-CM | POA: Insufficient documentation

## 2016-07-07 DIAGNOSIS — Z79899 Other long term (current) drug therapy: Secondary | ICD-10-CM | POA: Diagnosis not present

## 2016-07-07 DIAGNOSIS — K219 Gastro-esophageal reflux disease without esophagitis: Secondary | ICD-10-CM | POA: Insufficient documentation

## 2016-07-07 DIAGNOSIS — N946 Dysmenorrhea, unspecified: Secondary | ICD-10-CM | POA: Diagnosis not present

## 2016-07-07 DIAGNOSIS — Z882 Allergy status to sulfonamides status: Secondary | ICD-10-CM | POA: Insufficient documentation

## 2016-07-07 HISTORY — PX: LAPAROSCOPIC VAGINAL HYSTERECTOMY WITH SALPINGECTOMY: SHX6680

## 2016-07-07 LAB — POCT PREGNANCY, URINE: PREG TEST UR: NEGATIVE

## 2016-07-07 SURGERY — HYSTERECTOMY, VAGINAL, LAPAROSCOPY-ASSISTED, WITH SALPINGECTOMY
Anesthesia: General | Laterality: Bilateral

## 2016-07-07 MED ORDER — ONDANSETRON HCL 4 MG/2ML IJ SOLN
4.0000 mg | Freq: Once | INTRAMUSCULAR | Status: AC | PRN
  Administered 2016-07-07: 4 mg via INTRAVENOUS

## 2016-07-07 MED ORDER — OXYCODONE-ACETAMINOPHEN 5-325 MG PO TABS: 1.0000 | ORAL_TABLET | ORAL | Status: DC | PRN

## 2016-07-07 MED ORDER — LIDOCAINE-EPINEPHRINE 1 %-1:100000 IJ SOLN
INTRAMUSCULAR | Status: DC | PRN
  Administered 2016-07-07: 10 mL

## 2016-07-07 MED ORDER — FAMOTIDINE 20 MG PO TABS
20.0000 mg | ORAL_TABLET | Freq: Once | ORAL | Status: AC
  Administered 2016-07-07: 20 mg via ORAL

## 2016-07-07 MED ORDER — CEFOXITIN SODIUM-DEXTROSE 2-2.2 GM-% IV SOLR (PREMIX)
2.0000 g | INTRAVENOUS | Status: AC
  Administered 2016-07-07: 2 g via INTRAVENOUS

## 2016-07-07 MED ORDER — BUPIVACAINE HCL 0.5 % IJ SOLN
INTRAMUSCULAR | Status: DC | PRN
  Administered 2016-07-07: 10 mL

## 2016-07-07 MED ORDER — LACTATED RINGERS IV SOLN: INTRAVENOUS | Status: DC

## 2016-07-07 MED ORDER — SUGAMMADEX SODIUM 200 MG/2ML IV SOLN
INTRAVENOUS | Status: DC | PRN
  Administered 2016-07-07: 200 mg via INTRAVENOUS

## 2016-07-07 MED ORDER — PROPOFOL 10 MG/ML IV BOLUS
INTRAVENOUS | Status: DC | PRN
  Administered 2016-07-07: 200 mg via INTRAVENOUS

## 2016-07-07 MED ORDER — FENTANYL CITRATE (PF) 100 MCG/2ML IJ SOLN
INTRAMUSCULAR | Status: AC
  Administered 2016-07-07: 25 ug via INTRAVENOUS
  Filled 2016-07-07: qty 2

## 2016-07-07 MED ORDER — ONDANSETRON HCL 4 MG/2ML IJ SOLN
4.0000 mg | Freq: Four times a day (QID) | INTRAMUSCULAR | Status: DC | PRN
  Administered 2016-07-07: 4 mg via INTRAVENOUS
  Filled 2016-07-07: qty 2

## 2016-07-07 MED ORDER — LIDOCAINE HCL (CARDIAC) 20 MG/ML IV SOLN
INTRAVENOUS | Status: DC | PRN
  Administered 2016-07-07: 100 mg via INTRAVENOUS

## 2016-07-07 MED ORDER — LIDOCAINE-EPINEPHRINE 1 %-1:100000 IJ SOLN
INTRAMUSCULAR | Status: AC
  Filled 2016-07-07: qty 1

## 2016-07-07 MED ORDER — ROCURONIUM BROMIDE 100 MG/10ML IV SOLN
INTRAVENOUS | Status: DC | PRN
  Administered 2016-07-07: 50 mg via INTRAVENOUS
  Administered 2016-07-07 (×2): 10 mg via INTRAVENOUS

## 2016-07-07 MED ORDER — KETOROLAC TROMETHAMINE 30 MG/ML IJ SOLN
30.0000 mg | Freq: Three times a day (TID) | INTRAMUSCULAR | Status: DC | PRN
  Administered 2016-07-07 – 2016-07-08 (×2): 30 mg via INTRAVENOUS
  Filled 2016-07-07 (×3): qty 1

## 2016-07-07 MED ORDER — MORPHINE SULFATE (PF) 2 MG/ML IV SOLN
1.0000 mg | INTRAVENOUS | Status: DC | PRN
  Administered 2016-07-07 (×2): 2 mg via INTRAVENOUS
  Filled 2016-07-07 (×2): qty 1

## 2016-07-07 MED ORDER — PROMETHAZINE HCL 25 MG/ML IJ SOLN
25.0000 mg | INTRAMUSCULAR | Status: DC | PRN
  Administered 2016-07-07 (×2): 25 mg via INTRAVENOUS
  Filled 2016-07-07 (×2): qty 1

## 2016-07-07 MED ORDER — ONDANSETRON HCL 4 MG/2ML IJ SOLN
INTRAMUSCULAR | Status: DC | PRN
  Administered 2016-07-07: 4 mg via INTRAVENOUS

## 2016-07-07 MED ORDER — FAMOTIDINE 20 MG PO TABS
ORAL_TABLET | ORAL | Status: AC
  Filled 2016-07-07: qty 1

## 2016-07-07 MED ORDER — FENTANYL CITRATE (PF) 100 MCG/2ML IJ SOLN
25.0000 ug | INTRAMUSCULAR | Status: DC | PRN
  Administered 2016-07-07 (×2): 25 ug via INTRAVENOUS

## 2016-07-07 MED ORDER — LACTATED RINGERS IV SOLN
INTRAVENOUS | Status: DC
  Administered 2016-07-08: 05:00:00 via INTRAVENOUS

## 2016-07-07 MED ORDER — MEPERIDINE HCL 50 MG PO TABS
100.0000 mg | ORAL_TABLET | ORAL | Status: DC | PRN
  Administered 2016-07-07: 100 mg via ORAL
  Filled 2016-07-07: qty 2

## 2016-07-07 MED ORDER — LACTATED RINGERS IV SOLN
INTRAVENOUS | Status: DC
  Administered 2016-07-07 (×2): via INTRAVENOUS

## 2016-07-07 MED ORDER — MIDAZOLAM HCL 2 MG/2ML IJ SOLN
INTRAMUSCULAR | Status: DC | PRN
  Administered 2016-07-07: 2 mg via INTRAVENOUS

## 2016-07-07 MED ORDER — BUPIVACAINE HCL (PF) 0.5 % IJ SOLN
INTRAMUSCULAR | Status: AC
  Filled 2016-07-07: qty 30

## 2016-07-07 MED ORDER — HYDROMORPHONE HCL 1 MG/ML IJ SOLN
INTRAMUSCULAR | Status: DC | PRN
  Administered 2016-07-07 (×2): 0.5 mg via INTRAVENOUS

## 2016-07-07 MED ORDER — ONDANSETRON HCL 4 MG PO TABS
4.0000 mg | ORAL_TABLET | Freq: Four times a day (QID) | ORAL | Status: DC | PRN
  Administered 2016-07-07: 4 mg via ORAL
  Filled 2016-07-07: qty 1

## 2016-07-07 MED ORDER — EPHEDRINE SULFATE 50 MG/ML IJ SOLN
INTRAMUSCULAR | Status: DC | PRN
  Administered 2016-07-07: 15 mg via INTRAVENOUS

## 2016-07-07 MED ORDER — CEFOXITIN SODIUM-DEXTROSE 2-2.2 GM-% IV SOLR (PREMIX)
INTRAVENOUS | Status: AC
  Filled 2016-07-07: qty 50

## 2016-07-07 MED ORDER — KETOROLAC TROMETHAMINE 30 MG/ML IJ SOLN
INTRAMUSCULAR | Status: DC | PRN
  Administered 2016-07-07: 30 mg via INTRAVENOUS

## 2016-07-07 MED ORDER — ONDANSETRON HCL 4 MG/2ML IJ SOLN
INTRAMUSCULAR | Status: AC
  Filled 2016-07-07: qty 2

## 2016-07-07 MED ORDER — DEXAMETHASONE SODIUM PHOSPHATE 10 MG/ML IJ SOLN
INTRAMUSCULAR | Status: DC | PRN
  Administered 2016-07-07: 8 mg via INTRAVENOUS

## 2016-07-07 SURGICAL SUPPLY — 40 items
BAG URO DRAIN 2000ML W/SPOUT (MISCELLANEOUS) ×3 IMPLANT
BLADE SURG SZ11 CARB STEEL (BLADE) ×3 IMPLANT
CANISTER SUCT 1200ML W/VALVE (MISCELLANEOUS) ×3 IMPLANT
CATH ROBINSON RED A/P 16FR (CATHETERS) ×3 IMPLANT
CHLORAPREP W/TINT 26ML (MISCELLANEOUS) ×3 IMPLANT
DRSG TEGADERM 2-3/8X2-3/4 SM (GAUZE/BANDAGES/DRESSINGS) ×7 IMPLANT
ENDOPOUCH RETRIEVER 10 (MISCELLANEOUS) ×1 IMPLANT
GAUZE SPONGE NON-WVN 2X2 STRL (MISCELLANEOUS) ×3 IMPLANT
GLOVE BIO SURGEON STRL SZ8 (GLOVE) ×10 IMPLANT
GOWN STRL REUS W/ TWL LRG LVL3 (GOWN DISPOSABLE) ×4 IMPLANT
GOWN STRL REUS W/ TWL XL LVL3 (GOWN DISPOSABLE) ×2 IMPLANT
GOWN STRL REUS W/TWL LRG LVL3 (GOWN DISPOSABLE) ×6
GOWN STRL REUS W/TWL XL LVL3 (GOWN DISPOSABLE) ×3
IRRIGATION STRYKERFLOW (MISCELLANEOUS) ×2 IMPLANT
IRRIGATOR STRYKERFLOW (MISCELLANEOUS) ×3
IV NS 1000ML (IV SOLUTION) ×3
IV NS 1000ML BAXH (IV SOLUTION) ×2 IMPLANT
KIT RM TURNOVER CYSTO AR (KITS) ×3 IMPLANT
LABEL OR SOLS (LABEL) ×1 IMPLANT
LIQUID BAND (GAUZE/BANDAGES/DRESSINGS) ×2 IMPLANT
NS IRRIG 500ML POUR BTL (IV SOLUTION) ×5 IMPLANT
PACK GYN LAPAROSCOPIC (MISCELLANEOUS) ×3 IMPLANT
PAD OB MATERNITY 4.3X12.25 (PERSONAL CARE ITEMS) ×3 IMPLANT
PAD PREP 24X41 OB/GYN DISP (PERSONAL CARE ITEMS) ×3 IMPLANT
SCISSORS METZENBAUM CVD 33 (INSTRUMENTS) ×2 IMPLANT
SHEARS HARMONIC ACE PLUS 36CM (ENDOMECHANICALS) ×3 IMPLANT
SPONGE VERSALON 2X2 STRL (MISCELLANEOUS) ×6
STRIP CLOSURE SKIN 1/4X4 (GAUZE/BANDAGES/DRESSINGS) ×3 IMPLANT
SUT VIC AB 0 CT1 27 (SUTURE) ×6
SUT VIC AB 0 CT1 27XCR 8 STRN (SUTURE) ×2 IMPLANT
SUT VIC AB 2-0 UR6 27 (SUTURE) ×5 IMPLANT
SUT VIC AB 3-0 SH 27 (SUTURE) ×3
SUT VIC AB 3-0 SH 27X BRD (SUTURE) ×1 IMPLANT
SUT VIC AB 4-0 SH 27 (SUTURE) ×3
SUT VIC AB 4-0 SH 27XANBCTRL (SUTURE) ×2 IMPLANT
SWABSTK COMLB BENZOIN TINCTURE (MISCELLANEOUS) ×3 IMPLANT
TROCAR ENDO BLADELESS 11MM (ENDOMECHANICALS) ×3 IMPLANT
TROCAR XCEL NON-BLD 5MMX100MML (ENDOMECHANICALS) ×5 IMPLANT
TROCAR XCEL UNIV SLVE 11M 100M (ENDOMECHANICALS) ×1 IMPLANT
TUBING INSUFFLATOR HI FLOW (MISCELLANEOUS) ×3 IMPLANT

## 2016-07-07 NOTE — Anesthesia Postprocedure Evaluation (Signed)
Anesthesia Post Note  Patient: Christine Simmons  Procedure(s) Performed: Procedure(s) (LRB): LAPAROSCOPIC ASSISTED VAGINAL HYSTERECTOMY WITH SALPINGECTOMY (Bilateral)  Patient location during evaluation: PACU Anesthesia Type: General Level of consciousness: awake and alert Pain management: pain level controlled Vital Signs Assessment: post-procedure vital signs reviewed and stable Respiratory status: spontaneous breathing and respiratory function stable Cardiovascular status: stable Anesthetic complications: no    Last Vitals:  Vitals:   07/07/16 0613 07/07/16 1030  BP: 110/62 118/61  Pulse: 71 79  Resp: 16 10  Temp: 36.3 C 36.7 C    Last Pain:  Vitals:   07/07/16 1053  TempSrc:   PainSc: 7                  KEPHART,WILLIAM K

## 2016-07-07 NOTE — Brief Op Note (Signed)
07/07/2016  10:17 AM  PATIENT:  Christine ShiversAnita L Simmons  40 y.o. female  PRE-OPERATIVE DIAGNOSIS:  Mennorrhagia  Chronic Pelvic Pain  POST-OPERATIVE DIAGNOSIS:  Mennorrhagia  Chronic Pelvic Pain, endometriosis  PROCEDURE:  Procedure(s): LAPAROSCOPIC ASSISTED VAGINAL HYSTERECTOMY WITH SALPINGECTOMY (Bilateral) Peritoneal biopsy , excision of endometriosis SURGEON:  Surgeon(s) and Role:    * Suzy Bouchardhomas J Schermerhorn, MD - Primary    * Christeen DouglasBethany Beasley, MD  PHYSICIAN ASSISTANT: pa student jessup   ASSISTANTS: none   ANESTHESIA:   general  EBL:  Total I/O In: 1000 [I.V.:1000] Out: 100 [Urine:50; Blood:50]  BLOOD ADMINISTERED:none  DRAINS: Urinary Catheter (Foley)   LOCAL MEDICATIONS USED:  MARCAINE     SPECIMEN:  Source of Specimen:  fallopian tubes , uterus , cervix and peritonel biopsies  DISPOSITION OF SPECIMEN:  PATHOLOGY  COUNTS:  YES  TOURNIQUET:  * No tourniquets in log *  DICTATION: .Other Dictation: Dictation Number verbal  PLAN OF CARE: Admit for overnight observation  PATIENT DISPOSITION:  PACU - hemodynamically stable.   Delay start of Pharmacological VTE agent (>24hrs) due to surgical blood loss or risk of bleeding: not applicable

## 2016-07-07 NOTE — OR Nursing (Signed)
Patient had some nausea med given  No nausea now

## 2016-07-07 NOTE — Progress Notes (Signed)
Patient ID: Christine Simmons, female   DOB: 10/22/1976, 40 y.o.   MRN: [id number] DOS , doing well  No problems except foley is irritating   Afraid to take the percocet because it cause significant n/v .  Urine output is good  Po intake adequate  abd soft  and non distended D/c foley  Labs in am

## 2016-07-07 NOTE — Progress Notes (Signed)
Pt ready for surgery LAVH , bilat salpingectomy . Possible rt oophorectomy . CPP right > left .  Neg HCG . Labs negative .  All questions answered .

## 2016-07-07 NOTE — Anesthesia Preprocedure Evaluation (Signed)
Anesthesia Evaluation  Patient identified by MRN, date of birth, ID band Patient awake    Reviewed: Allergy & Precautions, NPO status , Patient's Chart, lab work & pertinent test results  History of Anesthesia Complications (+) Family history of anesthesia reactionNegative for: history of anesthetic complications (problems with mom getting to sleep)  Airway Mallampati: II       Dental   Pulmonary neg pulmonary ROS, former smoker,           Cardiovascular negative cardio ROS       Neuro/Psych negative neurological ROS     GI/Hepatic Neg liver ROS, GERD  Medicated and Controlled,  Endo/Other  negative endocrine ROS  Renal/GU Renal disease (stones)     Musculoskeletal  (+) Arthritis , Osteoarthritis,    Abdominal   Peds  Hematology negative hematology ROS (+)   Anesthesia Other Findings   Reproductive/Obstetrics                             Anesthesia Physical Anesthesia Plan  ASA: II  Anesthesia Plan: General   Post-op Pain Management:    Induction: Intravenous  Airway Management Planned: Oral ETT  Additional Equipment:   Intra-op Plan:   Post-operative Plan:   Informed Consent: I have reviewed the patients History and Physical, chart, labs and discussed the procedure including the risks, benefits and alternatives for the proposed anesthesia with the patient or authorized representative who has indicated his/her understanding and acceptance.     Plan Discussed with:   Anesthesia Plan Comments:         Anesthesia Quick Evaluation

## 2016-07-07 NOTE — Anesthesia Procedure Notes (Signed)
Procedure Name: Intubation Date/Time: 07/07/2016 7:43 AM Performed by: Marlana SalvageJESSUP, Christine Groh Pre-anesthesia Checklist: Patient identified, Emergency Drugs available, Suction available and Patient being monitored Patient Re-evaluated:Patient Re-evaluated prior to inductionOxygen Delivery Method: Circle system utilized Preoxygenation: Pre-oxygenation with 100% oxygen Intubation Type: IV induction and Cricoid Pressure applied Ventilation: Mask ventilation without difficulty Laryngoscope Size: Mac and 3 Grade View: Grade III Tube type: Oral Tube size: 7.0 mm Number of attempts: 1 Airway Equipment and Method: Stylet Placement Confirmation: ETT inserted through vocal cords under direct vision and positive ETCO2 Secured at: 22 cm Tube secured with: Tape Dental Injury: Teeth and Oropharynx as per pre-operative assessment

## 2016-07-07 NOTE — Transfer of Care (Signed)
Immediate Anesthesia Transfer of Care Note  Patient: Christine Simmons  Procedure(s) Performed: Procedure(s): LAPAROSCOPIC ASSISTED VAGINAL HYSTERECTOMY WITH SALPINGECTOMY (Bilateral)  Patient Location: PACU  Anesthesia Type:General  Level of Consciousness: sedated  Airway & Oxygen Therapy: Patient Spontanous Breathing and Patient connected to nasal cannula oxygen  Post-op Assessment: Report given to RN and Post -op Vital signs reviewed and stable  Post vital signs: Reviewed and stable  Last Vitals:  Vitals:   07/07/16 0613 07/07/16 1030  BP: 110/62   Pulse: 71   Resp: 16   Temp: 36.3 C (P) 36.7 C    Last Pain:  Vitals:   07/07/16 0613  TempSrc: Tympanic         Complications: No apparent anesthesia complications

## 2016-07-08 ENCOUNTER — Encounter: Payer: Self-pay | Admitting: Obstetrics and Gynecology

## 2016-07-08 DIAGNOSIS — N838 Other noninflammatory disorders of ovary, fallopian tube and broad ligament: Secondary | ICD-10-CM | POA: Diagnosis not present

## 2016-07-08 LAB — BASIC METABOLIC PANEL
Anion gap: 6 (ref 5–15)
BUN: 10 mg/dL (ref 6–20)
CALCIUM: 8.8 mg/dL — AB (ref 8.9–10.3)
CO2: 24 mmol/L (ref 22–32)
CREATININE: 0.74 mg/dL (ref 0.44–1.00)
Chloride: 110 mmol/L (ref 101–111)
GFR calc Af Amer: >60 mL/min (ref >60)
GLUCOSE: 117 mg/dL — AB (ref 65–99)
Potassium: 3.8 mmol/L (ref 3.5–5.1)
Sodium: 140 mmol/L (ref 135–145)

## 2016-07-08 LAB — CBC
HCT: 34.8 % — ABNORMAL LOW (ref 35.0–47.0)
Hemoglobin: 11.9 g/dL — ABNORMAL LOW (ref 12.0–16.0)
MCH: 28 pg (ref 26.0–34.0)
MCHC: 34.2 g/dL (ref 32.0–36.0)
MCV: 82 fL (ref 80.0–100.0)
PLATELETS: 226 10*3/uL (ref 150–440)
RBC: 4.25 MIL/uL (ref 3.80–5.20)
RDW: 14.1 % (ref 11.5–14.5)
WBC: 11.7 10*3/uL — ABNORMAL HIGH (ref 3.6–11.0)

## 2016-07-08 LAB — SURGICAL PATHOLOGY

## 2016-07-08 MED ORDER — IBUPROFEN 200 MG PO TABS: 600.0000 mg | ORAL_TABLET | Freq: Four times a day (QID) | ORAL | 0 refills | Status: DC | PRN

## 2016-07-08 MED ORDER — MEPERIDINE HCL 50 MG PO TABS: 100.0000 mg | ORAL_TABLET | ORAL | 0 refills | Status: DC | PRN

## 2016-07-08 MED ORDER — ONDANSETRON HCL 4 MG PO TABS: 4.0000 mg | ORAL_TABLET | Freq: Four times a day (QID) | ORAL | 0 refills | Status: DC | PRN

## 2016-07-08 MED ORDER — DOCUSATE SODIUM 100 MG PO CAPS: 100.0000 mg | ORAL_CAPSULE | Freq: Two times a day (BID) | ORAL | 0 refills | Status: DC

## 2016-07-08 NOTE — Discharge Summary (Signed)
Physician Discharge Summary  Patient ID: Christine Simmons MRN: [id number] DOB/AGE: [birth year] 40 y.o.  Admit date: 07/07/2016 Discharge date: 07/08/2016  Admission Diagnoses:  Discharge Diagnoses:  Active Problems:   Post-operative state   Discharged Condition: good  Hospital Course: uncomplicatyed LAVH and bilateral salpingectomy and endometriosis excision   Consults: None  Significant Diagnostic Studies: labs: post op hct 34.8, bun / cr 10/0.7 Treatments: surgery: as above  Discharge Exam: Blood pressure (!) 105/55, pulse 86, temperature 98.1 F (36.7 C), temperature source Oral, resp. rate 20, last menstrual period 06/18/2016, SpO2 99 %. General appearance: alert and cooperative Resp: clear to auscultation bilaterally Cardio: regular rate and rhythm, S1, S2 normal, no murmur, click, rub or gallop GI: soft, non-tender; bowel sounds normal; no masses,  no organomegaly Incision C/D/I Disposition: 01-Home or Self Care      Signed: Favio Moder 07/08/2016, 9:12 AM

## 2016-07-10 NOTE — Op Note (Signed)
NAME:  Christine Simmons, Christine Simmons                   ACCOUNT NO.:  MEDICAL RECORD NO.:  [id number]  LOCATION:                                 FACILITY:  PHYSICIAN:  Jennell Corner, MDDATE OF BIRTH:  April 01, 1976  DATE OF PROCEDURE: DATE OF DISCHARGE:                              OPERATIVE REPORT   PREOPERATIVE DIAGNOSIS: 1. Chronic pelvic pain. 2. Menorrhagia. 3. Dyspareunia. 4. Dysmenorrhea.  POSTOPERATIVE DIAGNOSIS: 1. Chronic pelvic pain. 2. Menorrhagia. 3. Dyspareunia. 4. Dysmenorrhea. 5. Endometriosis.  PROCEDURE PERFORMED: 1. Laparoscopic assisted vaginal hysterectomy. 2. Bilateral salpingectomy. 3. Peritoneal biopsy. 4. Excision of endometriosis.  SURGEON:  Jennell Corner, MD  ANESTHESIA:  General endotracheal anesthesia.  SURGEON:  Jennell Corner, MD  FIRST ASSISTANT:  Dalbert Garnet.  SECOND ASSISTANT:  PA student, Jussvp.  INDICATION:  A 40 year old, gravida 2, para 2 patient with a long history of chronic pelvic pain with right-sided pain worse than left. The patient also with a long history of menorrhagia with regular cycles.  FINDINGS: 1. Peritoneal scarring on the left ovarian fossa consistent with     endometriosis. 2. Several Allen-Master windows, the anterior cul-de-sac consistent     with endometriosis.  DESCRIPTION OF PROCEDURE:  After adequate general endotracheal anesthesia, patient was placed in dorsal supine position and patient's legs were placed in the [place] stirrups.  The patient's abdomen, perineum, and vagina were prepped and draped in normal sterile fashion. Time-out was performed.  Straight catheterization of the bladder yielded 150 mL clear urine.  Gloves were changed.  A 12 mm infraumbilical incision was made after injecting with 0.5% Marcaine.  The laparoscope was advanced into the abdominal cavity with the Optiview cannula.  The patient was placed in Trendelenburg and two 5 mm ports were placed in the left lower quadrant and the  right lower quadrant, these were placed 3 cm medial to the anterior iliac spine and under direct visualization, a 5 mm port were placed.  Initial evaluation of the pelvis revealed several filmy adhesions of the sigmoid colon to the left sidewall. There was peritoneal scarring and widening of the left ovarian fossa consistent with endometriosis and 2 Allen-Master windows in the anterior cul-de-sac.  Fallopian tubes and ovaries appeared normal, otherwise. The left fallopian tube was grasped with a fimbriated end and Harmonic Scalpel was used to dissect the fallopian tube free from the sidewall and from the ovarian pedicle.  Round ligament was then opened on the left and sequential bites were used to identify the left uterine artery. Anterior peritoneum was opened sharply with Harmonic Scalpel in conjunction with the Kleppingers, then used to cauterize the left uterine artery.  Similar procedure was repeated on the patient's right side after dissecting the right fallopian tube free.  The right broad ligament was opened after opening the right round ligament utero-ovarian ligament was opened.  The right uterine artery was skeletonized, cauterized, and then transected.  Both ureters were identified prior to the dissection.  Attention was directed to the patient's left ovarian fossa that was whitish area with scarring, this was grasped with graspers and Endo Shears were used to dissect this area free.  This was sent to Pathology for permanent identification.  There were 2 large Allen-Master windows identified anteriorly in the anterior cul-de-sac. Both of these pouches were inverted and dissected with excision of presumed endometriosis.  Good hemostasis noted.  Attention was then directed vaginally.  The patient's bladder was re-drained yielding additional 50 mL of urine.  A weighted speculum was placed in posterior vaginal vault and the anterior and posterior cervix were grasped with thyroid  tenacula.  Cervix was circumferentially injected with 1% lidocaine with 1:100,000 epinephrine.  A direct posterior colpotomy incision was used to gain entrance in the posterior cul-de-sac.  The uterosacral ligaments were bilaterally clamped, transected, and tagged for later identification.  The anterior cervix was circumferentially incised with the Bovie.  Anterior cul-de-sac was opened sharply without difficulty.  Deaver retractor was placed within to elevate the bladder anteriorly.  Cardinal ligaments were bilaterally clamped, transected, suture ligated with 0 Vicryl suture.  One additional clamp on both sides was used to free the uterus and the uterus with fallopian tubes were delivered through the vaginal cuff.  Good hemostasis was noted.  The peritoneum was then closed in a pursestring fashion with 2-0 PDS. Vaginal cuff was closed with a running 0 Vicryl suture.  The uterosacral ligaments were plicated centrally and the rest of vaginal cuff was closed and there was a small defect to the posterior introitus based on traction.  This area was closed with a 3-0 Vicryl suture.  Good hemostasis was noted.  Gloves were changed and attention was directed back to the abdominal cavity and the laparoscope was reinserted.  Good hemostasis was noted within.  Pressure was lower.  All areas were hemostatic.  Normal peristalsis was identified from the ureters bilaterally.  Pictures were taken.  Several filmy adhesions were taken down with left sigmoid colon adhesions taken down from the left sidewall and patient's abdomen was then deflated and the infraumbilical incision was closed with a 2-0 Vicryl fascial stitch and all skin incisions were closed with interrupted 4-0 Vicryl suture.  LiquiBand placed and Tegaderm dressings applied.  Foley was placed at the end of the case yielding clear urine.  There were no complications.  ESTIMATED BLOOD LOSS:  50 mL.  URINE OUTPUT:  200 mL.  INTRAOPERATIVE  FLUIDS:  1000 mL.  The patient did receive 2 g IV cefoxitin prior to commencement of the case.  There were no complications.  Patient tolerated the procedure well, was taken to recovery room in good condition.          ______________________________ Jennell Cornerhomas Michayla Mcneil, MD     TS/MEDQ  D:  07/07/2016  T:  07/08/2016  Job:  [id number]

## 2016-08-27 DIAGNOSIS — R102 Pelvic and perineal pain: Secondary | ICD-10-CM | POA: Diagnosis not present

## 2016-10-06 DIAGNOSIS — R232 Flushing: Secondary | ICD-10-CM | POA: Diagnosis not present

## 2016-10-06 DIAGNOSIS — N838 Other noninflammatory disorders of ovary, fallopian tube and broad ligament: Secondary | ICD-10-CM | POA: Diagnosis not present

## 2016-10-06 DIAGNOSIS — R300 Dysuria: Secondary | ICD-10-CM | POA: Diagnosis not present

## 2016-10-13 DIAGNOSIS — Z8742 Personal history of other diseases of the female genital tract: Secondary | ICD-10-CM | POA: Diagnosis not present

## 2016-11-24 DIAGNOSIS — N898 Other specified noninflammatory disorders of vagina: Secondary | ICD-10-CM | POA: Diagnosis not present

## 2016-11-24 DIAGNOSIS — N930 Postcoital and contact bleeding: Secondary | ICD-10-CM | POA: Diagnosis not present

## 2016-11-24 DIAGNOSIS — L928 Other granulomatous disorders of the skin and subcutaneous tissue: Secondary | ICD-10-CM | POA: Diagnosis not present

## 2016-12-09 DIAGNOSIS — N802 Endometriosis of fallopian tube: Secondary | ICD-10-CM | POA: Diagnosis not present

## 2016-12-09 DIAGNOSIS — N898 Other specified noninflammatory disorders of vagina: Secondary | ICD-10-CM | POA: Diagnosis not present

## 2016-12-12 ENCOUNTER — Encounter
Admission: RE | Disposition: A | Discharge status: Home / Self Care | Payer: Self-pay | Source: Ambulatory Visit | Attending: Gastroenterology

## 2016-12-12 ENCOUNTER — Ambulatory Visit: Payer: BLUE CROSS/BLUE SHIELD | Admitting: Anesthesiology

## 2016-12-12 ENCOUNTER — Encounter: Payer: Self-pay | Admitting: *Deleted

## 2016-12-12 ENCOUNTER — Ambulatory Visit
Admission: RE | Admit: 2016-12-12 | Discharge: 2016-12-12 | Disposition: A | Discharge status: Home / Self Care | Payer: BLUE CROSS/BLUE SHIELD | Source: Ambulatory Visit | Attending: Gastroenterology | Admitting: Gastroenterology

## 2016-12-12 DIAGNOSIS — K228 Other specified diseases of esophagus: Secondary | ICD-10-CM | POA: Diagnosis not present

## 2016-12-12 DIAGNOSIS — K2950 Unspecified chronic gastritis without bleeding: Secondary | ICD-10-CM | POA: Insufficient documentation

## 2016-12-12 DIAGNOSIS — K2960 Other gastritis without bleeding: Secondary | ICD-10-CM | POA: Insufficient documentation

## 2016-12-12 DIAGNOSIS — K210 Gastro-esophageal reflux disease with esophagitis: Secondary | ICD-10-CM | POA: Insufficient documentation

## 2016-12-12 DIAGNOSIS — Z87891 Personal history of nicotine dependence: Secondary | ICD-10-CM | POA: Insufficient documentation

## 2016-12-12 DIAGNOSIS — K2900 Acute gastritis without bleeding: Secondary | ICD-10-CM | POA: Diagnosis not present

## 2016-12-12 DIAGNOSIS — R1013 Epigastric pain: Secondary | ICD-10-CM | POA: Insufficient documentation

## 2016-12-12 HISTORY — DX: Lichen simplex chronicus: L28.0

## 2016-12-12 HISTORY — DX: Excessive and frequent menstruation with regular cycle: N92.0

## 2016-12-12 HISTORY — PX: ESOPHAGOGASTRODUODENOSCOPY (EGD) WITH PROPOFOL: SHX5813

## 2016-12-12 HISTORY — DX: Unspecified ovarian cyst, unspecified side: N83.209

## 2016-12-12 HISTORY — DX: Other cervical disc degeneration, unspecified cervical region: M50.30

## 2016-12-12 SURGERY — ESOPHAGOGASTRODUODENOSCOPY (EGD) WITH PROPOFOL
Anesthesia: General

## 2016-12-12 MED ORDER — GLYCOPYRROLATE 0.2 MG/ML IJ SOLN
INTRAMUSCULAR | Status: DC | PRN
  Administered 2016-12-12: 0.2 mg via INTRAVENOUS

## 2016-12-12 MED ORDER — PROPOFOL 500 MG/50ML IV EMUL
INTRAVENOUS | Status: AC
  Filled 2016-12-12: qty 50

## 2016-12-12 MED ORDER — PROPOFOL 10 MG/ML IV BOLUS
INTRAVENOUS | Status: DC | PRN
  Administered 2016-12-12: 50 mg via INTRAVENOUS
  Administered 2016-12-12: 30 mg via INTRAVENOUS
  Administered 2016-12-12: 20 mg via INTRAVENOUS
  Administered 2016-12-12: 50 mg via INTRAVENOUS

## 2016-12-12 MED ORDER — PROPOFOL 500 MG/50ML IV EMUL
INTRAVENOUS | Status: DC | PRN
  Administered 2016-12-12: 125 ug/kg/min via INTRAVENOUS

## 2016-12-12 MED ORDER — SODIUM CHLORIDE 0.9 % IV SOLN: INTRAVENOUS | Status: DC

## 2016-12-12 MED ORDER — GLYCOPYRROLATE 0.2 MG/ML IJ SOLN
INTRAMUSCULAR | Status: AC
  Filled 2016-12-12: qty 1

## 2016-12-12 MED ORDER — SODIUM CHLORIDE 0.9 % IV SOLN
INTRAVENOUS | Status: DC
  Administered 2016-12-12: 14:00:00 via INTRAVENOUS

## 2016-12-12 MED ORDER — PROPOFOL 10 MG/ML IV BOLUS
INTRAVENOUS | Status: AC
  Filled 2016-12-12: qty 20

## 2016-12-12 MED ORDER — SODIUM CHLORIDE 0.9 % IV SOLN
INTRAVENOUS | Status: DC | PRN
  Administered 2016-12-12: 15:00:00 via INTRAVENOUS

## 2016-12-12 NOTE — H&P (Signed)
Outpatient short stay form Pre-procedure 12/12/2016 3:24 PM Christena DeemMartin U Chavez Rosol MD  Primary Physician: No primary care provider  Reason for visit:  EGD  History of present illness:  Patient is a 41 year old female presenting today as above. She has been having problems of epigastric pain sometimes it radiates toward the left upper quadrant or toward the back. She does have a history of a cholecystectomy about 2 years ago. She did have a CT scan done on 12/27/2015 that showed a abnormal wall thickening in the pylorus or proximal duodenum. Suspect gastritis and  proximal duodenitis. She has had a H. pylori stool antigen that was negative.  Patient also relates having quite variable bowel habits with lower abdominal pain as well. There is some bloating. At this sounds much like irritable bowel syndrome.    Current Facility-Administered Medications:  .  0.9 %  sodium chloride infusion, , Intravenous, Continuous, Christena DeemMartin U Mimie Goering, MD .  0.9 %  sodium chloride infusion, , Intravenous, Continuous, Christena DeemMartin U Jocelynn Gioffre, MD, Last Rate: 20 mL/hr at 12/12/16 1332 .  0.9 %  sodium chloride infusion, , Intravenous, Continuous, Christena DeemMartin U Aylssa Herrig, MD  Facility-Administered Medications Ordered in Other Encounters:  .  0.9 %  sodium chloride infusion, , Intravenous, Continuous PRN, Marlana SalvageSandra Jessup, CRNA .  glycopyrrolate (ROBINUL) injection, , Intravenous, Anesthesia Intra-op, Marlana SalvageSandra Jessup, CRNA, 0.2 mg at 12/12/16 1511  Prescriptions Prior to Admission  Medication Sig Dispense Refill Last Dose  . ibuprofen (ADVIL) 200 MG tablet Take 3 tablets (600 mg total) by mouth every 6 (six) hours as needed for mild pain. 30 tablet 0 Past Month at Unknown time  . leuprolide (LUPRON) 11.25 MG injection Inject 11.25 mg into the muscle every 3 (three) months.   Past Month at Unknown time  . norethindrone (AYGESTIN) 5 MG tablet Take 5 mg by mouth daily.   12/11/2016 at Unknown time  . pantoprazole (PROTONIX) 40 MG tablet Take  40 mg by mouth daily.   12/11/2016 at Unknown time  . dicyclomine (BENTYL) 20 MG tablet Take 1 tablet (20 mg total) by mouth 3 (three) times daily as needed for spasms. (Patient not taking: Reported on 12/12/2016) 30 tablet 0 Not Taking at Unknown time  . docusate sodium (COLACE) 100 MG capsule Take 1 capsule (100 mg total) by mouth 2 (two) times daily. (Patient not taking: Reported on 12/12/2016) 30 capsule 0 Not Taking at Unknown time  . meperidine (DEMEROL) 50 MG tablet Take 2 tablets (100 mg total) by mouth every 4 (four) hours as needed for moderate pain. (Patient not taking: Reported on 09/29/2016) 30 tablet 0 Completed Course at Unknown time  . ondansetron (ZOFRAN) 4 MG tablet Take 1 tablet (4 mg total) by mouth every 6 (six) hours as needed for nausea. (Patient not taking: Reported on 12/12/2016) 20 tablet 0 Not Taking at Unknown time     Allergies  Allergen Reactions  . Sulfa Antibiotics Nausea And Vomiting, Nausea Only and Rash    Other reaction(s): Vomiting Hallucinations     Past Medical History:  Diagnosis Date  . Arthritis    low back and neck  . Back pain of lumbar region with sciatica   . Chronic kidney disease    kidney stones and infection  . Degenerative disc disease, cervical   . Family history of adverse reaction to anesthesia    "unable to put mother to sleep"  . GERD (gastroesophageal reflux disease)   . Lichen   . Menorrhagia   . Ovarian  cyst     Review of systems:      Physical Exam    Heart and lungs: Regular rate and rhythm without rub or gallop, lungs are bilaterally clear.    HEENT: Normocephalic atraumatic eyes are anicteric    Other:     Pertinant exam for procedure: Soft nontender nondistended bowel sounds positive normoactive    Planned proceedures: EGD and indicated procedures. I have discussed the risks benefits and complications of procedures to include not limited to bleeding, infection, perforation and the risk of sedation and the  patient wishes to proceed.    Christena Deem, MD Gastroenterology 12/12/2016  3:24 PM

## 2016-12-12 NOTE — Op Note (Signed)
[facility] Gastroenterology Patient Name: Christine Simmons Procedure Date: 12/12/2016 3:11 PM MRN: [id number] Account #: [id number] Date of Birth: 1976-05-21 Admit Type: Outpatient Age: [age] Room: [facility] ENDO ROOM 3 Gender: Female Note Status: Finalized Procedure:            Upper GI endoscopy Indications:          Epigastric abdominal pain, Abnormal CT of the GI tract Providers:            Christena DeemMartin U. Skulskie, MD Referring MD:         No Local Md, MD (Referring MD) Medicines:            Monitored Anesthesia Care Complications:        No immediate complications. Procedure:            Pre-Anesthesia Assessment:                       - ASA Grade Assessment: II - A patient with mild                        systemic disease.                       After obtaining informed consent, the endoscope was                        passed under direct vision. Throughout the procedure,                        the patient's blood pressure, pulse, and oxygen                        saturations were monitored continuously. The Endoscope                        was introduced through the mouth, and advanced to the                        third part of duodenum. The upper GI endoscopy was                        accomplished without difficulty. The patient tolerated                        the procedure well. Findings:      The Z-line was variable. Biopsies were taken with a cold forceps for       histology.      The exam of the esophagus was otherwise normal.      Diffuse mild inflammation characterized by congestion (edema), erythema       and granularity was found in the gastric body. Biopsies were taken with       a cold forceps for histology. Biopsies were taken with a cold forceps       for Helicobacter pylori testing.      Patchy mild inflammation characterized by erosions and erythema was       found in the gastric antrum. Biopsies were taken with a cold forceps for       histology.  Biopsies were taken with a cold forceps for Helicobacter       pylori testing.      The cardia  and gastric fundus were normal on retroflexion.      The examined duodenum was normal. Impression:           - Z-line variable. Biopsied.                       - Gastritis. Biopsied.                       - Erosive gastritis. Biopsied.                       - Normal examined duodenum. Recommendation:       - Discharge patient to home.                       - Use Protonix (pantoprazole) 40 mg PO daily daily. Procedure Code(s):    --- Professional ---                       [phone], Esophagogastroduodenoscopy, flexible, transoral;                        with biopsy, single or multiple Diagnosis Code(s):    --- Professional ---                       K22.8, Other specified diseases of esophagus                       K29.70, Gastritis, unspecified, without bleeding                       K29.60, Other gastritis without bleeding                       R10.13, Epigastric pain                       R93.3, Abnormal findings on diagnostic imaging of other                        parts of digestive tract CPT copyright 2016 [employer]. All rights reserved. The codes documented in this report are preliminary and upon coder review may  be revised to meet current compliance requirements. Christena Deem, MD 12/12/2016 3:47:54 PM This report has been signed electronically. Number of Addenda: 0 Note Initiated On: 12/12/2016 3:11 PM      [facility]

## 2016-12-12 NOTE — Transfer of Care (Signed)
Immediate Anesthesia Transfer of Care Note  Patient: Christine Simmons  Procedure(s) Performed: Procedure(s): ESOPHAGOGASTRODUODENOSCOPY (EGD) WITH PROPOFOL (N/A)  Patient Location: PACU  Anesthesia Type:General  Level of Consciousness: responds to stimulation  Airway & Oxygen Therapy: Patient Spontanous Breathing and Patient connected to nasal cannula oxygen  Post-op Assessment: Report given to RN and Post -op Vital signs reviewed and stable  Post vital signs: Reviewed and stable  Last Vitals:  Vitals:   12/12/16 1315 12/12/16 1548  BP: (!) 111/49 113/65  Pulse: 70 83  Resp: 20 15  Temp: 37.1 C 36.2 C    Last Pain:  Vitals:   12/12/16 1548  TempSrc: Tympanic         Complications: No apparent anesthesia complications

## 2016-12-12 NOTE — Anesthesia Post-op Follow-up Note (Cosign Needed)
Anesthesia QCDR form completed.        

## 2016-12-12 NOTE — Anesthesia Postprocedure Evaluation (Addendum)
Anesthesia Post Note  Patient: Christine Simmons  Procedure(s) Performed: Procedure(s) (LRB): ESOPHAGOGASTRODUODENOSCOPY (EGD) WITH PROPOFOL (N/A)  Patient location during evaluation: Endoscopy Anesthesia Type: General Level of consciousness: awake and alert Pain management: pain level controlled Vital Signs Assessment: post-procedure vital signs reviewed and stable Respiratory status: spontaneous breathing and respiratory function stable Cardiovascular status: stable Anesthetic complications: no     Last Vitals:  Vitals:   12/12/16 1315 12/12/16 1548  BP: (!) 111/49 113/65  Pulse: 70 83  Resp: 20 15  Temp: 37.1 C 36.2 C    Last Pain:  Vitals:   12/12/16 1548  TempSrc: Tympanic                 KEPHART,WILLIAM K

## 2016-12-12 NOTE — Anesthesia Preprocedure Evaluation (Signed)
Anesthesia Evaluation  Patient identified by MRN, date of birth, ID band Patient awake    Reviewed: Allergy & Precautions, NPO status , Patient's Chart, lab work & pertinent test results  History of Anesthesia Complications (+) Family history of anesthesia reactionNegative for: history of anesthetic complications (problems with mom getting to sleep)  Airway Mallampati: II  TM Distance: >3 FB     Dental  (+) Chipped   Pulmonary neg pulmonary ROS, former smoker,    Pulmonary exam normal        Cardiovascular negative cardio ROS Normal cardiovascular exam     Neuro/Psych negative neurological ROS  negative psych ROS   GI/Hepatic Neg liver ROS, GERD  Medicated,  Endo/Other  negative endocrine ROS  Renal/GU Renal disease: stones.stones     Musculoskeletal  (+) Arthritis , Osteoarthritis,    Abdominal   Peds  Hematology negative hematology ROS (+)   Anesthesia Other Findings   Reproductive/Obstetrics                             Anesthesia Physical  Anesthesia Plan  ASA: II  Anesthesia Plan: General   Post-op Pain Management:    Induction: Intravenous  Airway Management Planned: Oral ETT  Additional Equipment:   Intra-op Plan:   Post-operative Plan:   Informed Consent: I have reviewed the patients History and Physical, chart, labs and discussed the procedure including the risks, benefits and alternatives for the proposed anesthesia with the patient or authorized representative who has indicated his/her understanding and acceptance.     Plan Discussed with:   Anesthesia Plan Comments:         Anesthesia Quick Evaluation                                   Anesthesia Evaluation  Patient identified by MRN, date of birth, ID band Patient awake    Reviewed: Allergy & Precautions, NPO status , Patient's Chart, lab work & pertinent test results  History of Anesthesia  Complications (+) Family history of anesthesia reactionNegative for: history of anesthetic complications (problems with mom getting to sleep)  Airway Mallampati: II       Dental   Pulmonary neg pulmonary ROS, former smoker,           Cardiovascular negative cardio ROS       Neuro/Psych negative neurological ROS     GI/Hepatic Neg liver ROS, GERD  Medicated and Controlled,  Endo/Other  negative endocrine ROS  Renal/GU Renal disease (stones)     Musculoskeletal  (+) Arthritis , Osteoarthritis,    Abdominal   Peds  Hematology negative hematology ROS (+)   Anesthesia Other Findings   Reproductive/Obstetrics                             Anesthesia Physical Anesthesia Plan  ASA: II  Anesthesia Plan: General   Post-op Pain Management:    Induction: Intravenous  Airway Management Planned: Oral ETT  Additional Equipment:   Intra-op Plan:   Post-operative Plan:   Informed Consent: I have reviewed the patients History and Physical, chart, labs and discussed the procedure including the risks, benefits and alternatives for the proposed anesthesia with the patient or authorized representative who has indicated his/her understanding and acceptance.     Plan Discussed with:   Anesthesia Plan Comments:  Anesthesia Quick Evaluation  

## 2016-12-15 ENCOUNTER — Encounter: Payer: Self-pay | Admitting: Gastroenterology

## 2016-12-16 LAB — SURGICAL PATHOLOGY

## 2017-01-15 DIAGNOSIS — J019 Acute sinusitis, unspecified: Secondary | ICD-10-CM | POA: Diagnosis not present

## 2017-01-15 DIAGNOSIS — K219 Gastro-esophageal reflux disease without esophagitis: Secondary | ICD-10-CM | POA: Diagnosis not present

## 2017-01-15 DIAGNOSIS — R1013 Epigastric pain: Secondary | ICD-10-CM | POA: Diagnosis not present

## 2017-01-15 DIAGNOSIS — K5909 Other constipation: Secondary | ICD-10-CM | POA: Diagnosis not present

## 2017-02-20 DIAGNOSIS — N802 Endometriosis of fallopian tube: Secondary | ICD-10-CM | POA: Diagnosis not present

## 2017-03-02 DIAGNOSIS — R509 Fever, unspecified: Secondary | ICD-10-CM | POA: Diagnosis not present

## 2017-03-02 DIAGNOSIS — R5383 Other fatigue: Secondary | ICD-10-CM | POA: Diagnosis not present

## 2017-03-02 DIAGNOSIS — M549 Dorsalgia, unspecified: Secondary | ICD-10-CM | POA: Diagnosis not present

## 2017-03-02 DIAGNOSIS — Z882 Allergy status to sulfonamides status: Secondary | ICD-10-CM | POA: Diagnosis not present

## 2017-03-02 DIAGNOSIS — R05 Cough: Secondary | ICD-10-CM | POA: Diagnosis not present

## 2017-03-02 DIAGNOSIS — R0789 Other chest pain: Secondary | ICD-10-CM | POA: Diagnosis not present

## 2017-03-02 DIAGNOSIS — G8929 Other chronic pain: Secondary | ICD-10-CM | POA: Diagnosis not present

## 2017-03-02 DIAGNOSIS — J069 Acute upper respiratory infection, unspecified: Secondary | ICD-10-CM | POA: Diagnosis not present

## 2017-03-03 DIAGNOSIS — J1189 Influenza due to unidentified influenza virus with other manifestations: Secondary | ICD-10-CM | POA: Diagnosis not present

## 2017-03-12 DIAGNOSIS — N802 Endometriosis of fallopian tube: Secondary | ICD-10-CM | POA: Diagnosis not present

## 2017-03-25 ENCOUNTER — Emergency Department
Admission: EM | Admit: 2017-03-25 | Discharge: 2017-03-25 | Disposition: A | Discharge status: Home / Self Care | Payer: BLUE CROSS/BLUE SHIELD | Attending: Emergency Medicine | Admitting: Emergency Medicine

## 2017-03-25 ENCOUNTER — Encounter: Payer: Self-pay | Admitting: Emergency Medicine

## 2017-03-25 ENCOUNTER — Emergency Department: Payer: BLUE CROSS/BLUE SHIELD

## 2017-03-25 DIAGNOSIS — Y929 Unspecified place or not applicable: Secondary | ICD-10-CM | POA: Diagnosis not present

## 2017-03-25 DIAGNOSIS — W010XXA Fall on same level from slipping, tripping and stumbling without subsequent striking against object, initial encounter: Secondary | ICD-10-CM | POA: Diagnosis not present

## 2017-03-25 DIAGNOSIS — Z87891 Personal history of nicotine dependence: Secondary | ICD-10-CM | POA: Diagnosis not present

## 2017-03-25 DIAGNOSIS — Y939 Activity, unspecified: Secondary | ICD-10-CM | POA: Diagnosis not present

## 2017-03-25 DIAGNOSIS — N189 Chronic kidney disease, unspecified: Secondary | ICD-10-CM | POA: Diagnosis not present

## 2017-03-25 DIAGNOSIS — Y999 Unspecified external cause status: Secondary | ICD-10-CM | POA: Insufficient documentation

## 2017-03-25 DIAGNOSIS — S3422XA Injury of nerve root of sacral spine, initial encounter: Secondary | ICD-10-CM | POA: Diagnosis not present

## 2017-03-25 DIAGNOSIS — S300XXA Contusion of lower back and pelvis, initial encounter: Secondary | ICD-10-CM | POA: Diagnosis not present

## 2017-03-25 DIAGNOSIS — S3992XA Unspecified injury of lower back, initial encounter: Secondary | ICD-10-CM | POA: Diagnosis present

## 2017-03-25 DIAGNOSIS — M533 Sacrococcygeal disorders, not elsewhere classified: Secondary | ICD-10-CM | POA: Diagnosis not present

## 2017-03-25 LAB — POCT PREGNANCY, URINE: PREG TEST UR: NEGATIVE

## 2017-03-25 IMAGING — CR DG SACRUM/COCCYX 2+V
1 series · 3 of 3 positions shown · non-contrast
Comparison: CT of the abdomen pelvis dated [DATE]

CLINICAL DATA: 40-year-old female with fall and sacral pain.

EXAM:
SACRUM AND COCCYX - 2+ VIEW

[Series 1: dg sacrum/coccyx · 0.14mm/px · 3 of 3 slices shown]
[im 1/3]
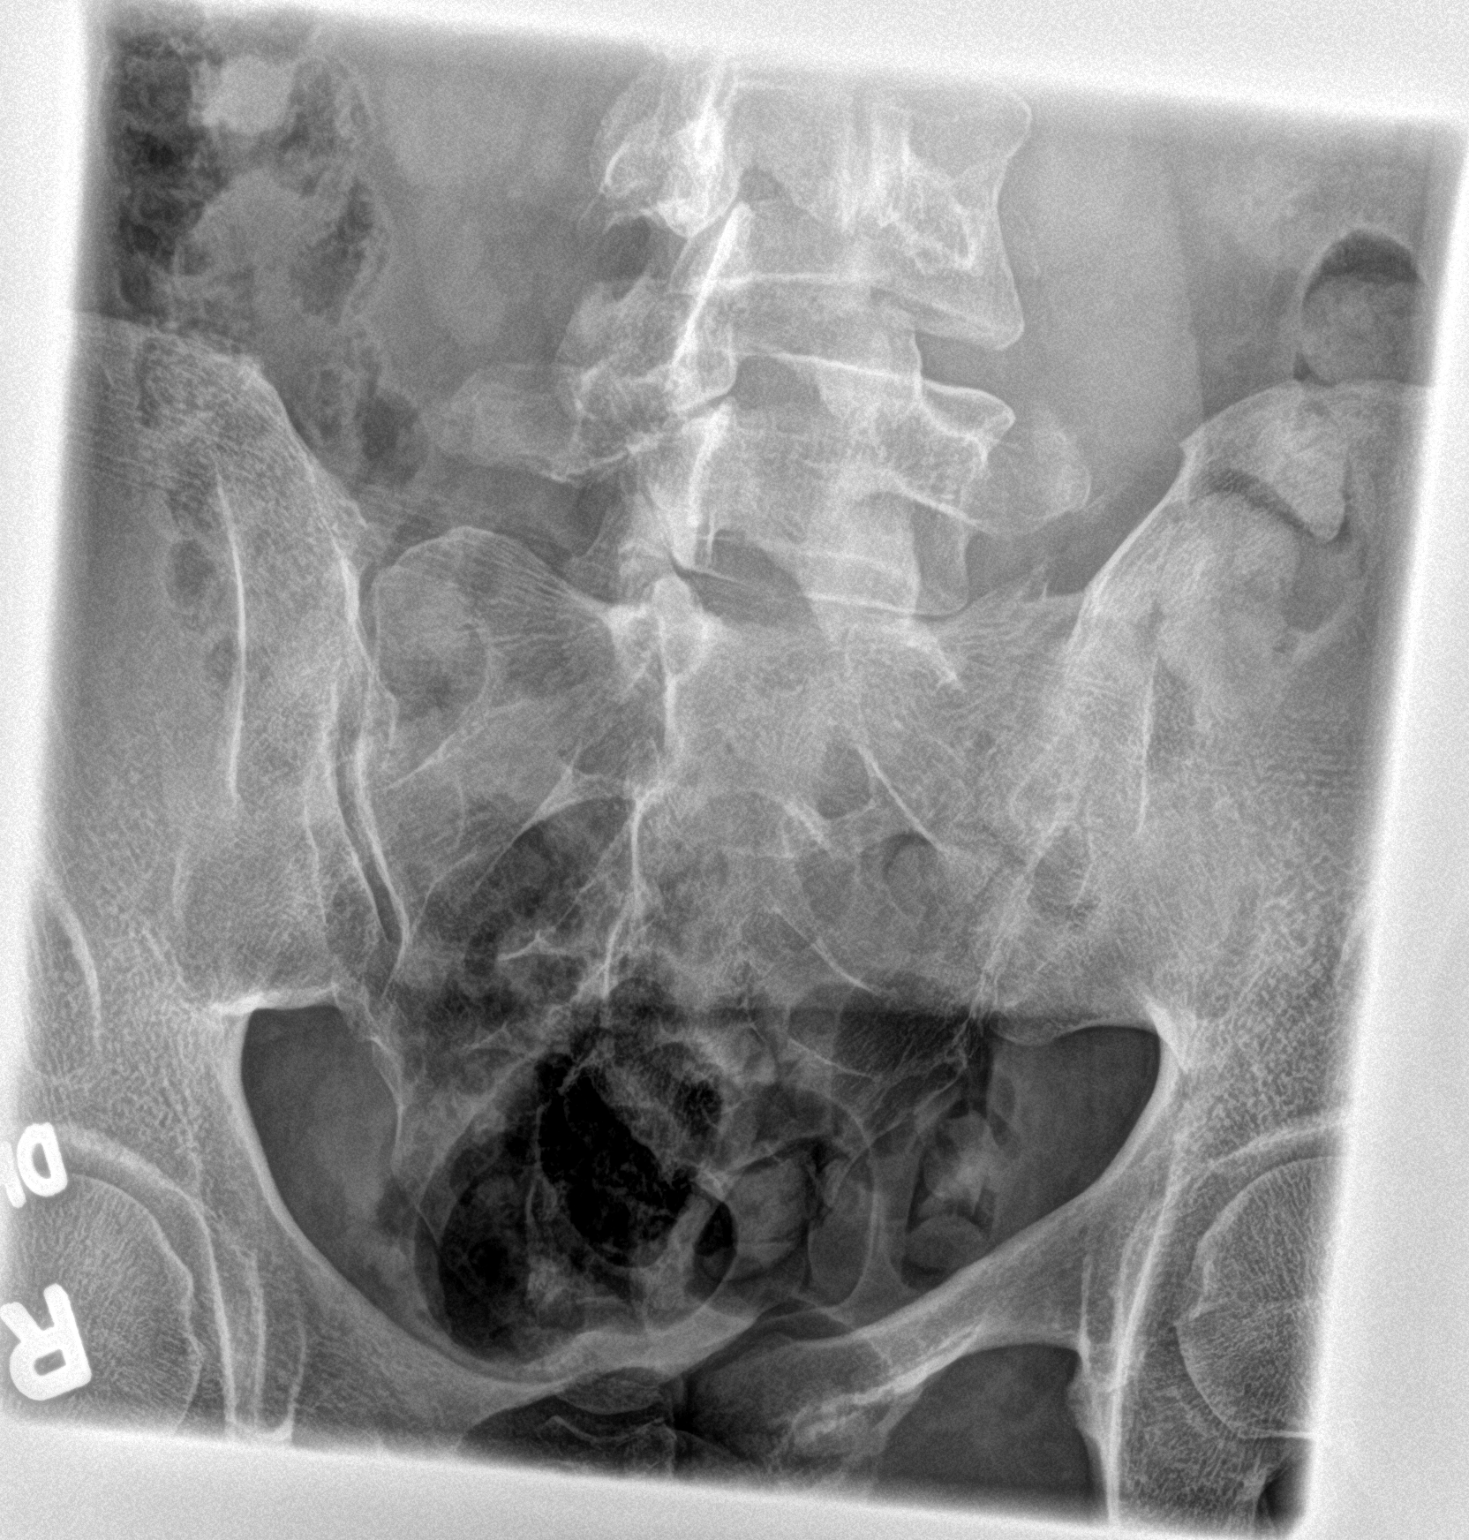
[im 2/3]
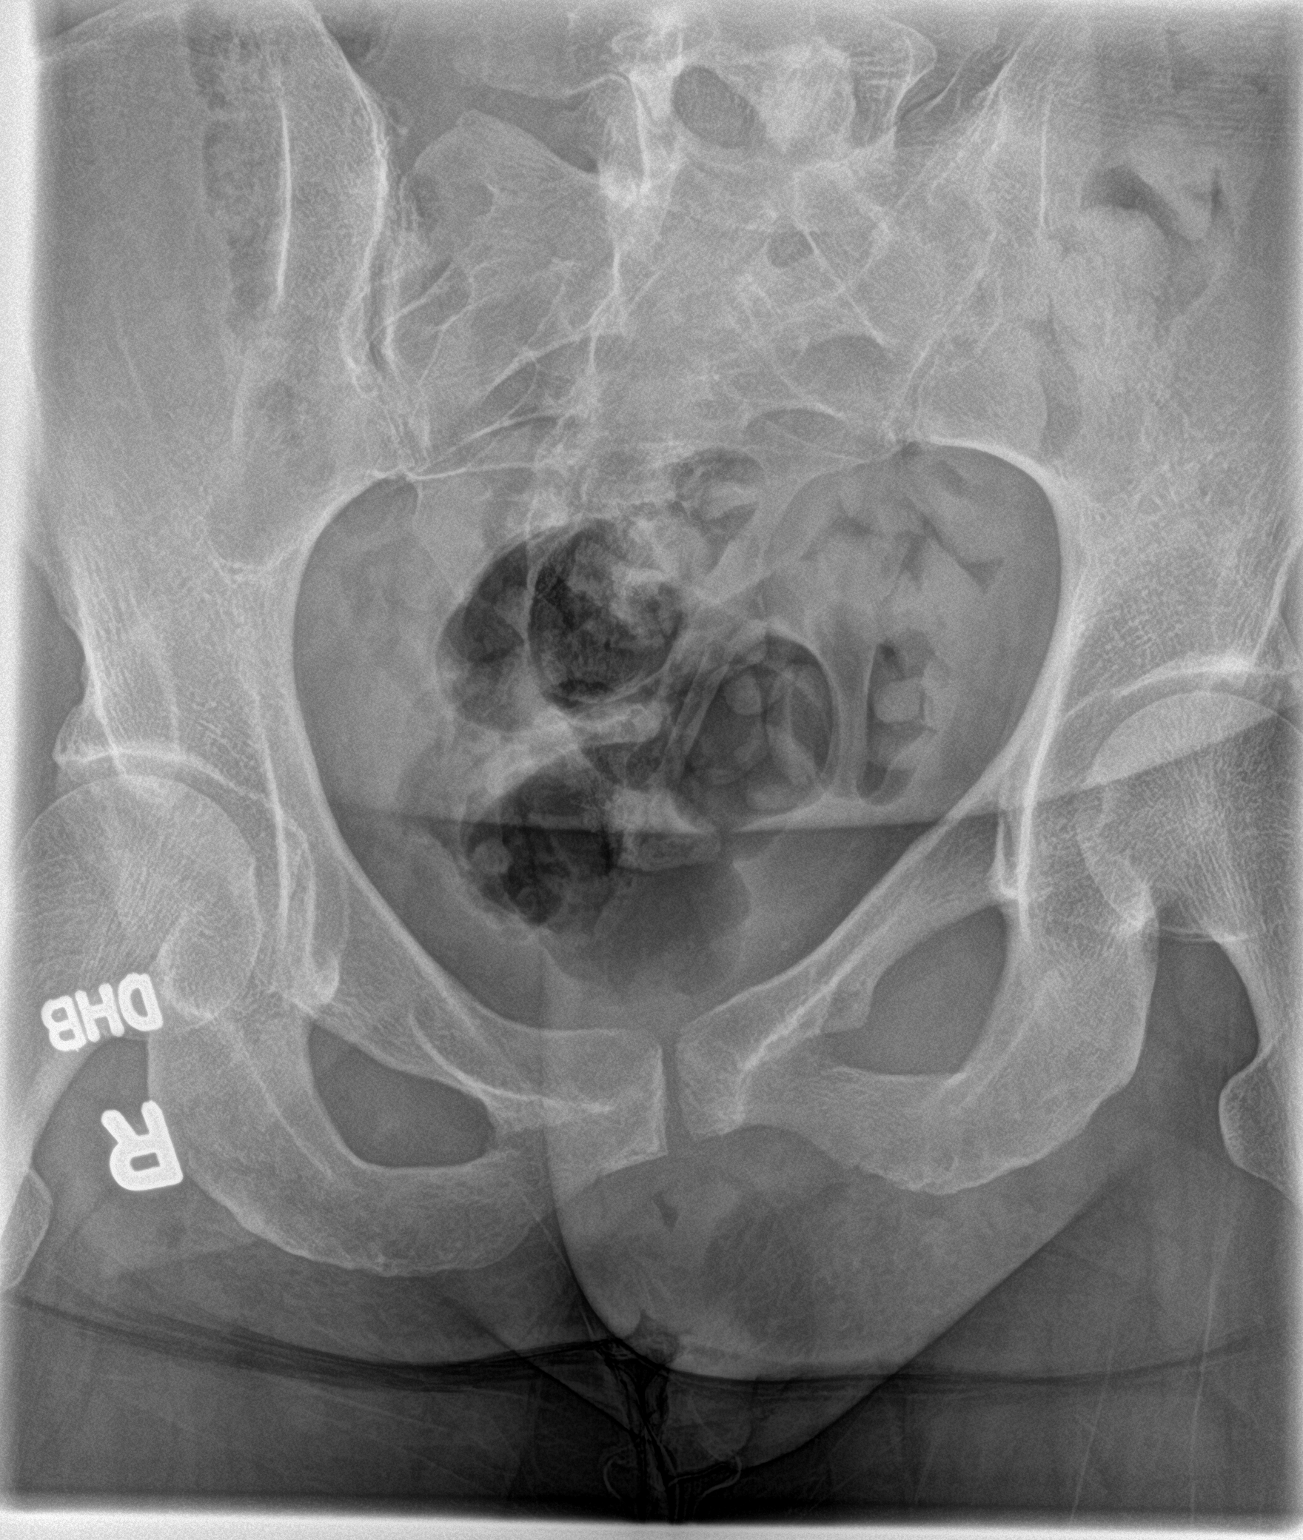
[im 3/3]
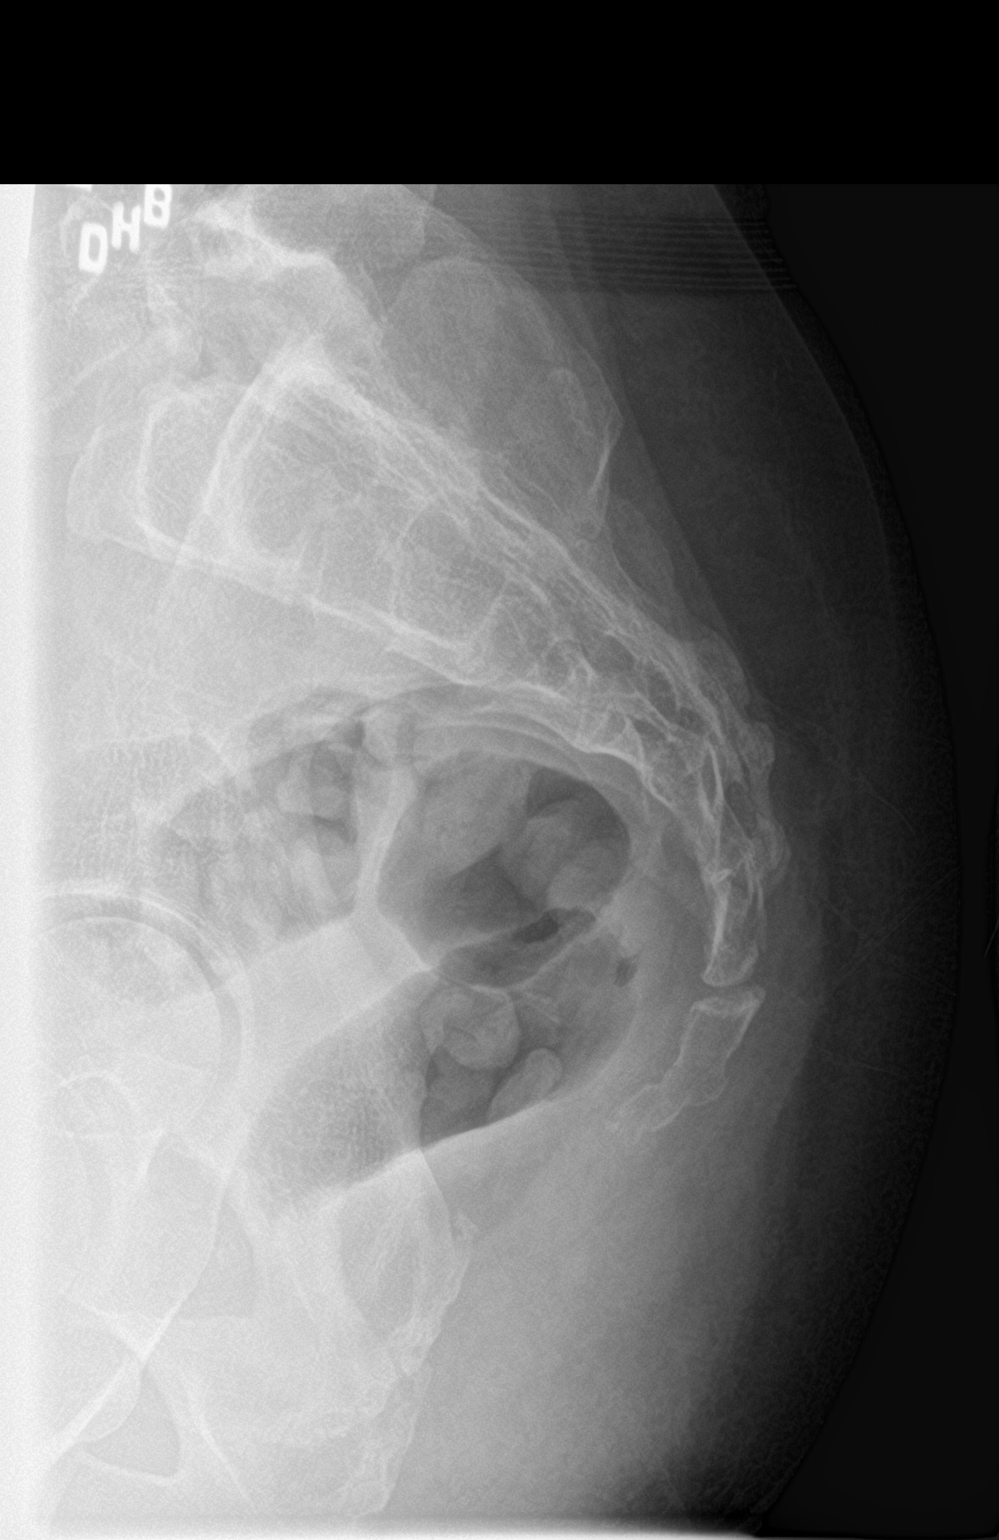

[3 of 3 positions shown; findings below may reference images not displayed]

FINDINGS: There is no evidence of fracture or other focal bone lesions.
IMPRESSION: Negative.

## 2017-03-25 MED ORDER — NAPROXEN 500 MG PO TABS: 500.0000 mg | ORAL_TABLET | Freq: Two times a day (BID) | ORAL | Status: DC

## 2017-03-25 MED ORDER — METHOCARBAMOL 750 MG PO TABS: 750.0000 mg | ORAL_TABLET | Freq: Four times a day (QID) | ORAL | 0 refills | Status: DC

## 2017-03-25 MED ORDER — TRAMADOL HCL 50 MG PO TABS: 50.0000 mg | ORAL_TABLET | Freq: Four times a day (QID) | ORAL | 0 refills | Status: DC | PRN

## 2017-03-25 NOTE — ED Provider Notes (Signed)
[facility] Emergency Department Provider Note   ____________________________________________   First MD Initiated Contact with Patient 03/25/17 1918     (approximate)  I have reviewed the triage vital signs and the nursing notes.   HISTORY  Chief Complaint Fall    HPI Christine Simmons is a 41 y.o. female patient complain of coccyx pain secondary to a slip and fall 3 days ago. Patient states she slipped on her porch door in the rain and landed on her coccyx. Patient state increasing pain to the coccyx area with movement. Patient denies any bladder or bowel dysfunction. Patient also complaining of right arm shoulder pain secondary to fall. Patient stated this intermittently numbness to the right upper extremity. No palliative measures for complaint. Patient rates the pain as a 5/10.   Past Medical History:  Diagnosis Date  . Arthritis    low back and neck  . Back pain of lumbar region with sciatica   . Chronic kidney disease    kidney stones and infection  . Degenerative disc disease, cervical   . Family history of adverse reaction to anesthesia    "unable to put mother to sleep"  . GERD (gastroesophageal reflux disease)   . Lichen   . Menorrhagia   . Ovarian cyst     Patient Active Problem List   Diagnosis Date Noted  . Post-operative state 07/07/2016  . Cholelithiasis 04/10/2015    Past Surgical History:  Procedure Laterality Date  . ANKLE SURGERY Left   . CESAREAN SECTION    . CHOLECYSTECTOMY N/A 04/10/2015   Procedure: LAPAROSCOPIC CHOLECYSTECTOMY WITH INTRAOPERATIVE CHOLANGIOGRAM;  Surgeon: Lattie Haw, MD;  Location: [facility] ORS;  Service: General;  Laterality: N/A;  . ESOPHAGOGASTRODUODENOSCOPY (EGD) WITH PROPOFOL N/A 12/12/2016   Procedure: ESOPHAGOGASTRODUODENOSCOPY (EGD) WITH PROPOFOL;  Surgeon: Christena Deem, MD;  Location: [facility] ENDOSCOPY;  Service: Endoscopy;  Laterality: N/A;  . LAPAROSCOPIC VAGINAL HYSTERECTOMY WITH  SALPINGECTOMY Bilateral 07/07/2016   Procedure: LAPAROSCOPIC ASSISTED VAGINAL HYSTERECTOMY WITH SALPINGECTOMY;  Surgeon: Suzy Bouchard, MD;  Location: [facility] ORS;  Service: Gynecology;  Laterality: Bilateral;    Prior to Admission medications   Medication Sig Start Date End Date Taking? Authorizing Provider  dicyclomine (BENTYL) 20 MG tablet Take 1 tablet (20 mg total) by mouth 3 (three) times daily as needed for spasms. Patient not taking: Reported on 12/12/2016 12/27/15 12/26/16  Schaevitz, Myra Rude, MD  docusate sodium (COLACE) 100 MG capsule Take 1 capsule (100 mg total) by mouth 2 (two) times daily. Patient not taking: Reported on 12/12/2016 07/08/16   Schermerhorn, Ihor Austin, MD  ibuprofen (ADVIL) 200 MG tablet Take 3 tablets (600 mg total) by mouth every 6 (six) hours as needed for mild pain. 07/08/16   Schermerhorn, Ihor Austin, MD  leuprolide (LUPRON) 11.25 MG injection Inject 11.25 mg into the muscle every 3 (three) months.    [provider]  meperidine (DEMEROL) 50 MG tablet Take 2 tablets (100 mg total) by mouth every 4 (four) hours as needed for moderate pain. Patient not taking: Reported on 09/29/2016 07/08/16   Schermerhorn, Ihor Austin, MD  methocarbamol (ROBAXIN-750) 750 MG tablet Take 1 tablet (750 mg total) by mouth 4 (four) times daily. 03/25/17   Joni Reining, PA-C  naproxen (NAPROSYN) 500 MG tablet Take 1 tablet (500 mg total) by mouth 2 (two) times daily with a meal. 03/25/17   Joni Reining, PA-C  norethindrone (AYGESTIN) 5 MG tablet Take 5 mg by mouth daily.  [provider]  ondansetron (ZOFRAN) 4 MG tablet Take 1 tablet (4 mg total) by mouth every 6 (six) hours as needed for nausea. Patient not taking: Reported on 12/12/2016 07/08/16   Schermerhorn, Ihor Austin, MD  pantoprazole (PROTONIX) 40 MG tablet Take 40 mg by mouth daily.    [provider]  traMADol (ULTRAM) 50 MG tablet Take 1 tablet (50 mg total) by mouth every 6 (six) hours as needed.  03/25/17 03/25/18  Joni Reining, PA-C    Allergies Sulfa antibiotics  Family History  Problem Relation Age of Onset  . Hypertension Mother   . Gallbladder disease Maternal Grandmother     Social History Social History  Substance Use Topics  . Smoking status: Former Smoker    Quit date: 04/09/1997  . Smokeless tobacco: Never Used  . Alcohol use No    Review of Systems Constitutional: No fever/chills Eyes: No visual changes. ENT: No sore throat. Cardiovascular: Denies chest pain. Respiratory: Denies shortness of breath. Gastrointestinal: No abdominal pain.  No nausea, no vomiting.  No diarrhea.  No constipation. Genitourinary: Negative for dysuria. Musculoskeletal: right shoulder and low back pain Skin: Negative for rash. Neurological: Negative for headaches, focal weakness or numbness. Allergic/Immunilogical:sulfa antibiotics  ____________________________________________   PHYSICAL EXAM:  VITAL SIGNS: ED Triage Vitals  Enc Vitals Group     BP 03/25/17 1829 113/73     Pulse Rate 03/25/17 1829 80     Resp 03/25/17 1829 16     Temp 03/25/17 1829 98.3 F (36.8 C)     Temp Source 03/25/17 1829 Oral     SpO2 03/25/17 1829 100 %     Weight 03/25/17 1829 150 lb (68 kg)     Height 03/25/17 1829 5\' 2"  (1.575 m)     Head Circumference --      Peak Flow --      Pain Score 03/25/17 1830 5     Pain Loc --      Pain Edu? --      Excl. in GC? --     Constitutional: Alert and oriented. Well appearing and in no acute distress. Head: Atraumatic. NNeck: No stridor.  No cervical spine tenderness to palpation. Hematological/Lymphatic/Immunilogical: No cervical lymphadenopathy. Cardiovascular: Normal rate, regular rhythm. Grossly normal heart sounds.  Good peripheral circulation. Respiratory: Normal respiratory effort.  No retractions. Lungs CTAB. Musculoskeletal: no obvious deformity to the right upper extremity. Patient has full nuchal range of motion to complain of pain.  Examination of back reveals no spinal deformity. Patient has moderate guarding palpation of L4-S1. Patient is negative straight leg test. Patient sits and stands with heavy glass on upper extremity. Patient has atypical gait. Neurologic:  Normal speech and language. No gross focal neurologic deficits are appreciated. No gait instability. Skin:  Skin is warm, dry and intact. No rash noted. Psychiatric: Mood and affect are normal. Speech and behavior are normal.  ____________________________________________   LABS (all labs ordered are listed, but only abnormal results are displayed)  Labs Reviewed  POC URINE PREG, ED  POCT PREGNANCY, URINE   ____________________________________________  EKG   ____________________________________________  RADIOLOGY  No acute findings x-ray of the coccyx. ____________________________________________   PROCEDURES  Procedure(s) performed: None  Procedures  Critical Care performed: No  ____________________________________________   INITIAL IMPRESSION / ASSESSMENT AND PLAN / ED COURSE  Pertinent labs & imaging results that were available during my care of the patient were reviewed by me and considered in my medical decision making (see chart  for details).  Coccyx contusion. Discussed negative x-ray finding with patient. Patient given discharge care instructions. Patient advised follow-up family doctor if condition persists.      ____________________________________________   FINAL CLINICAL IMPRESSION(S) / ED DIAGNOSES  Final diagnoses:  Coccyx contusion, initial encounter      NEW MEDICATIONS STARTED DURING THIS VISIT:  New Prescriptions   METHOCARBAMOL (ROBAXIN-750) 750 MG TABLET    Take 1 tablet (750 mg total) by mouth 4 (four) times daily.   NAPROXEN (NAPROSYN) 500 MG TABLET    Take 1 tablet (500 mg total) by mouth 2 (two) times daily with a meal.   TRAMADOL (ULTRAM) 50 MG TABLET    Take 1 tablet (50 mg total) by mouth  every 6 (six) hours as needed.     Note:  This document was prepared using Dragon voice recognition software and may include unintentional dictation errors.    Joni ReiningSmith, Itxel Wickard K, PA-C 03/25/17 2048    Rockne MenghiniNorman, Anne-Caroline, MD 03/25/17 [phone]

## 2017-03-25 NOTE — ED Notes (Signed)
Urine preg negative

## 2017-03-25 NOTE — ED Triage Notes (Signed)
Pt slipped and fell Monday in rain on steps to deck.  Pain to tailbone and right arm/shoulder.  Ambulatory to triage.  NAD at this time.  No obvious deformity.

## 2017-05-12 DIAGNOSIS — R102 Pelvic and perineal pain: Secondary | ICD-10-CM | POA: Diagnosis not present

## 2017-05-12 DIAGNOSIS — N802 Endometriosis of fallopian tube: Secondary | ICD-10-CM | POA: Diagnosis not present

## 2017-05-13 DIAGNOSIS — Z882 Allergy status to sulfonamides status: Secondary | ICD-10-CM | POA: Diagnosis not present

## 2017-05-13 DIAGNOSIS — W230XXA Caught, crushed, jammed, or pinched between moving objects, initial encounter: Secondary | ICD-10-CM | POA: Diagnosis not present

## 2017-05-13 DIAGNOSIS — S60221A Contusion of right hand, initial encounter: Secondary | ICD-10-CM | POA: Diagnosis not present

## 2017-05-13 DIAGNOSIS — R600 Localized edema: Secondary | ICD-10-CM | POA: Diagnosis not present

## 2017-05-13 DIAGNOSIS — R202 Paresthesia of skin: Secondary | ICD-10-CM | POA: Diagnosis not present

## 2017-05-13 DIAGNOSIS — M79644 Pain in right finger(s): Secondary | ICD-10-CM | POA: Diagnosis not present

## 2017-05-13 DIAGNOSIS — S63652A Sprain of metacarpophalangeal joint of right middle finger, initial encounter: Secondary | ICD-10-CM | POA: Diagnosis not present

## 2017-05-13 DIAGNOSIS — S60031A Contusion of right middle finger without damage to nail, initial encounter: Secondary | ICD-10-CM | POA: Diagnosis not present

## 2017-05-15 DIAGNOSIS — S63632A Sprain of interphalangeal joint of right middle finger, initial encounter: Secondary | ICD-10-CM | POA: Diagnosis not present

## 2017-05-15 DIAGNOSIS — S63630A Sprain of interphalangeal joint of right index finger, initial encounter: Secondary | ICD-10-CM | POA: Diagnosis not present

## 2017-06-03 DIAGNOSIS — R102 Pelvic and perineal pain: Secondary | ICD-10-CM | POA: Diagnosis not present

## 2017-06-03 DIAGNOSIS — N802 Endometriosis of fallopian tube: Secondary | ICD-10-CM | POA: Diagnosis not present

## 2017-07-13 ENCOUNTER — Encounter: Payer: Self-pay | Admitting: Emergency Medicine

## 2017-07-13 ENCOUNTER — Emergency Department
Admission: EM | Admit: 2017-07-13 | Discharge: 2017-07-13 | Disposition: A | Discharge status: Home / Self Care | Payer: BLUE CROSS/BLUE SHIELD | Attending: Emergency Medicine | Admitting: Emergency Medicine

## 2017-07-13 DIAGNOSIS — K0889 Other specified disorders of teeth and supporting structures: Secondary | ICD-10-CM | POA: Insufficient documentation

## 2017-07-13 DIAGNOSIS — Z87891 Personal history of nicotine dependence: Secondary | ICD-10-CM | POA: Insufficient documentation

## 2017-07-13 MED ORDER — KETOROLAC TROMETHAMINE 10 MG PO TABS: 10.0000 mg | ORAL_TABLET | Freq: Four times a day (QID) | ORAL | 0 refills | Status: AC | PRN

## 2017-07-13 MED ORDER — LIDOCAINE VISCOUS 2 % MT SOLN
15.0000 mL | Freq: Once | OROMUCOSAL | Status: AC
Start: 2017-07-13 — End: 2017-07-13
  Administered 2017-07-13: 15 mL via OROMUCOSAL
  Filled 2017-07-13: qty 15

## 2017-07-13 MED ORDER — AMOXICILLIN 500 MG PO CAPS: 500.0000 mg | ORAL_CAPSULE | Freq: Three times a day (TID) | ORAL | 1 refills | Status: AC

## 2017-07-13 MED ORDER — KETOROLAC TROMETHAMINE 30 MG/ML IJ SOLN
30.0000 mg | Freq: Once | INTRAMUSCULAR | Status: AC
  Administered 2017-07-13: 30 mg via INTRAMUSCULAR
  Filled 2017-07-13: qty 1

## 2017-07-13 MED ORDER — AMOXICILLIN 500 MG PO CAPS
500.0000 mg | ORAL_CAPSULE | Freq: Once | ORAL | Status: AC
  Administered 2017-07-13: 500 mg via ORAL
  Filled 2017-07-13: qty 1

## 2017-07-13 NOTE — Discharge Instructions (Signed)
OPTIONS FOR DENTAL FOLLOW UP CARE ° °[place] Department of Health and Human Services - Local Safety Net Dental Clinics °http://www.ncdhhs.gov/dph/oralhealth/services/safetynetclinics.htm °  °[facility] (336-562-3123) ° °[facility] [place] (919-933-9087) ° °[facility] ([phone] ext [phone]) ° °[facility] ([phone]) ° °[facility] ([phone]) °This clinic caters to the indigent population and is on a lottery system. °Location: °[facility], [address], [place] °Clinic Hours: °Wednesdays from 6pm - 9pm, patients seen by a lottery system. °For dates, call or go to www.med.[facility].edu/[facility]/patients/Dental-[facility] °Services: °Cleanings, fillings and simple extractions. °Payment Options: °DENTAL WORK IS FREE OF CHARGE. Bring proof of income or support. °Best way to get seen: °Arrive at 5:15 pm - this is a lottery, NOT first come/first serve, so arriving earlier will not increase your chances of being seen. °  °  °[facility] °[phone] °Select option 1 for emergencies °  °Location: °[facility], [address], [place] °Clinic Hours: °No walk-ins accepted - call the day before to schedule an appointment. °Check in times are 9:30 am and 1:30 pm. °Services: °Simple extractions, temporary fillings, pulpectomy/pulp debridement, uncomplicated abscess drainage. °Payment Options: °PAYMENT IS DUE AT THE TIME OF SERVICE.  Fee is usually $100-200, additional surgical procedures (e.g. abscess drainage) may be extra. °Cash, checks, Visa/MasterCard accepted.  Can file Medicaid if patient is covered for dental - patient should call case worker to check. °No discount for [facility] patients. °Best way to get seen: °MUST call the day before and get onto the schedule. Can usually be seen the next 1-2 days. No walk-ins accepted. °  °  °[facility] °[phone] °   °Location: °[facility], [address], [place] °Clinic Hours: °M, W, Th, F 8am or 1:30pm, Tues 9a or 1:30 - first come/first served. °Services: °Simple extractions, temporary fillings, uncomplicated abscess drainage.  You do not need to be an [place] resident. °Payment Options: °PAYMENT IS DUE AT THE TIME OF SERVICE. °Dental insurance, otherwise sliding scale - bring proof of income or support. °Depending on income and treatment needed, cost is usually $50-200. °Best way to get seen: °Arrive early as it is first come/first served. °  °  °[facility] °[phone] °  °Location: °[address] °Clinic Hours: °Mon-Thu 8a-5p °Services: °Most basic dental services including extractions and fillings. °Payment Options: °PAYMENT IS DUE AT THE TIME OF SERVICE. °Sliding scale, up to 50% off - bring proof if income or support. °Medicaid with dental option accepted. °Best way to get seen: °Call to schedule an appointment, can usually be seen within 2 weeks OR they will try to see walk-ins - show up at 8a or 2p (you may have to wait). °  °  °[facility] °919-245-2435 °[place] RESIDENTS ONLY °  °Location: °[facility], [address], [place], [place] [address] °Clinic Hours: By appointment only. °Monday - Thursday 8am-5pm, Friday 8am-12pm °Services: Cleanings, fillings, extractions. °Payment Options: °PAYMENT IS DUE AT THE TIME OF SERVICE. °Cash, Visa or MasterCard. Sliding scale - $30 minimum per service. °Best way to get seen: °Come in to office, complete packet and make an appointment - need proof of income °or support monies for each household member and proof of [place] residence. °Usually takes about a month to get in. °  °  °[facility] °[phone] °  °Location: °[address]   [place] °Clinic Hours: Walk-in Urgent Care Dental Services are offered Monday-Friday  mornings only. °The numbers of emergencies accepted daily is limited to the number of °providers available. °Maximum 15 - Mondays, Wednesdays & Thursdays °Maximum 10 - Tuesdays & Fridays °Services: °You do not need to be a [place] resident to be seen for a dental emergency. °Emergencies are defined as pain, swelling, abnormal bleeding, or dental trauma. Walkins will receive x-rays if needed. °NOTE: Dental cleaning is not an emergency. °Payment Options: °PAYMENT IS DUE AT THE TIME OF SERVICE. °Minimum co-pay is $40.00 for uninsured patients. °Minimum co-pay is $3.00 for Medicaid with dental coverage. °Dental Insurance is accepted and must be presented at time of visit. °Medicare does not cover dental. °Forms of payment: Cash, credit card, checks. °Best way to get seen: °If not previously registered with the clinic, walk-in dental registration begins at 7:15 am and is on a first come/first serve basis. °If previously registered with the clinic, call to make an appointment. °  °  °[facility] °[phone] °[place] RESIDENTS ONLY °  °Location: °[address], [place], [place] °Clinic Hours: °Mon-Thu 10a-2p °Services: Extractions only! °Payment Options: °FREE (donations accepted) - bring proof of income or support °Best way to get seen: °Call and schedule an appointment OR come at 8am on the 1st Monday of every month (except for holidays) when it is first come/first served. °  °  °[facility] °[phone] °  °Location: °[address], [place] °Clinic Hours: °Friday mornings °Services, Payment Options, Best way to get seen: °Call for info °

## 2017-07-13 NOTE — ED Notes (Signed)

## 2017-07-13 NOTE — ED Provider Notes (Signed)
[facility] Emergency Department Provider Note  ____________________________________________  Time seen: Approximately 9:11 PM  I have reviewed the triage vital signs and the nursing notes.   HISTORY  Chief Complaint Dental Pain    HPI [name] is a 41 y.o. female presents to the emergency department with 10/10 nonspecific right upper dental pain for the past 3 days. Patient states that she has not made an appointment with a local dentist as they have "shut down". She denies fever, chills, gingival hypertrophy or edema of the upper jaw. No alleviating measures have been attempted.    Past Medical History:  Diagnosis Date  . Arthritis    low back and neck  . Back pain of lumbar region with sciatica   . Chronic kidney disease    kidney stones and infection  . Degenerative disc disease, cervical   . Family history of adverse reaction to anesthesia    "unable to put mother to sleep"  . GERD (gastroesophageal reflux disease)   . Lichen   . Menorrhagia   . Ovarian cyst     Patient Active Problem List   Diagnosis Date Noted  . Post-operative state 07/07/2016  . Cholelithiasis 04/10/2015    Past Surgical History:  Procedure Laterality Date  . ANKLE SURGERY Left   . CESAREAN SECTION    . CHOLECYSTECTOMY N/A 04/10/2015   Procedure: LAPAROSCOPIC CHOLECYSTECTOMY WITH INTRAOPERATIVE CHOLANGIOGRAM;  Surgeon: Lattie Haw, MD;  Location: [facility] ORS;  Service: General;  Laterality: N/A;  . ESOPHAGOGASTRODUODENOSCOPY (EGD) WITH PROPOFOL N/A 12/12/2016   Procedure: ESOPHAGOGASTRODUODENOSCOPY (EGD) WITH PROPOFOL;  Surgeon: Christena Deem, MD;  Location: [facility] ENDOSCOPY;  Service: Endoscopy;  Laterality: N/A;  . LAPAROSCOPIC VAGINAL HYSTERECTOMY WITH SALPINGECTOMY Bilateral 07/07/2016   Procedure: LAPAROSCOPIC ASSISTED VAGINAL HYSTERECTOMY WITH SALPINGECTOMY;  Surgeon: Suzy Bouchard, MD;  Location: [facility] ORS;  Service: Gynecology;  Laterality:  Bilateral;    Prior to Admission medications   Medication Sig Start Date End Date Taking? Authorizing Provider  amoxicillin (AMOXIL) 500 MG capsule Take 1 capsule (500 mg total) by mouth 3 (three) times daily. 07/13/17 07/23/17  Orvil Feil, PA-C  ibuprofen (ADVIL) 200 MG tablet Take 3 tablets (600 mg total) by mouth every 6 (six) hours as needed for mild pain. 07/08/16   Schermerhorn, Ihor Austin, MD  ketorolac (TORADOL) 10 MG tablet Take 1 tablet (10 mg total) by mouth every 6 (six) hours as needed. 07/13/17 07/18/17  Orvil Feil, PA-C  pantoprazole (PROTONIX) 40 MG tablet Take 40 mg by mouth daily as needed.     [provider]    Allergies Sulfa antibiotics  Family History  Problem Relation Age of Onset  . Hypertension Mother   . Gallbladder disease Maternal Grandmother     Social History Social History  Substance Use Topics  . Smoking status: Former Smoker    Quit date: 04/09/1997  . Smokeless tobacco: Never Used  . Alcohol use No     Review of Systems  Constitutional: No fever/chills Eyes: No visual changes. No discharge ENT: Patient has dental pain.  Cardiovascular: no chest pain. Respiratory: no cough. No SOB. Musculoskeletal: Negative for musculoskeletal pain. Skin: Negative for rash, abrasions, lacerations, ecchymosis. Neurological: Negative for headaches, focal weakness or numbness.   ____________________________________________   PHYSICAL EXAM:  VITAL SIGNS: ED Triage Vitals  Enc Vitals Group     BP 07/13/17 1851 129/90     Pulse Rate 07/13/17 1851 75     Resp 07/13/17 1851  20     Temp 07/13/17 1851 98.3 F (36.8 C)     Temp Source 07/13/17 1851 Oral     SpO2 07/13/17 1851 99 %     Weight 07/13/17 1850 150 lb (68 kg)     Height --      Head Circumference --      Peak Flow --      Pain Score 07/13/17 1849 8     Pain Loc --      Pain Edu? --      Excl. in GC? --      Constitutional: Alert and oriented. Well appearing and in no acute  distress. Eyes: Conjunctivae are normal. PERRL. EOMI. Head: Atraumatic. Ears: Tympanic membranes are pearly bilaterally.      Nose: No congestion/rhinnorhea.      Mouth/Throat: Mucous membranes are moist. Dental carries visualized at superior one into. No gingival hypertrophy or edema. Cardiovascular: Normal rate, regular rhythm. Normal S1 and S2.  Good peripheral circulation. Respiratory: Normal respiratory effort without tachypnea or retractions. Lungs CTAB. Good air entry to the bases with no decreased or absent breath sounds. Skin:  Skin is warm, dry and intact. No rash noted. Psychiatric: Mood and affect are normal. Speech and behavior are normal. Patient exhibits appropriate insight and judgement.   ____________________________________________   LABS (all labs ordered are listed, but only abnormal results are displayed)  Labs Reviewed - No data to display ____________________________________________  EKG   ____________________________________________  RADIOLOGY  No results found.  ____________________________________________    PROCEDURES  Procedure(s) performed:    Procedures    Medications  ketorolac (TORADOL) 30 MG/ML injection 30 mg (not administered)  lidocaine (XYLOCAINE) 2 % viscous mouth solution 15 mL (not administered)  amoxicillin (AMOXIL) capsule 500 mg (not administered)     ____________________________________________   INITIAL IMPRESSION / ASSESSMENT AND PLAN / ED COURSE  Pertinent labs & imaging results that were available during my care of the patient were reviewed by me and considered in my medical decision making (see chart for details).  Review of the [place] CSRS was performed in accordance of the [facility] prior to dispensing any controlled drugs.     Assessment and Plan: Dental pain Patient presents to the emergency department with superior one and two pain. Viscous lidocaine was given in the emergency department as well as  amoxicillin. Toradol was also given. She was discharged with Toradol and amoxicillin. Vital signs are reassuring at Discharge. All patient questions were answered. Patient was advised to seek care with a local dentist as soon as possible.   ____________________________________________  FINAL CLINICAL IMPRESSION(S) / ED DIAGNOSES  Final diagnoses:  Pain, dental      NEW MEDICATIONS STARTED DURING THIS VISIT:  New Prescriptions   AMOXICILLIN (AMOXIL) 500 MG CAPSULE    Take 1 capsule (500 mg total) by mouth 3 (three) times daily.   KETOROLAC (TORADOL) 10 MG TABLET    Take 1 tablet (10 mg total) by mouth every 6 (six) hours as needed.        This chart was dictated using voice recognition software/Dragon. Despite best efforts to proofread, errors can occur which can change the meaning. Any change was purely unintentional.    Gasper Lloyd 07/13/17 2116    Nita Sickle, MD 07/13/17 [phone]

## 2017-07-13 NOTE — ED Triage Notes (Signed)
Pt with right upper dental pain for a few days.

## 2017-07-14 NOTE — H&P (Signed)
Ms. Klausner is a 41 y.o. female here for L/S right oophorectomy , possible left oophorectomy  .pt is s/p LAVH and bilat salpingectomy . = endosalpingioisis Pt with CPP right >> left( long standing ) . Now with dyspareunia and on seasonale  Helping only 5 % S/p Lupron tx  U/S recent shows  bilateral simple ovarian cysts , largest 7 mm  Past Medical History:  has a past medical history of Chickenpox ( ); Chronic gastritis (12/12/2016); Constipation due to slow transit; Degenerative disc disease, cervical; Ovarian cyst; and Reflux esophagitis (12/12/2016).  Past Surgical History:  has a past surgical history that includes Cesarean section; ankle surgery, left; Cholecystectomy; Hysterectomy (07/07/2016); LAVH with b/l salpingectomy with excision of endoslapingosis; and egd (12/12/2016). Family History: family history includes High blood pressure (Hypertension) in her mother. Social History:  reports that she has quit smoking. She has never used smokeless tobacco. She reports that she does not drink alcohol or use drugs. OB/GYN History:          OB History    Gravida Para Term Preterm AB Living   0 0 1   SAB TAB Ectopic Molar Multiple Live Births   0 0 0   0 1      Allergies: is allergic to sulfa (sulfonamide antibiotics). Medications:  Current Outpatient Prescriptions:  .  HYDROcodone-acetaminophen (NORCO) 5-325 mg tablet, Take 1 tab po every 6 hours prn pain (Patient not taking: Reported on 05/12/2017 ), Disp: 20 tablet, Rfl: 0 .  levonorgestrel-ethinyl estradiol (SEASONALE) 0.15 mg-30 mcg tablet, Take 1 tablet by mouth once daily., Disp: 84 tablet, Rfl: 3 .  pantoprazole (PROTONIX) 40 MG DR tablet, Take 1 tablet (40 mg total) by mouth 2 (two) times daily., Disp: 60 tablet, Rfl: 3  Review of Systems: General:                      No fatigue or weight loss Eyes:                           No vision changes Ears:                            No hearing  difficulty Respiratory:                No cough or shortness of breath Pulmonary:                  No asthma or shortness of breath Cardiovascular:           No chest pain, palpitations, dyspnea on exertion Gastrointestinal:          No abdominal bloating, chronic diarrhea, constipations, masses, pain or hematochezia Genitourinary:             No hematuria, dysuria, abnormal vaginal discharge, pelvic pain, Menometrorrhagia Lymphatic:                   No swollen lymph nodes Musculoskeletal:         No muscle weakness Neurologic:                  No extremity weakness, syncope, seizure disorder Psychiatric:                  No history of depression, delusions or suicidal/homicidal ideation    Exam:      Vitals:  07/15/17 1609  BP: 105/77  Pulse: 62    Body mass index is 27.62 kg/m.  WDWN white/ female in NAD   Lungs: CTA  CV : RRR without murmur   Neck:  no thyromegaly Abdomen: soft , no mass, normal active bowel sounds,  non-tender, no rebound tenderness pelvic : v/v nl d/c  cx : absent  Utx : absent  +/- right adnexal TTP without mass   Impression:   Chronic pelvic pain right >> left  H/o endosalpingiosis and conservative treatment  with Lupron and seasonale has not worked .    Plan:  I have spoken with the patient regarding treatment options including expectant management, hormonal options, continuation of OCPs , or surgical intervention. After a full discussion the pt elects to proceed with Schedule pt for L/S right oophorectomy , possible left oophorectomy  if significant adhesions are found      Vilma Prader, MD

## 2017-07-15 ENCOUNTER — Encounter
Admission: RE | Admit: 2017-07-15 | Discharge: 2017-07-15 | Disposition: A | Discharge status: Home / Self Care | Payer: BLUE CROSS/BLUE SHIELD | Source: Ambulatory Visit | Attending: Obstetrics and Gynecology | Admitting: Obstetrics and Gynecology

## 2017-07-15 DIAGNOSIS — Z01812 Encounter for preprocedural laboratory examination: Secondary | ICD-10-CM | POA: Diagnosis not present

## 2017-07-15 DIAGNOSIS — G8929 Other chronic pain: Secondary | ICD-10-CM | POA: Diagnosis not present

## 2017-07-15 DIAGNOSIS — Z9889 Other specified postprocedural states: Secondary | ICD-10-CM | POA: Insufficient documentation

## 2017-07-15 DIAGNOSIS — R102 Pelvic and perineal pain: Secondary | ICD-10-CM | POA: Insufficient documentation

## 2017-07-15 DIAGNOSIS — Z9049 Acquired absence of other specified parts of digestive tract: Secondary | ICD-10-CM | POA: Insufficient documentation

## 2017-07-15 DIAGNOSIS — Z882 Allergy status to sulfonamides status: Secondary | ICD-10-CM | POA: Insufficient documentation

## 2017-07-15 DIAGNOSIS — Z79899 Other long term (current) drug therapy: Secondary | ICD-10-CM | POA: Diagnosis not present

## 2017-07-15 DIAGNOSIS — Z79891 Long term (current) use of opiate analgesic: Secondary | ICD-10-CM | POA: Diagnosis not present

## 2017-07-15 DIAGNOSIS — M5030 Other cervical disc degeneration, unspecified cervical region: Secondary | ICD-10-CM | POA: Insufficient documentation

## 2017-07-15 DIAGNOSIS — Z90710 Acquired absence of both cervix and uterus: Secondary | ICD-10-CM | POA: Insufficient documentation

## 2017-07-15 LAB — BASIC METABOLIC PANEL
ANION GAP: 6 (ref 5–15)
BUN: 14 mg/dL (ref 6–20)
CALCIUM: 9.4 mg/dL (ref 8.9–10.3)
CO2: 29 mmol/L (ref 22–32)
Chloride: 103 mmol/L (ref 101–111)
Creatinine, Ser: 0.7 mg/dL (ref 0.44–1.00)
GFR calc Af Amer: >60 mL/min (ref >60)
Glucose, Bld: 88 mg/dL (ref 65–99)
Potassium: 3.6 mmol/L (ref 3.5–5.1)
SODIUM: 138 mmol/L (ref 135–145)

## 2017-07-15 LAB — TYPE AND SCREEN
ABO/RH(D): O POS
ANTIBODY SCREEN: NEGATIVE

## 2017-07-15 LAB — CBC
HCT: 38.9 % (ref 35.0–47.0)
HEMOGLOBIN: 13.7 g/dL (ref 12.0–16.0)
MCH: 29.8 pg (ref 26.0–34.0)
MCHC: 35.2 g/dL (ref 32.0–36.0)
MCV: 84.5 fL (ref 80.0–100.0)
Platelets: 244 10*3/uL (ref 150–440)
RBC: 4.61 MIL/uL (ref 3.80–5.20)
RDW: 12.7 % (ref 11.5–14.5)
WBC: 5.4 10*3/uL (ref 3.6–11.0)

## 2017-07-15 NOTE — Patient Instructions (Signed)
Your procedure is scheduled on: Monday, July 20, 2017 Report to Same Day Surgery on the 2nd floor [facility]. To find out your arrival time, please call [phone] between 1PM - 3PM on: Friday, July 17, 2017  REMEMBER: Instructions that are not followed completely may result in serious medical risk up to and including death; or upon the discretion of your surgeon and anesthesiologist your surgery may need to be rescheduled.  Do not eat food or drink liquids after midnight. No gum chewing or hard candies.  You may however, drink CLEAR liquids up to 2 hours before you are scheduled to arrive at the hospital for your procedure.  Do not drink clear liquids within 2 hours of your scheduled arrival to the hospital as this may lead to your procedure being delayed or rescheduled.  Clear liquids include: - water  - apple juice without pulp - clear gatorade - black coffee or tea (NO milk, creamers, sugars) DO NOT drink anything not on this list.  Type 1 and Type 2 diabetics should only drink water.  No Alcohol for 24 hours before or after surgery.  No Smoking for 24 hours prior to surgery.  Notify your doctor if there is any change in your medical condition (cold, fever, infection).  Do not wear jewelry, make-up, hairpins, clips or nail polish.  Do not wear lotions, powders, or perfumes.   Do not shave 48 hours prior to surgery. Men may shave face and neck.  Contacts and dentures may not be worn into surgery.  Do not bring valuables to the hospital. [facility] is not responsible for any belongings or valuables.   TAKE THESE MEDICATIONS THE MORNING OF SURGERY WITH A SIP OF WATER:  1. PROTONIX (TAKE ONE THE NIGHT BEFORE SURGERY AND ONE THE MORNING OF SURGERY) - helps to prevent nausea after surgery   Use CHG Soap as directed on instruction sheet.  Fleets enema as directed.  Stop Anti-inflammatories such as Advil, Aleve, Ibuprofen, Motrin, Naproxen, Naprosyn,  Goodie powder, or aspirin products. (May take Tylenol if needed.)  Stop supplements until after surgery.   If you are being admitted to the hospital overnight, leave your suitcase in the car. After surgery it may be brought to your room.  If you are being discharged the day of surgery, you will not be allowed to drive home. You will need someone to drive you home and stay with you that night.   If you are taking public transportation, you will need to have a responsible adult to with you.  Please call the number above if you have any questions about these instructions.

## 2017-07-20 ENCOUNTER — Encounter
Admission: RE | Disposition: A | Discharge status: Home / Self Care | Payer: Self-pay | Source: Ambulatory Visit | Attending: Obstetrics and Gynecology

## 2017-07-20 ENCOUNTER — Ambulatory Visit: Payer: BLUE CROSS/BLUE SHIELD | Admitting: Anesthesiology

## 2017-07-20 ENCOUNTER — Ambulatory Visit
Admission: RE | Admit: 2017-07-20 | Discharge: 2017-07-20 | Disposition: A | Discharge status: Home / Self Care | Payer: BLUE CROSS/BLUE SHIELD | Source: Ambulatory Visit | Attending: Obstetrics and Gynecology | Admitting: Obstetrics and Gynecology

## 2017-07-20 ENCOUNTER — Encounter: Payer: Self-pay | Admitting: Emergency Medicine

## 2017-07-20 DIAGNOSIS — N803 Endometriosis of pelvic peritoneum: Secondary | ICD-10-CM | POA: Insufficient documentation

## 2017-07-20 DIAGNOSIS — Z8619 Personal history of other infectious and parasitic diseases: Secondary | ICD-10-CM | POA: Insufficient documentation

## 2017-07-20 DIAGNOSIS — Z87891 Personal history of nicotine dependence: Secondary | ICD-10-CM | POA: Insufficient documentation

## 2017-07-20 DIAGNOSIS — R102 Pelvic and perineal pain: Secondary | ICD-10-CM | POA: Diagnosis not present

## 2017-07-20 DIAGNOSIS — N809 Endometriosis, unspecified: Secondary | ICD-10-CM | POA: Diagnosis not present

## 2017-07-20 DIAGNOSIS — N8301 Follicular cyst of right ovary: Secondary | ICD-10-CM | POA: Diagnosis not present

## 2017-07-20 DIAGNOSIS — K219 Gastro-esophageal reflux disease without esophagitis: Secondary | ICD-10-CM | POA: Insufficient documentation

## 2017-07-20 DIAGNOSIS — G8929 Other chronic pain: Secondary | ICD-10-CM | POA: Diagnosis not present

## 2017-07-20 DIAGNOSIS — N9489 Other specified conditions associated with female genital organs and menstrual cycle: Secondary | ICD-10-CM | POA: Insufficient documentation

## 2017-07-20 DIAGNOSIS — N838 Other noninflammatory disorders of ovary, fallopian tube and broad ligament: Secondary | ICD-10-CM | POA: Diagnosis not present

## 2017-07-20 SURGERY — OOPHORECTOMY, LAPAROSCOPIC
Anesthesia: General | Laterality: Right

## 2017-07-20 MED ORDER — FENTANYL CITRATE (PF) 100 MCG/2ML IJ SOLN: 25.0000 ug | INTRAMUSCULAR | Status: DC | PRN

## 2017-07-20 MED ORDER — MIDAZOLAM HCL 2 MG/2ML IJ SOLN
INTRAMUSCULAR | Status: AC
  Filled 2017-07-20: qty 2

## 2017-07-20 MED ORDER — ONDANSETRON HCL 4 MG/2ML IJ SOLN
INTRAMUSCULAR | Status: DC | PRN
  Administered 2017-07-20: 4 mg via INTRAVENOUS

## 2017-07-20 MED ORDER — SODIUM CHLORIDE 0.9 % IJ SOLN
INTRAMUSCULAR | Status: AC
  Filled 2017-07-20: qty 10

## 2017-07-20 MED ORDER — DEXAMETHASONE SODIUM PHOSPHATE 10 MG/ML IJ SOLN
INTRAMUSCULAR | Status: DC | PRN
  Administered 2017-07-20: 5 mg via INTRAVENOUS

## 2017-07-20 MED ORDER — KETOROLAC TROMETHAMINE 30 MG/ML IJ SOLN
INTRAMUSCULAR | Status: DC | PRN
  Administered 2017-07-20: 15 mg via INTRAVENOUS

## 2017-07-20 MED ORDER — ACETAMINOPHEN 10 MG/ML IV SOLN
INTRAVENOUS | Status: DC | PRN
  Administered 2017-07-20: 1000 mg via INTRAVENOUS

## 2017-07-20 MED ORDER — SUCCINYLCHOLINE CHLORIDE 20 MG/ML IJ SOLN
INTRAMUSCULAR | Status: DC | PRN
  Administered 2017-07-20: 100 mg via INTRAVENOUS

## 2017-07-20 MED ORDER — EPHEDRINE SULFATE 50 MG/ML IJ SOLN
INTRAMUSCULAR | Status: DC | PRN
  Administered 2017-07-20: 10 mg via INTRAVENOUS

## 2017-07-20 MED ORDER — BUPIVACAINE HCL 0.5 % IJ SOLN
INTRAMUSCULAR | Status: DC | PRN
  Administered 2017-07-20: 10 mL

## 2017-07-20 MED ORDER — FENTANYL CITRATE (PF) 100 MCG/2ML IJ SOLN
INTRAMUSCULAR | Status: DC | PRN
  Administered 2017-07-20 (×2): 50 ug via INTRAVENOUS

## 2017-07-20 MED ORDER — FENTANYL CITRATE (PF) 100 MCG/2ML IJ SOLN
INTRAMUSCULAR | Status: AC
  Filled 2017-07-20: qty 2

## 2017-07-20 MED ORDER — DEXAMETHASONE SODIUM PHOSPHATE 10 MG/ML IJ SOLN
INTRAMUSCULAR | Status: AC
  Filled 2017-07-20: qty 1

## 2017-07-20 MED ORDER — HYDROCODONE-ACETAMINOPHEN 5-325 MG PO TABS
1.0000 | ORAL_TABLET | Freq: Four times a day (QID) | ORAL | Status: AC | PRN
  Administered 2017-07-20: 1 via ORAL

## 2017-07-20 MED ORDER — ONDANSETRON HCL 4 MG/2ML IJ SOLN
INTRAMUSCULAR | Status: AC
  Filled 2017-07-20: qty 2

## 2017-07-20 MED ORDER — BUPIVACAINE HCL (PF) 0.5 % IJ SOLN
INTRAMUSCULAR | Status: AC
  Filled 2017-07-20: qty 30

## 2017-07-20 MED ORDER — LACTATED RINGERS IV SOLN
INTRAVENOUS | Status: DC
  Administered 2017-07-20: 11:00:00 via INTRAVENOUS

## 2017-07-20 MED ORDER — SILVER NITRATE-POT NITRATE 75-25 % EX MISC
CUTANEOUS | Status: AC
  Filled 2017-07-20: qty 1

## 2017-07-20 MED ORDER — ACETAMINOPHEN 10 MG/ML IV SOLN
INTRAVENOUS | Status: AC
  Filled 2017-07-20: qty 100

## 2017-07-20 MED ORDER — LIDOCAINE HCL (PF) 2 % IJ SOLN
INTRAMUSCULAR | Status: AC
  Filled 2017-07-20: qty 2

## 2017-07-20 MED ORDER — MIDAZOLAM HCL 2 MG/2ML IJ SOLN
INTRAMUSCULAR | Status: DC | PRN
  Administered 2017-07-20: 2 mg via INTRAVENOUS

## 2017-07-20 MED ORDER — KETOROLAC TROMETHAMINE 30 MG/ML IJ SOLN
INTRAMUSCULAR | Status: AC
  Filled 2017-07-20: qty 1

## 2017-07-20 MED ORDER — PROMETHAZINE HCL 25 MG/ML IJ SOLN
12.5000 mg | Freq: Once | INTRAMUSCULAR | Status: AC
  Administered 2017-07-20: 12.5 mg via INTRAVENOUS

## 2017-07-20 MED ORDER — PROMETHAZINE HCL 25 MG/ML IJ SOLN
INTRAMUSCULAR | Status: AC
  Administered 2017-07-20: 12.5 mg via INTRAVENOUS
  Filled 2017-07-20: qty 1

## 2017-07-20 MED ORDER — FLEET ENEMA 7-19 GM/118ML RE ENEM: 1.0000 | ENEMA | Freq: Once | RECTAL | Status: DC

## 2017-07-20 MED ORDER — ONDANSETRON HCL 4 MG/2ML IJ SOLN
4.0000 mg | Freq: Once | INTRAMUSCULAR | Status: AC | PRN
  Administered 2017-07-20: 4 mg via INTRAVENOUS

## 2017-07-20 MED ORDER — ROCURONIUM BROMIDE 50 MG/5ML IV SOLN
INTRAVENOUS | Status: AC
  Filled 2017-07-20: qty 1

## 2017-07-20 MED ORDER — SUCCINYLCHOLINE CHLORIDE 20 MG/ML IJ SOLN
INTRAMUSCULAR | Status: AC
  Filled 2017-07-20: qty 1

## 2017-07-20 MED ORDER — HYDROCODONE-ACETAMINOPHEN 5-325 MG PO TABS
ORAL_TABLET | ORAL | Status: AC
  Filled 2017-07-20: qty 1

## 2017-07-20 MED ORDER — LIDOCAINE HCL (CARDIAC) 20 MG/ML IV SOLN
INTRAVENOUS | Status: DC | PRN
  Administered 2017-07-20: 80 mg via INTRAVENOUS

## 2017-07-20 MED ORDER — PROPOFOL 10 MG/ML IV BOLUS
INTRAVENOUS | Status: DC | PRN
  Administered 2017-07-20: 160 mg via INTRAVENOUS

## 2017-07-20 MED ORDER — ROCURONIUM BROMIDE 100 MG/10ML IV SOLN
INTRAVENOUS | Status: DC | PRN
  Administered 2017-07-20: 5 mg via INTRAVENOUS

## 2017-07-20 SURGICAL SUPPLY — 37 items
BAG SPEC RTRVL LRG 6X4 10 (ENDOMECHANICALS) ×1
BAG URO DRAIN 2000ML W/SPOUT (MISCELLANEOUS) ×2 IMPLANT
BLADE SURG SZ11 CARB STEEL (BLADE) ×2 IMPLANT
CANISTER SUCT 1200ML W/VALVE (MISCELLANEOUS) ×2 IMPLANT
CATH ROBINSON RED A/P 16FR (CATHETERS) ×2 IMPLANT
CHLORAPREP W/TINT 26ML (MISCELLANEOUS) ×2 IMPLANT
DEVICE PMI PUNCTURE CLOSURE (MISCELLANEOUS) ×2 IMPLANT
DRSG TEGADERM 2-3/8X2-3/4 SM (GAUZE/BANDAGES/DRESSINGS) ×2 IMPLANT
GAUZE SPONGE NON-WVN 2X2 STRL (MISCELLANEOUS) ×1 IMPLANT
GLOVE BIO SURGEON STRL SZ8 (GLOVE) ×4 IMPLANT
GOWN STRL REUS W/ TWL LRG LVL3 (GOWN DISPOSABLE) ×2 IMPLANT
GOWN STRL REUS W/ TWL XL LVL3 (GOWN DISPOSABLE) ×1 IMPLANT
GOWN STRL REUS W/TWL LRG LVL3 (GOWN DISPOSABLE) ×4
GOWN STRL REUS W/TWL XL LVL3 (GOWN DISPOSABLE) ×2
IRRIGATION STRYKERFLOW (MISCELLANEOUS) ×1 IMPLANT
IRRIGATOR STRYKERFLOW (MISCELLANEOUS) ×2
IV NS 1000ML (IV SOLUTION) ×2
IV NS 1000ML BAXH (IV SOLUTION) ×1 IMPLANT
KIT RM TURNOVER CYSTO AR (KITS) ×2 IMPLANT
LABEL OR SOLS (LABEL) ×2 IMPLANT
NS IRRIG 500ML POUR BTL (IV SOLUTION) ×2 IMPLANT
PACK GYN LAPAROSCOPIC (MISCELLANEOUS) ×2 IMPLANT
PAD OB MATERNITY 4.3X12.25 (PERSONAL CARE ITEMS) ×2 IMPLANT
PAD PREP 24X41 OB/GYN DISP (PERSONAL CARE ITEMS) ×2 IMPLANT
POUCH SPECIMEN RETRIEVAL 10MM (ENDOMECHANICALS) ×2 IMPLANT
SHEARS HARMONIC ACE PLUS 36CM (ENDOMECHANICALS) ×2 IMPLANT
SPONGE VERSALON 2X2 STRL (MISCELLANEOUS) ×2
STRIP CLOSURE SKIN 1/4X4 (GAUZE/BANDAGES/DRESSINGS) ×2 IMPLANT
SUT VIC AB 0 CT1 36 (SUTURE) ×2 IMPLANT
SUT VIC AB 2-0 UR6 27 (SUTURE) ×2 IMPLANT
SUT VIC AB 4-0 SH 27 (SUTURE) ×2
SUT VIC AB 4-0 SH 27XANBCTRL (SUTURE) ×1 IMPLANT
SWABSTK COMLB BENZOIN TINCTURE (MISCELLANEOUS) ×2 IMPLANT
TROCAR ENDO BLADELESS 11MM (ENDOMECHANICALS) ×2 IMPLANT
TROCAR XCEL NON-BLD 5MMX100MML (ENDOMECHANICALS) ×2 IMPLANT
TROCAR XCEL UNIV SLVE 11M 100M (ENDOMECHANICALS) ×2 IMPLANT
TUBING INSUFFLATOR HI FLOW (MISCELLANEOUS) ×2 IMPLANT

## 2017-07-20 NOTE — Anesthesia Preprocedure Evaluation (Signed)
Anesthesia Evaluation  Patient identified by MRN, date of birth, ID band Patient awake    Reviewed: Allergy & Precautions, NPO status , Patient's Chart, lab work & pertinent test results  History of Anesthesia Complications (+) Family history of anesthesia reactionNegative for: history of anesthetic complications (problems with mom getting to sleep)  Airway Mallampati: II  TM Distance: >3 FB     Dental  (+) Teeth Intact   Pulmonary neg pulmonary ROS, former smoker,    Pulmonary exam normal        Cardiovascular negative cardio ROS Normal cardiovascular exam     Neuro/Psych  Neuromuscular disease negative neurological ROS  negative psych ROS   GI/Hepatic Neg liver ROS, GERD  Medicated and Controlled,  Endo/Other  negative endocrine ROS  Renal/GU Renal disease (stones)  Female GU complaint     Musculoskeletal  (+) Arthritis , Osteoarthritis,    Abdominal Normal abdominal exam  (+)   Peds negative pediatric ROS (+)  Hematology negative hematology ROS (+)   Anesthesia Other Findings Past Medical History: No date: Arthritis     Comment:  low back and neck No date: Back pain of lumbar region with sciatica No date: Chronic kidney disease     Comment:  kidney stones and infection No date: Degenerative disc disease, cervical No date: Family history of adverse reaction to anesthesia     Comment:  "unable to put mother to sleep" No date: GERD (gastroesophageal reflux disease) No date: Lichen No date: Menorrhagia No date: Ovarian cyst  Reproductive/Obstetrics                             Anesthesia Physical  Anesthesia Plan  ASA: II  Anesthesia Plan: General   Post-op Pain Management:    Induction: Intravenous  PONV Risk Score and Plan:   Airway Management Planned: Oral ETT  Additional Equipment:   Intra-op Plan:   Post-operative Plan: Extubation in OR  Informed Consent: I  have reviewed the patients History and Physical, chart, labs and discussed the procedure including the risks, benefits and alternatives for the proposed anesthesia with the patient or authorized representative who has indicated his/her understanding and acceptance.     Plan Discussed with: CRNA and Surgeon  Anesthesia Plan Comments:         Anesthesia Quick Evaluation

## 2017-07-20 NOTE — Anesthesia Postprocedure Evaluation (Signed)
Anesthesia Post Note  Patient: Christine Simmons  Procedure(s) Performed: Procedure(s) (LRB): LAPAROSCOPIC OOPHORECTOMY (Right)  Patient location during evaluation: PACU Anesthesia Type: General Level of consciousness: awake and alert and oriented Pain management: pain level controlled Vital Signs Assessment: post-procedure vital signs reviewed and stable Respiratory status: spontaneous breathing, nonlabored ventilation and respiratory function stable Cardiovascular status: blood pressure returned to baseline and stable Postop Assessment: no signs of nausea or vomiting Anesthetic complications: no     Last Vitals:  Vitals:   07/20/17 1540 07/20/17 1559  BP: (!) 143/104 (!) 138/96  Pulse: 77 74  Resp: 14 (!) 21  Temp:    SpO2: 100% 100%    Last Pain:  Vitals:   07/20/17 1540  TempSrc:   PainSc: 0-No pain                 Bradon Fester

## 2017-07-20 NOTE — Transfer of Care (Signed)
Immediate Anesthesia Transfer of Care Note  Patient: Christine Simmons  Procedure(s) Performed: Procedure(s): LAPAROSCOPIC OOPHORECTOMY (Right)  Patient Location: PACU  Anesthesia Type:General  Level of Consciousness: sedated  Airway & Oxygen Therapy: Patient Spontanous Breathing and Patient connected to face mask oxygen  Post-op Assessment: Report given to RN and Post -op Vital signs reviewed and stable  Post vital signs: Reviewed  Last Vitals:  Vitals:   07/20/17 1106 07/20/17 1526  BP: 116/74 133/83  Pulse: 67 70  Resp: 17 (!) 9  Temp: 36.7 C (!) 36.2 C  SpO2: 99% 100%    Last Pain:  Vitals:   07/20/17 1106  TempSrc: Oral  PainSc: 2          Complications: No apparent anesthesia complications

## 2017-07-20 NOTE — Progress Notes (Signed)
No nausea and states pain is somewhat better

## 2017-07-20 NOTE — Anesthesia Post-op Follow-up Note (Signed)
Anesthesia QCDR form completed.        

## 2017-07-20 NOTE — Anesthesia Procedure Notes (Signed)
Procedure Name: Intubation Date/Time: 07/20/2017 1:56 PM Performed by: Jannet Mantis Pre-anesthesia Checklist: Patient identified, Emergency Drugs available, Suction available and Patient being monitored Patient Re-evaluated:Patient Re-evaluated prior to induction Oxygen Delivery Method: Circle system utilized Preoxygenation: Pre-oxygenation with 100% oxygen Induction Type: IV induction Ventilation: Mask ventilation without difficulty Grade View: Grade III Tube type: Oral Tube size: 7.0 mm Number of attempts: 2 Airway Equipment and Method: Stylet and Video-laryngoscopy Placement Confirmation: ETT inserted through vocal cords under direct vision,  positive ETCO2 and breath sounds checked- equal and bilateral Secured at: 21 cm Tube secured with: Tape Dental Injury: Teeth and Oropharynx as per pre-operative assessment  Comments: First attempt traditional DL with grade 3 view- second attempt grade one McGrath

## 2017-07-20 NOTE — Brief Op Note (Signed)
07/20/2017  3:13 PM  PATIENT:  Christine Simmons  41 y.o. female  PRE-OPERATIVE DIAGNOSIS:  Chronic Pelvic Pain ; Endometriosis  POST-OPERATIVE DIAGNOSIS:  Chronic Pelvic Pain ; Endometriosis  PROCEDURE:  Procedure(s): LAPAROSCOPIC OOPHORECTOMY (Right) Excision of endometriosis SURGEON:  Surgeon(s) and Role:    * Venisha Boehning, Ihor Austin, MD - Primary  PHYSICIAN ASSISTANT: scrub tech   ASSISTANTS: none   ANESTHESIA:   general  EBL:  Total I/O In: 800 [I.V.:800] Out: 110 [Urine:100; Blood:10]  BLOOD ADMINISTERED:none  DRAINS: none   LOCAL MEDICATIONS USED:  MARCAINE     SPECIMEN:  Source of Specimen:  right ovary , peritoneal excision from cul- de-sac    DISPOSITION OF SPECIMEN:  PATHOLOGY  COUNTS:  YES  TOURNIQUET:  * No tourniquets in log *  DICTATION: .Other Dictation: Dictation Number verbal  PLAN OF CARE: Discharge to home after PACU  PATIENT DISPOSITION:  PACU - hemodynamically stable.   Delay start of Pharmacological VTE agent (>24hrs) due to surgical blood loss or risk of bleeding: not applicable

## 2017-07-21 NOTE — Op Note (Signed)
NAME:  Christine Simmons, COLLEDGE NO.:  [id number]  MEDICAL RECORD NO.:  [id number]  LOCATION:                                 FACILITY:  PHYSICIAN:  Jennell Corner, MD     DATE OF BIRTH:  DATE OF PROCEDURE:  07/20/2017 DATE OF DISCHARGE:                              OPERATIVE REPORT   PREOPERATIVE DIAGNOSIS: 1. Chronic pelvic pain. 2. Endometriosis.  POSTOPERATIVE DIAGNOSIS: 1. Chronic pelvic pain. 2. Allen-Masters window consistent with endometriosis.  PROCEDURE PERFORMED: 1. Laparoscopic right oophorectomy. 2. Excision of endometriosis.  SURGEON:  Jennell Corner, MD  ANESTHESIA:  General endotracheal anesthesia.  INDICATION:  A 41 year old, gravida 2, para 2 patient is status post LAVH and bilateral salpingectomy with pathology consistent with endosalpingiosis pain.  The patient has had a longstanding pain, right greater than left, and has failed Seasonale and Lupron treatment.  DESCRIPTION OF PROCEDURE:  After adequate general endotracheal anesthesia, the patient was placed in dorsal supine position, legs in the Brashear stirrups.  The patient's abdomen, perineum, and vagina were prepped and draped in normal sterile fashion.  Time-out was performed. Straight catheterization of the bladder yielded 100 mL clear urine. Sponge stick was placed in the vagina to be used for manipulation during the procedure.  Gloves were changed.  Attention was directed to the umbilicus, where a 15 mm infraumbilical incision was made after injecting with 0.5% Marcaine.  The 10 mm scope was advanced into the abdominal cavity under direct visualization with the Optiview cannula. The patient's abdomen was insufflated.  Second port site was placed left lower quadrant, 3 cm medial to the left anterior iliac spine.  A 10 mm port was advanced under direct visualization.  Third port was placed right lower quadrant again 3 cm medial to the right anterior iliac spine and a 5 mm port  was advanced into the abdominal cavity under direct visualization.  Initial impression of the pelvis, ovaries appeared normal, slight adhesion from the right ovary to the posterior cul-de- sac, slight colonic adhesions to the left sidewall, there was a 2 x 2 cm Allen-Masters window in the anterior cul-de-sac.  Ureteral function appeared normal.  Given the patient's multiple surgeries and continue chronic pain and based on her pain being on the right, we had elected to proceed with a right oophorectomy.  The patient was well aware of the surgery that was proposed.  The infundibulopelvic ligament was cauterized and the right ovary was removed without difficulty.  Good hemostasis was noted.  The ovary was placed in endobag and removed through the left lower port site.  Attention was then directed through the Allen-Masters window in the anterior cul-de-sac.  The central portion of the window was elevated with atraumatic graspers and harmonic scalpel was used to dissect the peritoneum out.  This will be sent as a separate specimen.  Good hemostasis was noted.  The mild colonic adhesions to the left sidewall were taken down with harmonic scalpel. Upper abdomen appeared normal.  No other evidence of etiology for chronic pelvic pain was noted.  The patient's abdomen was deflated and the left lower port site and the infraumbilical port site were closed with 2-layer closure with  the fascial layer being closed with 2-0 Vicryl suture and all 3 skin incisions were closed with 4-0 Vicryl interrupted sutures.  The 2 x 2 and Tegaderm placed over the incisions.  Sponge- Stick was removed.  There were no complications.  ESTIMATED BLOOD LOSS:  10 mL.  INTRAOPERATIVE FLUIDS:  800 mL.  URINE OUTPUT:  100 mL.  The patient was taken to recovery room in good condition.    ______________________________ Jennell Corner, MD   ______________________________ Jennell Corner, MD    TS/MEDQ  D:   07/20/2017  T:  07/20/2017  Job:  [id number]

## 2017-07-22 LAB — SURGICAL PATHOLOGY

## 2017-07-23 ENCOUNTER — Emergency Department
Admission: EM | Admit: 2017-07-23 | Discharge: 2017-07-23 | Disposition: A | Discharge status: Home / Self Care | Payer: BLUE CROSS/BLUE SHIELD | Attending: Student in an Organized Health Care Education/Training Program | Admitting: Student in an Organized Health Care Education/Training Program

## 2017-07-23 ENCOUNTER — Emergency Department: Payer: BLUE CROSS/BLUE SHIELD

## 2017-07-23 ENCOUNTER — Encounter: Payer: Self-pay | Admitting: Emergency Medicine

## 2017-07-23 DIAGNOSIS — R0602 Shortness of breath: Secondary | ICD-10-CM | POA: Diagnosis not present

## 2017-07-23 DIAGNOSIS — Z87891 Personal history of nicotine dependence: Secondary | ICD-10-CM | POA: Diagnosis not present

## 2017-07-23 DIAGNOSIS — N189 Chronic kidney disease, unspecified: Secondary | ICD-10-CM | POA: Insufficient documentation

## 2017-07-23 DIAGNOSIS — R109 Unspecified abdominal pain: Secondary | ICD-10-CM | POA: Diagnosis not present

## 2017-07-23 DIAGNOSIS — R1032 Left lower quadrant pain: Secondary | ICD-10-CM | POA: Diagnosis not present

## 2017-07-23 DIAGNOSIS — T819XXA Unspecified complication of procedure, initial encounter: Secondary | ICD-10-CM | POA: Diagnosis not present

## 2017-07-23 DIAGNOSIS — Y69 Unspecified misadventure during surgical and medical care: Secondary | ICD-10-CM | POA: Diagnosis not present

## 2017-07-23 LAB — URINALYSIS, COMPLETE (UACMP) WITH MICROSCOPIC
BACTERIA UA: NONE SEEN
Bilirubin Urine: NEGATIVE
Glucose, UA: NEGATIVE mg/dL
HGB URINE DIPSTICK: NEGATIVE
Ketones, ur: NEGATIVE mg/dL
Leukocytes, UA: NEGATIVE
NITRITE: NEGATIVE
Protein, ur: NEGATIVE mg/dL
RBC / HPF: NONE SEEN RBC/hpf (ref 0–5)
SPECIFIC GRAVITY, URINE: 1.011 (ref 1.005–1.030)
pH: 6 (ref 5.0–8.0)

## 2017-07-23 LAB — CBC
HCT: 37.9 % (ref 35.0–47.0)
Hemoglobin: 13.3 g/dL (ref 12.0–16.0)
MCH: 30.1 pg (ref 26.0–34.0)
MCHC: 35 g/dL (ref 32.0–36.0)
MCV: 86 fL (ref 80.0–100.0)
PLATELETS: 237 10*3/uL (ref 150–440)
RBC: 4.41 MIL/uL (ref 3.80–5.20)
RDW: 12.7 % (ref 11.5–14.5)
WBC: 6.5 10*3/uL (ref 3.6–11.0)

## 2017-07-23 LAB — BASIC METABOLIC PANEL
ANION GAP: 9 (ref 5–15)
BUN: 19 mg/dL (ref 6–20)
CALCIUM: 9.5 mg/dL (ref 8.9–10.3)
CO2: 28 mmol/L (ref 22–32)
CREATININE: 0.83 mg/dL (ref 0.44–1.00)
Chloride: 104 mmol/L (ref 101–111)
GLUCOSE: 126 mg/dL — AB (ref 65–99)
Potassium: 3.8 mmol/L (ref 3.5–5.1)
Sodium: 141 mmol/L (ref 135–145)

## 2017-07-23 LAB — TROPONIN I: Troponin I: <0.03 ng/mL (ref <0.03)

## 2017-07-23 IMAGING — CT CT ABD-PELV W/ CM
4 of 11 series · 12 of 46 positions shown, 18 images · IV contrast (APPLIED)
Comparison: CT abdomen/pelvis [DATE] and [DATE]

CLINICAL DATA: Difficulty taking deep breath. Right ovary removed 4
days ago. Pain over left abdominal incision site.

EXAM:
CT ANGIOGRAPHY CHEST
CT ABDOMEN AND PELVIS WITH CONTRAST
TECHNIQUE: Multidetector CT imaging of the chest was performed using the
standard protocol during bolus administration of intravenous
contrast. Multiplanar CT image reconstructions and MIPs were
obtained to evaluate the vascular anatomy. Multidetector CT imaging
of the abdomen and pelvis was performed using the standard protocol
during bolus administration of intravenous contrast.
CONTRAST:  100 mL Isovue 370 IV.

[Series 5: thins · axial · 0.71mm/px · z∈[-704,-630]mm · 3 of 257 slices shown]
[im 19/257  soft-tissue]
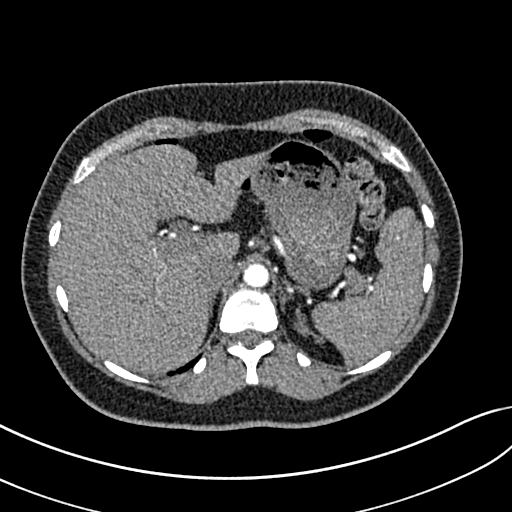
[im 55/257  soft-tissue]
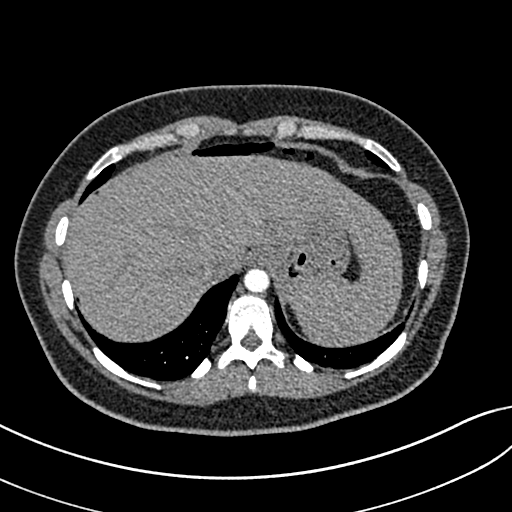
[im 92/257  soft-tissue]
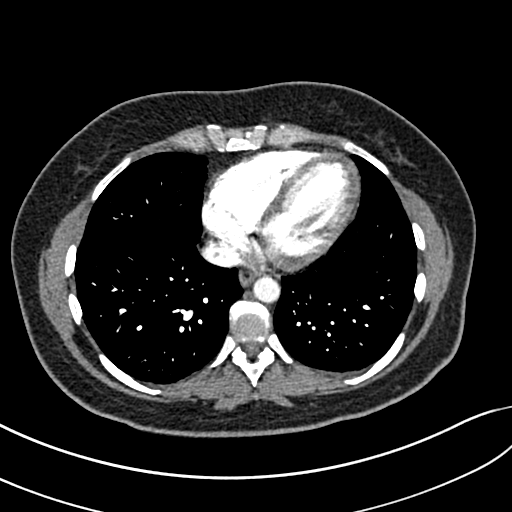

[Series 7: coronal mpr · coronal · 0.53mm/px · 2 of 122 slices shown, 3 images]
[im 41/122  soft-tissue]
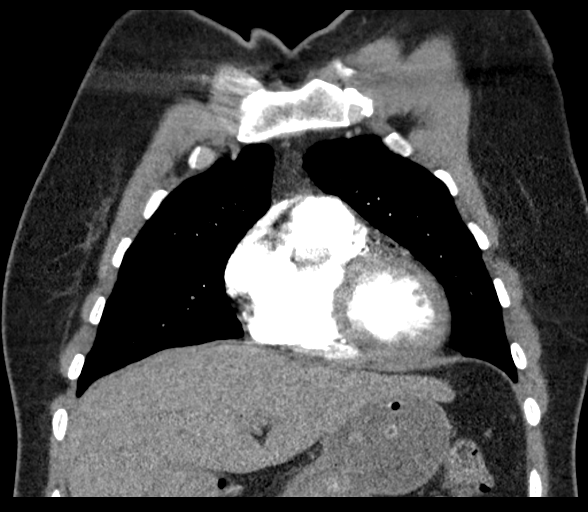
[im 41/122  bone]
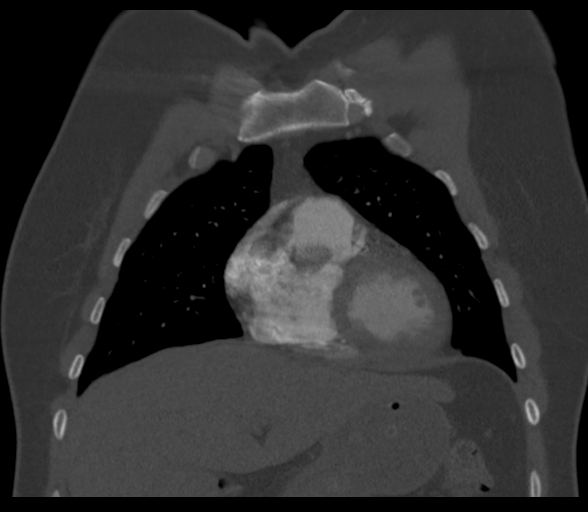
[im 81/122  soft-tissue]
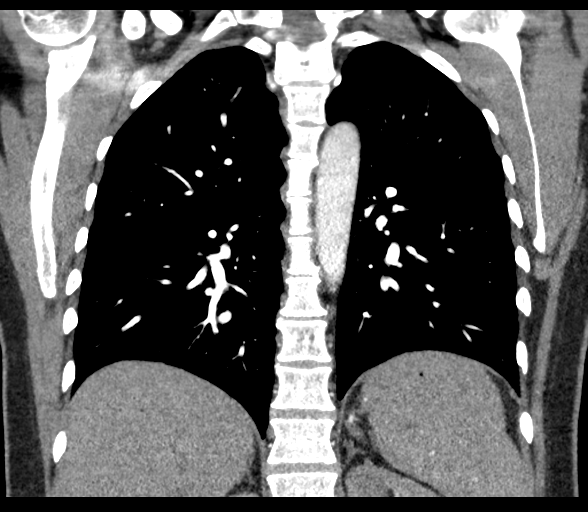

[Series 11: axial st · axial · 0.74mm/px · z∈[-993,-708]mm · 4 of 95 slices shown, 9 images]
[im 19/95  soft-tissue]
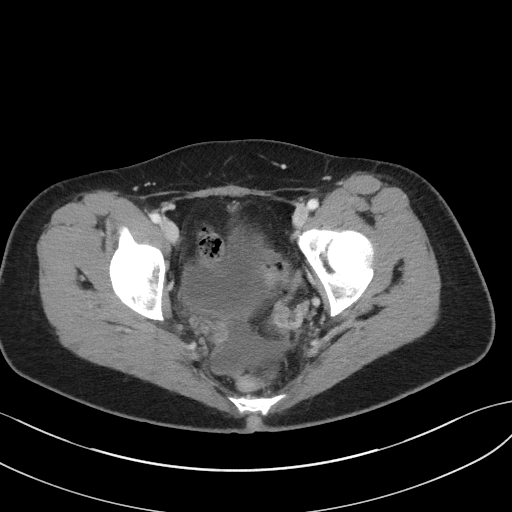
[im 19/95  lung]
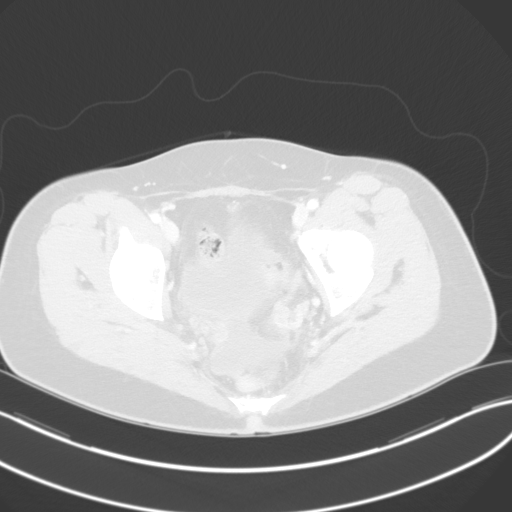
[im 19/95  bone]
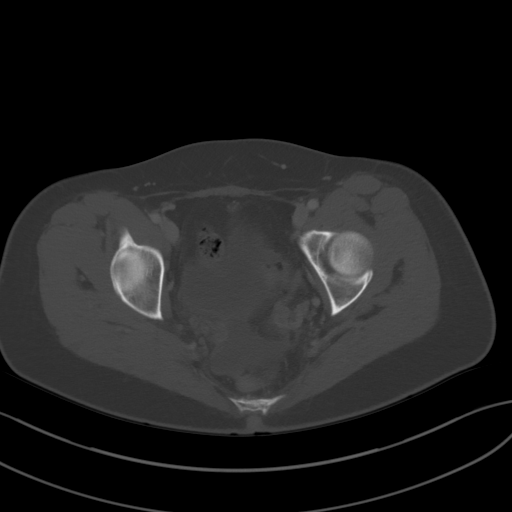
[im 38/95  soft-tissue]
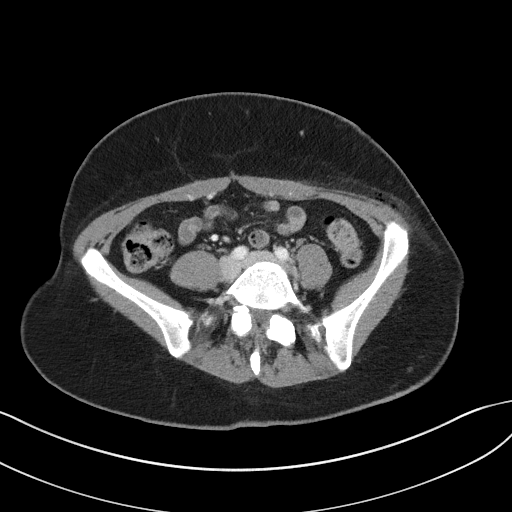
[im 38/95  lung]
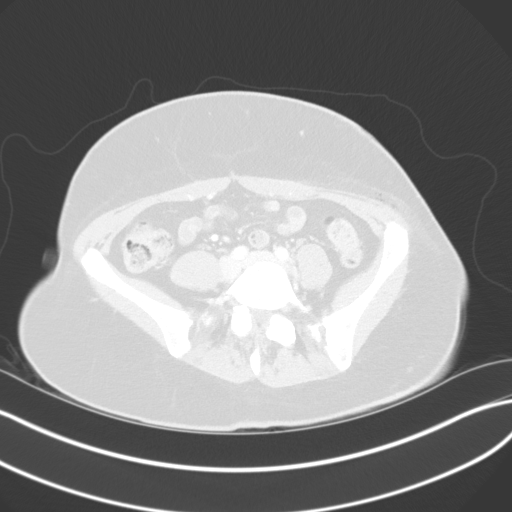
[im 57/95  soft-tissue]
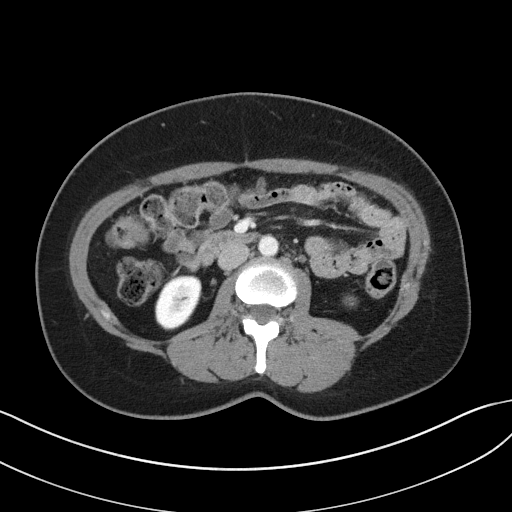
[im 57/95  lung]
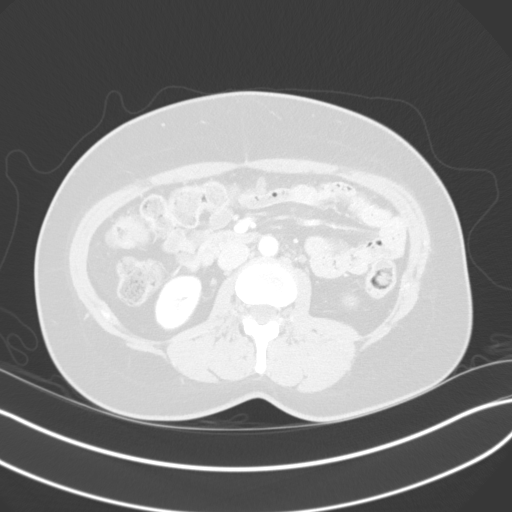
[im 76/95  soft-tissue]
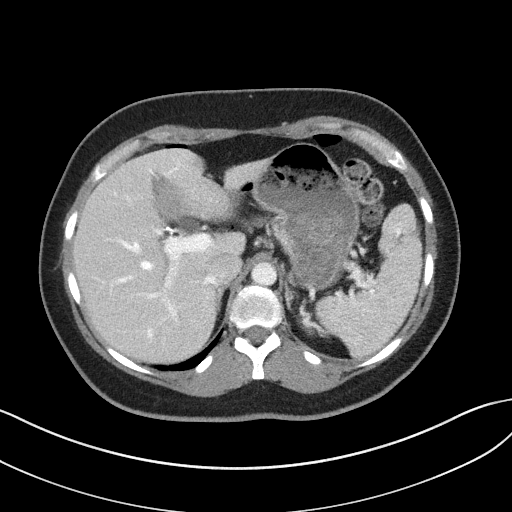
[im 76/95  lung]
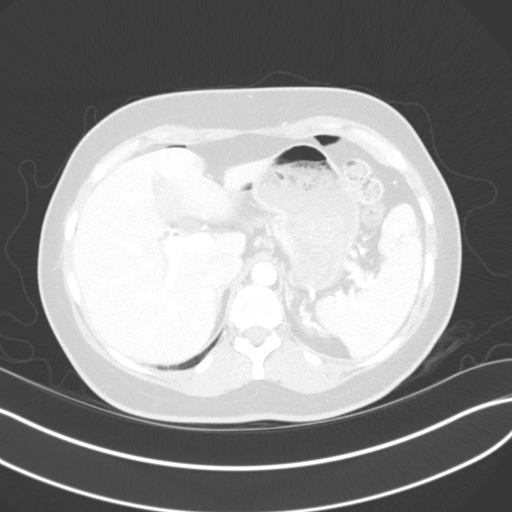

[Series 16: delays · axial · 0.68mm/px · z∈[-984,-789]mm · 3 of 79 slices shown]
[im 20/79  soft-tissue]
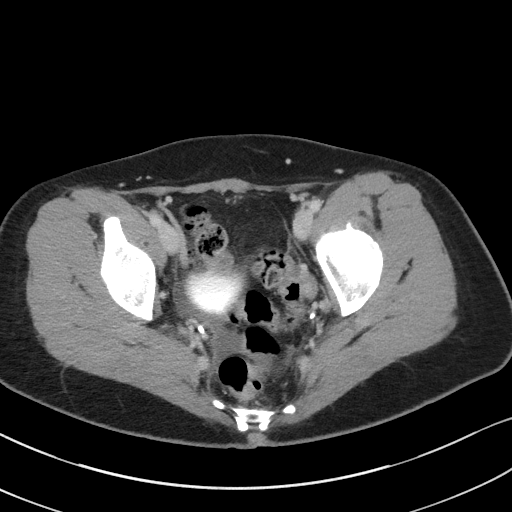
[im 40/79  soft-tissue]
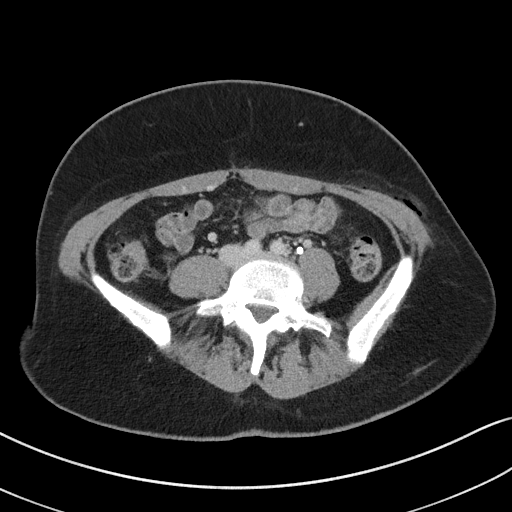
[im 59/79  soft-tissue]
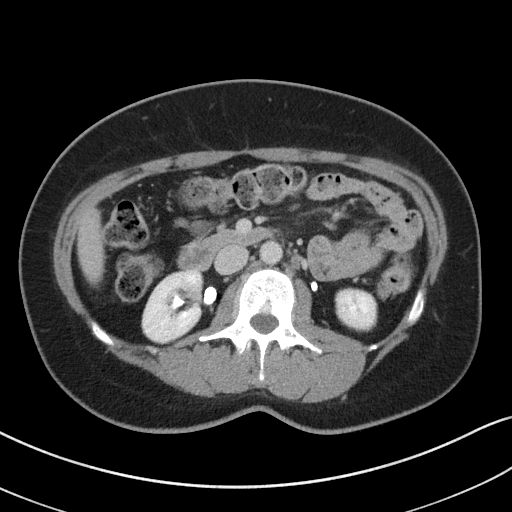

[12 of 46 positions shown; findings below may reference images not displayed]

FINDINGS: CTA CHEST FINDINGS

Cardiovascular: Heart is normal in size. No evidence of pulmonary
embolism. Remaining vascular structures are within normal.

Mediastinum/Nodes: No mediastinal or hilar adenopathy. Remaining
mediastinal structures are normal.

Lungs/Pleura: Lungs are well inflated without consolidation or
effusion. Airways are normal.

Musculoskeletal: Within normal.

Review of the MIP images confirms the above findings.

CT ABDOMEN and PELVIS FINDINGS

Hepatobiliary: Previous cholecystectomy. Possible 1 cm hypodensity
over the dome of the liver too small to characterize but likely a
cyst. Intrahepatic biliary tree is within normal. Mild post
cholecystectomy prominence of the common bile duct.

Pancreas: Within normal.

Spleen: Within normal.

Adrenals/Urinary Tract: Adrenal glands are normal. Kidneys are
normal in size without hydronephrosis or nephrolithiasis. Ureters
and bladder are normal.

Stomach/Bowel: Stomach and small bowel are normal. Appendix is
normal. Colon is within normal.

Vascular/Lymphatic: Within normal.

Reproductive: Previous hysterectomy.

Other: Mild amount of free peritoneal air from patient's recent
laparoscopic right oophorectomy. Mild amount of free fluid in the
pelvis likely postsurgical. Small amount of air within the
subcutaneous tissues of the lower left anterior abdominal wall
likely related patient's recent surgery.

Musculoskeletal: Within normal.

Review of the MIP images confirms the above findings.
IMPRESSION: No evidence of pulmonary embolism. No acute cardiopulmonary disease.

Mild amount of free peritoneal air from patient's recent right
oophorectomy. Mild free pelvic fluid likely postsurgical. Otherwise
otherwise, no acute findings in the abdomen/pelvis.

1 cm hypodensity over the dome of the liver too small to
characterize but likely a cyst.

## 2017-07-23 MED ORDER — TRAMADOL HCL 50 MG PO TABS: 50.0000 mg | ORAL_TABLET | Freq: Four times a day (QID) | ORAL | 0 refills | Status: AC | PRN

## 2017-07-23 MED ORDER — IOPAMIDOL (ISOVUE-370) INJECTION 76%
100.0000 mL | Freq: Once | INTRAVENOUS | Status: AC | PRN
  Administered 2017-07-23: 100 mL via INTRAVENOUS
  Filled 2017-07-23: qty 100

## 2017-07-23 MED ORDER — MORPHINE SULFATE (PF) 4 MG/ML IV SOLN: 4.0000 mg | INTRAVENOUS | Status: DC | PRN

## 2017-07-23 MED ORDER — ONDANSETRON HCL 4 MG/2ML IJ SOLN
4.0000 mg | Freq: Once | INTRAMUSCULAR | Status: DC
Start: 2017-07-23 — End: 2017-07-24

## 2017-07-23 NOTE — ED Notes (Signed)
Patient transported to CT 

## 2017-07-23 NOTE — Discharge Instructions (Signed)

## 2017-07-23 NOTE — ED Triage Notes (Signed)
Pt reports had right ovary removed on Monday via laparoscope. Pt reports pain on the left side incision has not let up at all since surgery. Pt reports yesterday and today she has had a hard time taking a deep breath and talking wears her out. Pt respirations even and nonlabored. Room air SpO2 100%. Pt reports today is the first time she has been able to eat since surgery.

## 2017-07-23 NOTE — ED Notes (Signed)
Pt reports this Monday having laparoscopic right ovary removal and states pain on pt's LLQ has not eased up even after taking her prescribed pain medicine. Pt has 3 sites to abd from surgery and denies pain to right lower and central site. Pt states the pain is sharp and states pain increases with movement. Pt also states soreness with palpation to left surgical site. Pt states the physician told her not to remove bandages until Saturday. Pt denies fever.

## 2017-07-23 NOTE — ED Provider Notes (Signed)
[facility] Emergency Department Provider Note    First MD Initiated Contact with Patient 07/23/17 1933     (approximate)  I have reviewed the triage vital signs and the nursing notes.   HISTORY  Chief Complaint Post op problem    HPI Christine Simmons is a 41 y.o. female his is a chief complaint of left lower quadrant pain as well as pleuritic chest pain and shortness of breath after having laparoscopic nephrectomy done this Monday. Denies any fevers. States she is having some constipation. No dysuria or vaginal bleeding. States the pain is localized to the incision site and left lower quadrant. Is not having any nausea or vomiting. Has never had chest pain like this before. No cough. No exertional chest pain.   Past Medical History:  Diagnosis Date  . Arthritis    low back and neck  . Back pain of lumbar region with sciatica   . Chronic kidney disease    kidney stones and infection  . Degenerative disc disease, cervical   . Family history of adverse reaction to anesthesia    "unable to put mother to sleep"  . GERD (gastroesophageal reflux disease)   . Lichen   . Menorrhagia   . Ovarian cyst    Family History  Problem Relation Age of Onset  . Hypertension Mother   . Gallbladder disease Maternal Grandmother    Past Surgical History:  Procedure Laterality Date  . ANKLE SURGERY Left   . CESAREAN SECTION    . CHOLECYSTECTOMY N/A 04/10/2015   Procedure: LAPAROSCOPIC CHOLECYSTECTOMY WITH INTRAOPERATIVE CHOLANGIOGRAM;  Surgeon: Lattie Haw, MD;  Location: [facility] ORS;  Service: General;  Laterality: N/A;  . ESOPHAGOGASTRODUODENOSCOPY (EGD) WITH PROPOFOL N/A 12/12/2016   Procedure: ESOPHAGOGASTRODUODENOSCOPY (EGD) WITH PROPOFOL;  Surgeon: Christena Deem, MD;  Location: [facility] ENDOSCOPY;  Service: Endoscopy;  Laterality: N/A;  . LAPAROSCOPIC VAGINAL HYSTERECTOMY WITH SALPINGECTOMY Bilateral 07/07/2016   Procedure: LAPAROSCOPIC ASSISTED VAGINAL  HYSTERECTOMY WITH SALPINGECTOMY;  Surgeon: Suzy Bouchard, MD;  Location: [facility] ORS;  Service: Gynecology;  Laterality: Bilateral;   Patient Active Problem List   Diagnosis Date Noted  . Post-operative state 07/07/2016  . Cholelithiasis 04/10/2015      Prior to Admission medications   Medication Sig Start Date End Date Taking? Authorizing Provider  amoxicillin (AMOXIL) 500 MG capsule Take 1 capsule (500 mg total) by mouth 3 (three) times daily. 07/13/17 07/23/17  Orvil Feil, PA-C  ibuprofen (ADVIL) 200 MG tablet Take 3 tablets (600 mg total) by mouth every 6 (six) hours as needed for mild pain. 07/08/16   Schermerhorn, Ihor Austin, MD  pantoprazole (PROTONIX) 40 MG tablet Take 40 mg by mouth daily as needed.     [provider]  traMADol (ULTRAM) 50 MG tablet Take 1 tablet (50 mg total) by mouth every 6 (six) hours as needed. 07/23/17 07/23/18  Willy Eddy, MD    Allergies Sulfa antibiotics    Social History Social History  Substance Use Topics  . Smoking status: Former Smoker    Quit date: 04/09/1997  . Smokeless tobacco: Never Used  . Alcohol use No    Review of Systems Patient denies headaches, rhinorrhea, blurry vision, numbness, shortness of breath, chest pain, edema, cough, abdominal pain, nausea, vomiting, diarrhea, dysuria, fevers, rashes or hallucinations unless otherwise stated above in HPI. ____________________________________________   PHYSICAL EXAM:  VITAL SIGNS: Vitals:   07/23/17 1847  BP: 115/79  Pulse: 75  Resp: 18  Temp: 98.1 F (  36.7 C)  SpO2: 100%    Constitutional: Alert and oriented. Well appearing and in no acute distress. Eyes: Conjunctivae are normal.  Head: Atraumatic. Nose: No congestion/rhinnorhea. Mouth/Throat: Mucous membranes are moist.   Neck: No stridor. Painless ROM.  Cardiovascular: Normal rate, regular rhythm. Grossly normal heart sounds.  Good peripheral circulation. Respiratory: Normal respiratory effort.   No retractions. Lungs CTAB. Gastrointestinal: Soft with mild llq ttp, small amount of ecchymosis,, no peritonitis No distention. No abdominal bruits. No CVA tenderness. Genitourinary:  Musculoskeletal: No lower extremity tenderness nor edema.  No joint effusions. Neurologic:  Normal speech and language. No gross focal neurologic deficits are appreciated. No facial droop Skin:  Skin is warm, dry and intact. No rash noted. Psychiatric: Mood and affect are normal. Speech and behavior are normal.  ____________________________________________   LABS (all labs ordered are listed, but only abnormal results are displayed)  Results for orders placed or performed during the hospital encounter of 07/23/17 (from the past 24 hour(s))  CBC     Status: None   Collection Time: 07/23/17  7:01 PM  Result Value Ref Range   WBC 6.5 3.6 - 11.0 K/uL   RBC 4.41 3.80 - 5.20 MIL/uL   Hemoglobin 13.3 12.0 - 16.0 g/dL   HCT 16.1 09.6 - 04.5 %   MCV 86.0 80.0 - 100.0 fL   MCH 30.1 26.0 - 34.0 pg   MCHC 35.0 32.0 - 36.0 g/dL   RDW 40.9 81.1 - 91.4 %   Platelets 237 150 - 440 K/uL  Basic metabolic panel     Status: Abnormal   Collection Time: 07/23/17  7:01 PM  Result Value Ref Range   Sodium 141 135 - 145 mmol/L   Potassium 3.8 3.5 - 5.1 mmol/L   Chloride 104 101 - 111 mmol/L   CO2 28 22 - 32 mmol/L   Glucose, Bld 126 (H) 65 - 99 mg/dL   BUN 19 6 - 20 mg/dL   Creatinine, Ser 7.82 0.44 - 1.00 mg/dL   Calcium 9.5 8.9 - 95.6 mg/dL   GFR calc non Af Amer >60 >60 mL/min   GFR calc Af Amer >60 >60 mL/min   Anion gap 9 5 - 15  Troponin I     Status: None   Collection Time: 07/23/17  7:01 PM  Result Value Ref Range   Troponin I <0.03 <0.03 ng/mL  Urinalysis, Complete w Microscopic     Status: Abnormal   Collection Time: 07/23/17  7:01 PM  Result Value Ref Range   Color, Urine STRAW (A) YELLOW   APPearance CLEAR (A) CLEAR   Specific Gravity, Urine 1.011 1.005 - 1.030   pH 6.0 5.0 - 8.0   Glucose, UA  NEGATIVE NEGATIVE mg/dL   Hgb urine dipstick NEGATIVE NEGATIVE   Bilirubin Urine NEGATIVE NEGATIVE   Ketones, ur NEGATIVE NEGATIVE mg/dL   Protein, ur NEGATIVE NEGATIVE mg/dL   Nitrite NEGATIVE NEGATIVE   Leukocytes, UA NEGATIVE NEGATIVE   RBC / HPF NONE SEEN 0 - 5 RBC/hpf   WBC, UA 0-5 0 - 5 WBC/hpf   Bacteria, UA NONE SEEN NONE SEEN   Squamous Epithelial / LPF 0-5 (A) NONE SEEN   Mucus PRESENT    ____________________________________________  EKG My review and personal interpretation at Time: 18:58   Indication: chest pain  Rate: 70  Rhythm: sinus Axis: normal  Other: no stemi, normal intervals,  ____________________________________________  RADIOLOGY  I personally reviewed all radiographic images ordered to evaluate for the above  acute complaints and reviewed radiology reports and findings.  These findings were personally discussed with the patient.  Please see medical record for radiology report.  ____________________________________________   PROCEDURES  Procedure(s) performed:  Procedures    Critical Care performed: no ____________________________________________   INITIAL IMPRESSION / ASSESSMENT AND PLAN / ED COURSE  Pertinent labs & imaging results that were available during my care of the patient were reviewed by me and considered in my medical decision making (see chart for details).  DDX: bronchitis, pe, acs, atelectasis, pna, constipation, uti, abscess, hematoma  CARTHA ROTERT is a 41 y.o. who presents to the ED with shortness of breath and left lower quadrant pain as described above. She is well-appearing and in no acute distress. Based on her high-risk postop state CT imaging ordered for the above differential shows no evidence of pulmonary embolism or pneumonia. She is in no respiratory distress. Is not clinically consistent with ACS. No evidence of urinary tract infection. Patient does have some air tracking along the left lower anterior abdominal wall  which would correspond with her pain. This would be expected in a recent postoperative state. Repeat abdominal exam is soft and benign. I do believe that she is stable and appropriate for a period Of observation as an outpatient with f/u with ob/gyn.      ____________________________________________   FINAL CLINICAL IMPRESSION(S) / ED DIAGNOSES  Final diagnoses:  Shortness of breath  Left lower quadrant pain      NEW MEDICATIONS STARTED DURING THIS VISIT:  New Prescriptions   TRAMADOL (ULTRAM) 50 MG TABLET    Take 1 tablet (50 mg total) by mouth every 6 (six) hours as needed.     Note:  This document was prepared using Dragon voice recognition software and may include unintentional dictation errors.    Willy Eddy, MD 07/23/17 2122

## 2017-08-10 DIAGNOSIS — G589 Mononeuropathy, unspecified: Secondary | ICD-10-CM | POA: Diagnosis not present

## 2017-08-10 DIAGNOSIS — N803 Endometriosis of pelvic peritoneum: Secondary | ICD-10-CM | POA: Diagnosis not present

## 2017-09-10 ENCOUNTER — Other Ambulatory Visit: Payer: Self-pay

## 2017-09-10 ENCOUNTER — Emergency Department
Admission: EM | Admit: 2017-09-10 | Discharge: 2017-09-10 | Disposition: A | Discharge status: Home / Self Care | Payer: BLUE CROSS/BLUE SHIELD | Attending: Emergency Medicine | Admitting: Emergency Medicine

## 2017-09-10 DIAGNOSIS — Z87891 Personal history of nicotine dependence: Secondary | ICD-10-CM | POA: Insufficient documentation

## 2017-09-10 DIAGNOSIS — R58 Hemorrhage, not elsewhere classified: Secondary | ICD-10-CM

## 2017-09-10 DIAGNOSIS — N189 Chronic kidney disease, unspecified: Secondary | ICD-10-CM | POA: Insufficient documentation

## 2017-09-10 DIAGNOSIS — K5900 Constipation, unspecified: Secondary | ICD-10-CM | POA: Diagnosis not present

## 2017-09-10 DIAGNOSIS — K649 Unspecified hemorrhoids: Secondary | ICD-10-CM | POA: Insufficient documentation

## 2017-09-10 MED ORDER — HYDROCORTISONE ACETATE 25 MG RE SUPP: 25.0000 mg | Freq: Two times a day (BID) | RECTAL | 1 refills | Status: DC

## 2017-09-10 MED ORDER — DOCUSATE SODIUM 100 MG PO CAPS: 100.0000 mg | ORAL_CAPSULE | Freq: Every day | ORAL | 2 refills | Status: DC | PRN

## 2017-09-10 NOTE — ED Notes (Signed)
Pt has problems with constipation, for the last three weeks she has had hemorrhoids. She has taken OTC to try to elevate it with no success. Pt has one protruding hemorrhoid.  States that she has been going to work but it gets worse as the day goes on.

## 2017-09-10 NOTE — ED Provider Notes (Signed)
[facility] Emergency Department Provider Note   ____________________________________________   None    (approximate)  I have reviewed the triage vital signs and the nursing notes.   HISTORY  Chief Complaint Hemorrhoids    HPI Christine Simmons is a 41 y.o. female patient complaining that 3 weeks. Patient stated no relief over-the-counter creams. Patient also complaining of constipation. Patient will require standing for approximately 9 hours a day. Patient denies rectal bleeding.Patient rates the pain as a 6/10. Patient describes pain as "achy".   Past Medical History:  Diagnosis Date  . Arthritis    low back and neck  . Back pain of lumbar region with sciatica   . Chronic kidney disease    kidney stones and infection  . Degenerative disc disease, cervical   . Family history of adverse reaction to anesthesia    "unable to put mother to sleep"  . GERD (gastroesophageal reflux disease)   . Lichen   . Menorrhagia   . Ovarian cyst     Patient Active Problem List   Diagnosis Date Noted  . Post-operative state 07/07/2016  . Cholelithiasis 04/10/2015    Past Surgical History:  Procedure Laterality Date  . ANKLE SURGERY Left   . CESAREAN SECTION    . CHOLECYSTECTOMY N/A 04/10/2015   Procedure: LAPAROSCOPIC CHOLECYSTECTOMY WITH INTRAOPERATIVE CHOLANGIOGRAM;  Surgeon: Lattie Hawichard E Cooper, MD;  Location: [facility] ORS;  Service: General;  Laterality: N/A;  . ESOPHAGOGASTRODUODENOSCOPY (EGD) WITH PROPOFOL N/A 12/12/2016   Procedure: ESOPHAGOGASTRODUODENOSCOPY (EGD) WITH PROPOFOL;  Surgeon: Christena DeemMartin U Skulskie, MD;  [facility];  Service: Endoscopy;  Laterality: N/A;  . LAPAROSCOPIC VAGINAL HYSTERECTOMY WITH SALPINGECTOMY Bilateral 07/07/2016   Procedure: LAPAROSCOPIC ASSISTED VAGINAL HYSTERECTOMY WITH SALPINGECTOMY;  Surgeon: Suzy Bouchardhomas J Schermerhorn, MD;  Location: [facility] ORS;  Service: Gynecology;  Laterality: Bilateral;    Prior to Admission  medications   Medication Sig Start Date End Date Taking? Authorizing Provider  docusate sodium (COLACE) 100 MG capsule Take 1 capsule (100 mg total) daily as needed by mouth. 09/10/17 09/10/18  Joni ReiningSmith, Ronald K, PA-C  hydrocortisone (ANUSOL-HC) 25 MG suppository Place 1 suppository (25 mg total) every 12 (twelve) hours rectally. 09/10/17 09/10/18  Joni ReiningSmith, Ronald K, PA-C  ibuprofen (ADVIL) 200 MG tablet Take 3 tablets (600 mg total) by mouth every 6 (six) hours as needed for mild pain. 07/08/16   Schermerhorn, Ihor Austinhomas J, MD  pantoprazole (PROTONIX) 40 MG tablet Take 40 mg by mouth daily as needed.     [provider]  traMADol (ULTRAM) 50 MG tablet Take 1 tablet (50 mg total) by mouth every 6 (six) hours as needed. 07/23/17 07/23/18  Willy Eddyobinson, Patrick, MD    Allergies Sulfa antibiotics  Family History  Problem Relation Age of Onset  . Hypertension Mother   . Gallbladder disease Maternal Grandmother     Social History Social History   Tobacco Use  . Smoking status: Former Smoker    Last attempt to quit: 04/09/1997    Years since quitting: 20.4  . Smokeless tobacco: Never Used  Substance Use Topics  . Alcohol use: No  . Drug use: No    Review of Systems Constitutional: No fever/chills Eyes: No visual changes. ENT: No sore throat. Cardiovascular: Denies chest pain. Respiratory: Denies shortness of breath. Gastrointestinal: No abdominal pain.  No nausea, no vomiting.  No diarrhea. Moderate constipation. Genitourinary: Negative for dysuria. Musculoskeletal: Negative for back pain. Skin: Negative for rash. Neurological: Negative for headaches, focal weakness or numbness. Allergic/Immunilogical: Sulfa  antibiotics  ____________________________________________   PHYSICAL EXAM:  VITAL SIGNS: ED Triage Vitals  Enc Vitals Group     BP 09/10/17 0729 117/77     Pulse Rate 09/10/17 0729 65     Resp 09/10/17 0729 16     Temp 09/10/17 0729 97.7 F (36.5 C)     Temp Source  09/10/17 0729 Oral     SpO2 09/10/17 0729 100 %     Weight 09/10/17 0730 140 lb (63.5 kg)     Height 09/10/17 0730 5\' 4"  (1.626 m)     Head Circumference --      Peak Flow --      Pain Score 09/10/17 0729 6     Pain Loc --      Pain Edu? --      Excl. in GC? --    Constitutional: Alert and oriented. Well appearing and in no acute distress. Cardiovascular: Normal rate, regular rhythm. Grossly normal heart sounds.  Good peripheral circulation. Respiratory: Normal respiratory effort.  No retractions. Lungs CTAB. Gastrointestinal: Soft and nontender. No distention. No abdominal bruits. No CVA tenderness. Small non-thrombosed hemorrhoidal tissue Musculoskeletal: No lower extremity tenderness nor edema.  No joint effusions. Neurologic:  Normal speech and language. No gross focal neurologic deficits are appreciated. No gait instability. Skin:  Skin is warm, dry and intact. No rash noted. Psychiatric: Mood and affect are normal. Speech and behavior are normal.  ____________________________________________   LABS (all labs ordered are listed, but only abnormal results are displayed)  Labs Reviewed - No data to display ____________________________________________  EKG   ____________________________________________  RADIOLOGY  No results found.  ____________________________________________   PROCEDURES  Procedure(s) performed: None  Procedures  Critical Care performed: No  ____________________________________________   INITIAL IMPRESSION / ASSESSMENT AND PLAN / ED COURSE  As part of my medical decision making, I reviewed the following data within the electronic MEDICAL RECORD NUMBER    Rectal pain secondary to hemorrhoid. Patient given discharge care instructions. Patient advised to use suppositories and stool softener as directed. Advised to follow-up with family clinic if no improvement in one week.      ____________________________________________   FINAL CLINICAL  IMPRESSION(S) / ED DIAGNOSES  Final diagnoses:  Hemorrhage     ED Discharge Orders        Ordered    docusate sodium (COLACE) 100 MG capsule  Daily PRN     09/10/17 0743    hydrocortisone (ANUSOL-HC) 25 MG suppository  Every 12 hours     09/10/17 0743       Note:  This document was prepared using Dragon voice recognition software and may include unintentional dictation errors.    Joni ReiningSmith, Ronald K, PA-C 09/10/17 0750    Nita SickleVeronese, Arona, MD 09/10/17 [phone]

## 2017-09-10 NOTE — ED Notes (Signed)
Pt was ambulatory. NAD. No questions or concerns voiced on her discharge instruction. Skin PWD. VS WNL.

## 2017-09-10 NOTE — ED Triage Notes (Signed)
Pt c/o hemorrhoid pain for the past 3 weeks, states she has used OTC creams and meds with no relief, states she work 9hrs days on her feet..Marland Kitchen

## 2018-06-29 ENCOUNTER — Emergency Department
Admission: EM | Admit: 2018-06-29 | Discharge: 2018-06-29 | Disposition: A | Discharge status: Home / Self Care | Payer: BLUE CROSS/BLUE SHIELD | Attending: Emergency Medicine | Admitting: Emergency Medicine

## 2018-06-29 ENCOUNTER — Other Ambulatory Visit: Payer: Self-pay

## 2018-06-29 ENCOUNTER — Emergency Department: Payer: BLUE CROSS/BLUE SHIELD

## 2018-06-29 DIAGNOSIS — Y9389 Activity, other specified: Secondary | ICD-10-CM | POA: Diagnosis not present

## 2018-06-29 DIAGNOSIS — N189 Chronic kidney disease, unspecified: Secondary | ICD-10-CM | POA: Diagnosis not present

## 2018-06-29 DIAGNOSIS — Y999 Unspecified external cause status: Secondary | ICD-10-CM | POA: Diagnosis not present

## 2018-06-29 DIAGNOSIS — M47892 Other spondylosis, cervical region: Secondary | ICD-10-CM | POA: Diagnosis not present

## 2018-06-29 DIAGNOSIS — M542 Cervicalgia: Secondary | ICD-10-CM | POA: Diagnosis not present

## 2018-06-29 DIAGNOSIS — M47012 Anterior spinal artery compression syndromes, cervical region: Secondary | ICD-10-CM | POA: Insufficient documentation

## 2018-06-29 DIAGNOSIS — W010XXA Fall on same level from slipping, tripping and stumbling without subsequent striking against object, initial encounter: Secondary | ICD-10-CM | POA: Diagnosis not present

## 2018-06-29 DIAGNOSIS — Z87891 Personal history of nicotine dependence: Secondary | ICD-10-CM | POA: Diagnosis not present

## 2018-06-29 DIAGNOSIS — Y9289 Other specified places as the place of occurrence of the external cause: Secondary | ICD-10-CM | POA: Diagnosis not present

## 2018-06-29 DIAGNOSIS — S161XXA Strain of muscle, fascia and tendon at neck level, initial encounter: Secondary | ICD-10-CM

## 2018-06-29 DIAGNOSIS — Z79899 Other long term (current) drug therapy: Secondary | ICD-10-CM | POA: Insufficient documentation

## 2018-06-29 DIAGNOSIS — S199XXA Unspecified injury of neck, initial encounter: Secondary | ICD-10-CM | POA: Diagnosis not present

## 2018-06-29 IMAGING — CR DG CERVICAL SPINE COMPLETE 4+V
1 series · 5 of 5 positions shown · non-contrast
Comparison: None.

CLINICAL DATA: Pain and decreased range of motion. Fall 3 days
prior

EXAM:
CERVICAL SPINE - COMPLETE 4+ VIEW

[Series 1: dg cervical spine complete · 0.14mm/px · 5 of 5 slices shown]
[im 1/5]
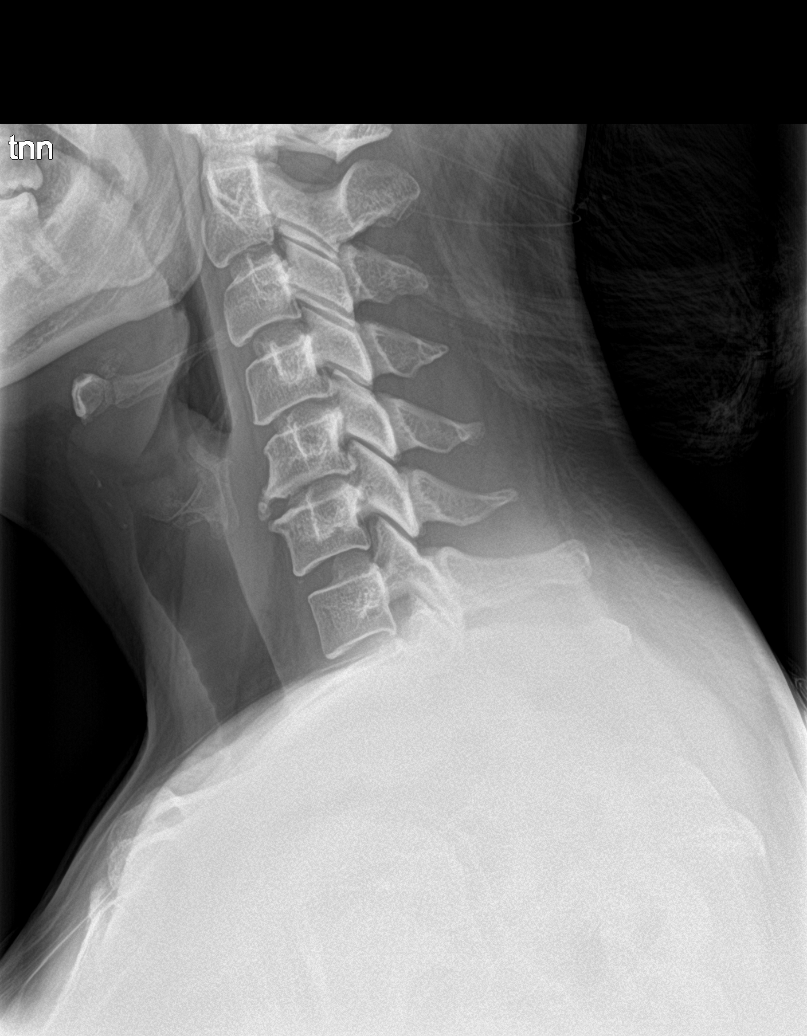
[im 2/5]
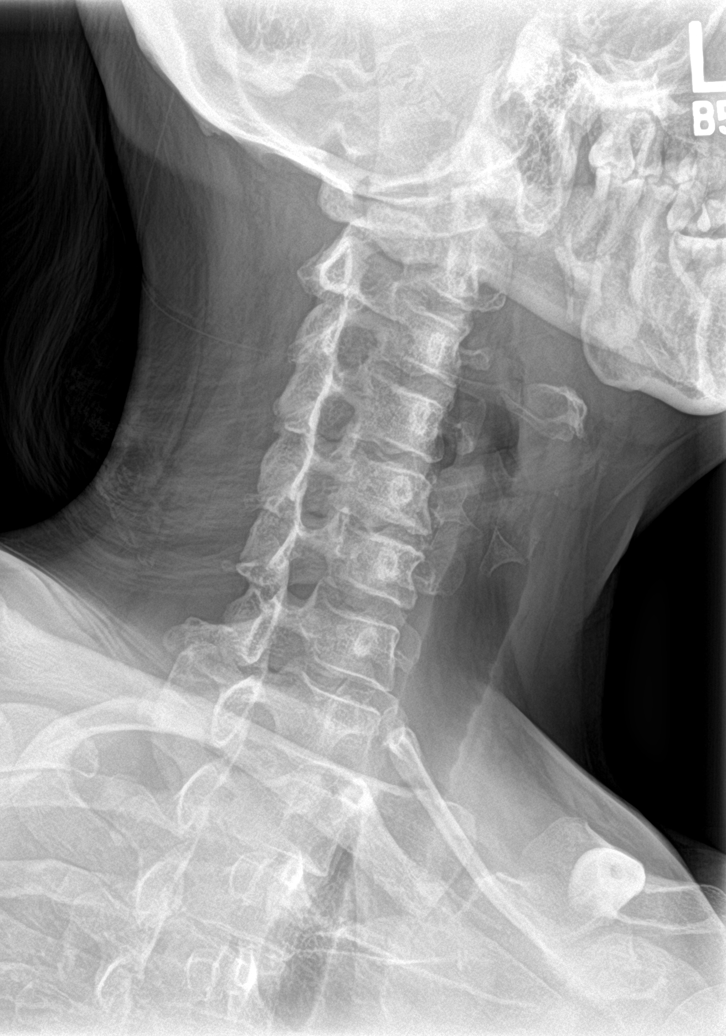
[im 3/5]
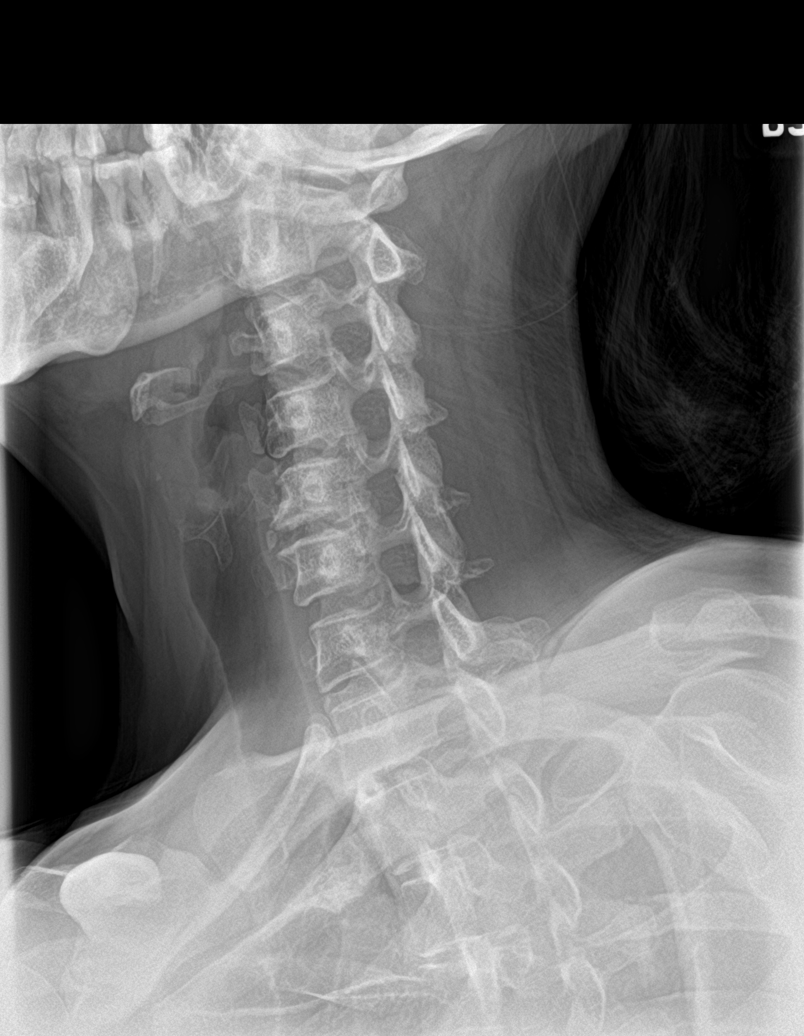
[im 4/5]
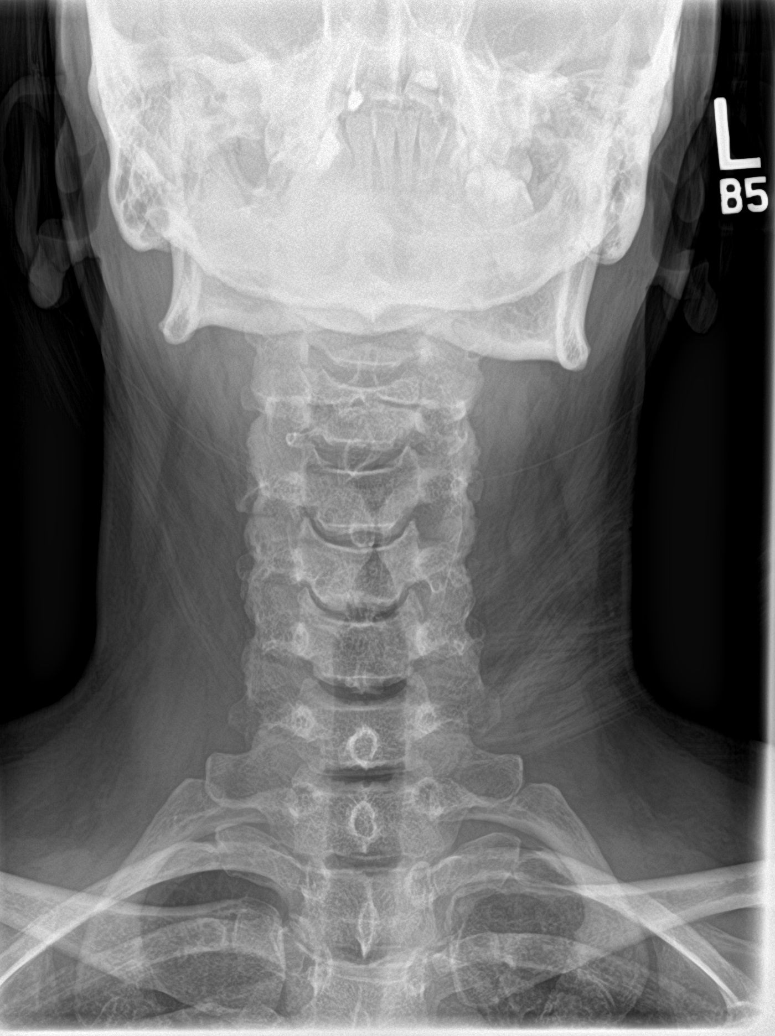
[im 5/5]
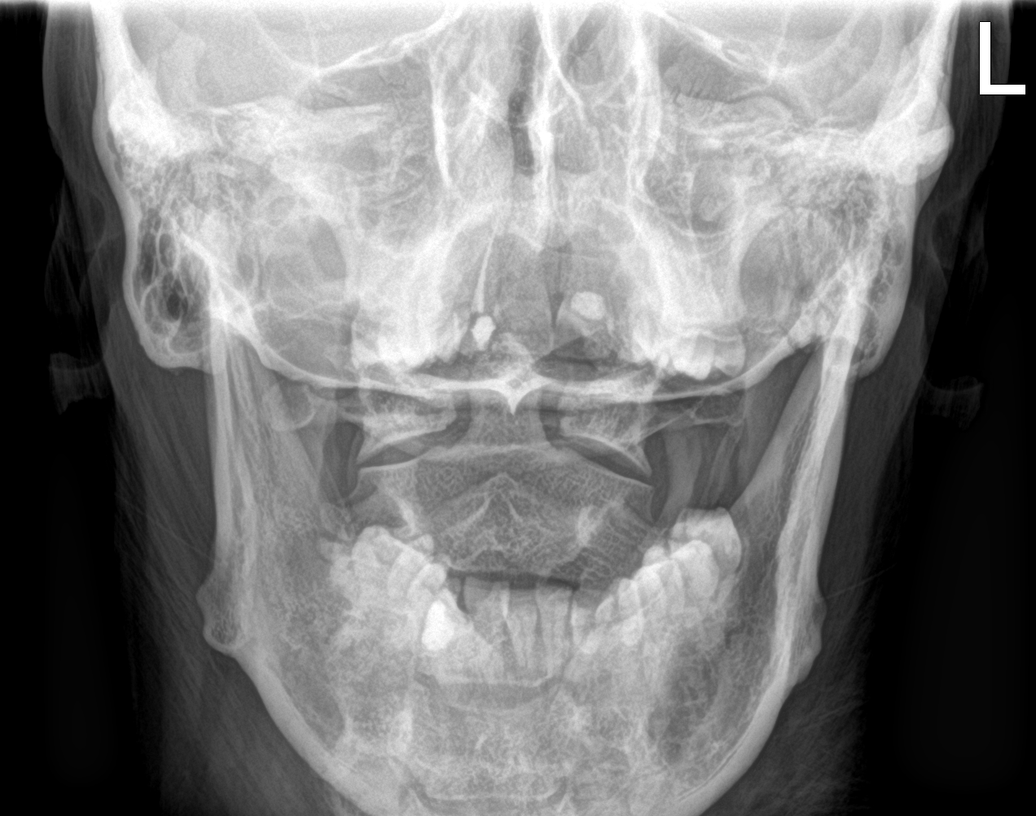

[5 of 5 positions shown; findings below may reference images not displayed]

FINDINGS: Frontal, lateral, open-mouth odontoid, and bilateral oblique views
were obtained. There is no appreciable fracture or
spondylolisthesis. Prevertebral soft tissues and predental space
regions are normal. There is fairly mild disc space narrowing at
C5-6. There is minimal disc space narrowing at C4-5. Other disc
spaces appear normal. There are anterior osteophytes at C5 and C6
with calcification in the anterior ligament at C5-6. There is facet
osteoarthritic change at C5-6 bilaterally. To a lesser extent, there
is facet osteoarthritic change at C4-5 bilaterally. Lung apices are
clear.
IMPRESSION: Osteoarthritic change, most notably at C5-6. No fracture or
spondylolisthesis.

## 2018-06-29 MED ORDER — METHYLPREDNISOLONE 4 MG PO TBPK: ORAL_TABLET | ORAL | 0 refills | Status: DC

## 2018-06-29 MED ORDER — ORPHENADRINE CITRATE ER 100 MG PO TB12: 100.0000 mg | ORAL_TABLET | Freq: Two times a day (BID) | ORAL | 0 refills | Status: DC

## 2018-06-29 NOTE — ED Triage Notes (Signed)
Pt states she tried to jump a hill on Saturday and jarred something, pt c/o neck and BL shoulder pain,

## 2018-06-29 NOTE — ED Provider Notes (Signed)
[facility] Emergency Department Provider Note   ____________________________________________   First MD Initiated Contact with Patient 06/29/18 [phone]     (approximate)  I have reviewed the triage vital signs and the nursing notes.   HISTORY  Chief Complaint Neck Pain    HPI Christine Simmons is a 42 y.o. female patient complain of mid posterior neck pain secondary to trying to jump over a creek.  Patient state she landed on her knees and had a giant sensation to her neck.  Now presents with neck and bilateral shoulder pain.  Left shoulder pain is greater than right.  Patient also states that has a tingling sensation in the left upper extremity.  Patient rates the pain as a 7/10.  No palliative measures for complaint.   Past Medical History:  Diagnosis Date  . Arthritis    low back and neck  . Back pain of lumbar region with sciatica   . Chronic kidney disease    kidney stones and infection  . Degenerative disc disease, cervical   . Family history of adverse reaction to anesthesia    "unable to put mother to sleep"  . GERD (gastroesophageal reflux disease)   . Lichen   . Menorrhagia   . Ovarian cyst     Patient Active Problem List   Diagnosis Date Noted  . Post-operative state 07/07/2016  . Cholelithiasis 04/10/2015    Past Surgical History:  Procedure Laterality Date  . ANKLE SURGERY Left   . CESAREAN SECTION    . CHOLECYSTECTOMY N/A 04/10/2015   Procedure: LAPAROSCOPIC CHOLECYSTECTOMY WITH INTRAOPERATIVE CHOLANGIOGRAM;  Surgeon: Lattie Haw, MD;  Location: [facility] ORS;  Service: General;  Laterality: N/A;  . ESOPHAGOGASTRODUODENOSCOPY (EGD) WITH PROPOFOL N/A 12/12/2016   Procedure: ESOPHAGOGASTRODUODENOSCOPY (EGD) WITH PROPOFOL;  Surgeon: Christena Deem, MD;  Location: [facility] ENDOSCOPY;  Service: Endoscopy;  Laterality: N/A;  . LAPAROSCOPIC VAGINAL HYSTERECTOMY WITH SALPINGECTOMY Bilateral 07/07/2016   Procedure: LAPAROSCOPIC ASSISTED  VAGINAL HYSTERECTOMY WITH SALPINGECTOMY;  Surgeon: Suzy Bouchard, MD;  Location: [facility] ORS;  Service: Gynecology;  Laterality: Bilateral;    Prior to Admission medications   Medication Sig Start Date End Date Taking? Authorizing Provider  docusate sodium (COLACE) 100 MG capsule Take 1 capsule (100 mg total) daily as needed by mouth. 09/10/17 09/10/18  Joni Reining, PA-C  hydrocortisone (ANUSOL-HC) 25 MG suppository Place 1 suppository (25 mg total) every 12 (twelve) hours rectally. 09/10/17 09/10/18  Joni Reining, PA-C  ibuprofen (ADVIL) 200 MG tablet Take 3 tablets (600 mg total) by mouth every 6 (six) hours as needed for mild pain. 07/08/16   Schermerhorn, Ihor Austin, MD  methylPREDNISolone (MEDROL DOSEPAK) 4 MG TBPK tablet Take Tapered dose as directed 06/29/18   Joni Reining, PA-C  orphenadrine (NORFLEX) 100 MG tablet Take 1 tablet (100 mg total) by mouth 2 (two) times daily. 06/29/18   Joni Reining, PA-C  pantoprazole (PROTONIX) 40 MG tablet Take 40 mg by mouth daily as needed.     [provider]  traMADol (ULTRAM) 50 MG tablet Take 1 tablet (50 mg total) by mouth every 6 (six) hours as needed. 07/23/17 07/23/18  Willy Eddy, MD    Allergies Sulfa antibiotics  Family History  Problem Relation Age of Onset  . Hypertension Mother   . Gallbladder disease Maternal Grandmother     Social History Social History   Tobacco Use  . Smoking status: Former Smoker    Last attempt to quit: 04/09/1997  Years since quitting: 21.2  . Smokeless tobacco: Never Used  Substance Use Topics  . Alcohol use: No  . Drug use: No    Review of Systems Constitutional: No fever/chills Eyes: No visual changes. ENT: No sore throat. Cardiovascular: Denies chest pain. Respiratory: Denies shortness of breath. Gastrointestinal: No abdominal pain.  No nausea, no vomiting.  No diarrhea.  No constipation. Genitourinary: Negative for dysuria. Musculoskeletal: Positive for neck and  back pain Skin: Negative for rash. Neurological: Negative for headaches, focal weakness or numbness. Allergic/Immunilogical: Sulfa antibiotics ____________________________________________   PHYSICAL EXAM:  VITAL SIGNS: ED Triage Vitals  Enc Vitals Group     BP 06/29/18 0742 118/80     Pulse Rate 06/29/18 0742 71     Resp 06/29/18 0742 16     Temp 06/29/18 0742 97.8 F (36.6 C)     Temp Source 06/29/18 0742 Oral     SpO2 06/29/18 0742 100 %     Weight 06/29/18 0739 150 lb (68 kg)     Height 06/29/18 0739 5\' 2"  (1.575 m)     Head Circumference --      Peak Flow --      Pain Score 06/29/18 0739 7     Pain Loc --      Pain Edu? --      Excl. in GC? --    Constitutional: Alert and oriented. Well appearing and in no acute distress. Neck: No stridor.  Hematological/Lymphatic/Immunilogical: No cervical lymphadenopathy. Cardiovascular: Normal rate, regular rhythm. Grossly normal heart sounds.  Good peripheral circulation. Respiratory: Normal respiratory effort.  No retractions. Lungs CTAB. Gastrointestinal: Soft and nontender. No distention. No abdominal bruits. No CVA tenderness. Musculoskeletal: No obvious deformity to the cervical spine or upper extremities.. Neurologic:  Normal speech and language. No gross focal neurologic deficits are appreciated. No gait instability. Skin:  Skin is warm, dry and intact. No rash noted. Psychiatric: Mood and affect are normal. Speech and behavior are normal.  ____________________________________________   LABS (all labs ordered are listed, but only abnormal results are displayed)  Labs Reviewed - No data to display ____________________________________________  EKG   ____________________________________________  RADIOLOGY  ED MD interpretation:    Official radiology report(s): Dg Cervical Spine Complete  Result Date: 06/29/2018 CLINICAL DATA:  Pain and decreased range of motion. Fall 3 days prior EXAM: CERVICAL SPINE - COMPLETE 4+  VIEW COMPARISON:  None. FINDINGS: Frontal, lateral, open-mouth odontoid, and bilateral oblique views were obtained. There is no appreciable fracture or spondylolisthesis. Prevertebral soft tissues and predental space regions are normal. There is fairly mild disc space narrowing at C5-6. There is minimal disc space narrowing at C4-5. Other disc spaces appear normal. There are anterior osteophytes at C5 and C6 with calcification in the anterior ligament at C5-6. There is facet osteoarthritic change at C5-6 bilaterally. To a lesser extent, there is facet osteoarthritic change at C4-5 bilaterally. Lung apices are clear. IMPRESSION: Osteoarthritic change, most notably at C5-6. No fracture or spondylolisthesis. Electronically Signed   By: Bretta Bang III M.D.   On: 06/29/2018 08:11    ____________________________________________   PROCEDURES  Procedure(s) performed: None  Procedures  Critical Care performed: No  ____________________________________________   INITIAL IMPRESSION / ASSESSMENT AND PLAN / ED COURSE  As part of my medical decision making, I reviewed the following data within the electronic MEDICAL RECORD NUMBER    Neck pain secondary to cervical strain.  Discussed x-ray findings patient show degenerative changes in the cervical spine.  Patient given  discharge care instruction work note.  Patient advised follow-up PCP for continued care.  Take medication as directed.      ____________________________________________   FINAL CLINICAL IMPRESSION(S) / ED DIAGNOSES  Final diagnoses:  Acute strain of neck muscle, initial encounter  Other osteoarthritis of spine, cervical region     ED Discharge Orders         Ordered    methylPREDNISolone (MEDROL DOSEPAK) 4 MG TBPK tablet     06/29/18 0822    orphenadrine (NORFLEX) 100 MG tablet  2 times daily     06/29/18 1610           Note:  This document was prepared using Dragon voice recognition software and may include  unintentional dictation errors.    Joni Reining, PA-C 06/29/18 0827    Emily Filbert, MD 06/29/18 [phone]

## 2018-06-29 NOTE — ED Notes (Signed)
See triage note  States she jumped from a hill  Landed on her knee  Having pain to neck which radiates into mid back

## 2018-07-23 DIAGNOSIS — R202 Paresthesia of skin: Secondary | ICD-10-CM | POA: Diagnosis not present

## 2018-07-23 DIAGNOSIS — Z882 Allergy status to sulfonamides status: Secondary | ICD-10-CM | POA: Diagnosis not present

## 2018-07-23 DIAGNOSIS — Z6827 Body mass index (BMI) 27.0-27.9, adult: Secondary | ICD-10-CM | POA: Diagnosis not present

## 2018-07-23 DIAGNOSIS — M5412 Radiculopathy, cervical region: Secondary | ICD-10-CM | POA: Diagnosis not present

## 2018-07-23 DIAGNOSIS — M7912 Myalgia of auxiliary muscles, head and neck: Secondary | ICD-10-CM | POA: Diagnosis not present

## 2018-07-23 DIAGNOSIS — Z9049 Acquired absence of other specified parts of digestive tract: Secondary | ICD-10-CM | POA: Diagnosis not present

## 2018-07-23 DIAGNOSIS — M549 Dorsalgia, unspecified: Secondary | ICD-10-CM | POA: Diagnosis not present

## 2018-07-23 DIAGNOSIS — M47814 Spondylosis without myelopathy or radiculopathy, thoracic region: Secondary | ICD-10-CM | POA: Diagnosis not present

## 2018-07-23 DIAGNOSIS — S299XXA Unspecified injury of thorax, initial encounter: Secondary | ICD-10-CM | POA: Diagnosis not present

## 2018-07-23 DIAGNOSIS — M47812 Spondylosis without myelopathy or radiculopathy, cervical region: Secondary | ICD-10-CM | POA: Diagnosis not present

## 2018-07-27 DIAGNOSIS — M79601 Pain in right arm: Secondary | ICD-10-CM | POA: Diagnosis not present

## 2018-07-27 DIAGNOSIS — R202 Paresthesia of skin: Secondary | ICD-10-CM | POA: Diagnosis not present

## 2018-08-06 DIAGNOSIS — M5412 Radiculopathy, cervical region: Secondary | ICD-10-CM | POA: Diagnosis not present

## 2018-08-09 ENCOUNTER — Telehealth: Payer: Self-pay | Admitting: *Deleted

## 2018-08-09 DIAGNOSIS — M542 Cervicalgia: Secondary | ICD-10-CM | POA: Diagnosis not present

## 2018-08-09 DIAGNOSIS — Z6827 Body mass index (BMI) 27.0-27.9, adult: Secondary | ICD-10-CM | POA: Diagnosis not present

## 2018-08-09 DIAGNOSIS — M549 Dorsalgia, unspecified: Secondary | ICD-10-CM | POA: Diagnosis not present

## 2018-08-09 DIAGNOSIS — M5412 Radiculopathy, cervical region: Secondary | ICD-10-CM | POA: Diagnosis not present

## 2018-08-09 NOTE — Telephone Encounter (Signed)
Copied from CRM [id number]. Topic: Appointment Scheduling - New Patient >> Aug 09, 2018  9:55 AM Arlyss Gandy, NT wrote: New patient has been scheduled for your office. Provider: Leanora Cover Date of Appointment: 08/10/18  Route to department's PEC pool.

## 2018-08-10 ENCOUNTER — Encounter: Payer: Self-pay | Admitting: Family Medicine

## 2018-08-10 ENCOUNTER — Ambulatory Visit: Payer: BLUE CROSS/BLUE SHIELD | Admitting: Family Medicine

## 2018-08-10 VITALS — BP 106/78 | HR 80 | Temp 98.4°F | Ht 63.5 in | Wt 150.8 lb

## 2018-08-10 DIAGNOSIS — R5383 Other fatigue: Secondary | ICD-10-CM | POA: Diagnosis not present

## 2018-08-10 DIAGNOSIS — M546 Pain in thoracic spine: Secondary | ICD-10-CM | POA: Diagnosis not present

## 2018-08-10 DIAGNOSIS — G8929 Other chronic pain: Secondary | ICD-10-CM

## 2018-08-10 DIAGNOSIS — M5412 Radiculopathy, cervical region: Secondary | ICD-10-CM

## 2018-08-10 DIAGNOSIS — Z8349 Family history of other endocrine, nutritional and metabolic diseases: Secondary | ICD-10-CM | POA: Diagnosis not present

## 2018-08-10 DIAGNOSIS — M545 Low back pain, unspecified: Secondary | ICD-10-CM

## 2018-08-10 DIAGNOSIS — Z1231 Encounter for screening mammogram for malignant neoplasm of breast: Secondary | ICD-10-CM

## 2018-08-10 DIAGNOSIS — Z23 Encounter for immunization: Secondary | ICD-10-CM

## 2018-08-10 LAB — COMPREHENSIVE METABOLIC PANEL
ALT: 41 U/L — AB (ref 0–35)
AST: 22 U/L (ref 0–37)
Albumin: 4.5 g/dL (ref 3.5–5.2)
Alkaline Phosphatase: 61 U/L (ref 39–117)
BUN: 16 mg/dL (ref 6–23)
CALCIUM: 9.7 mg/dL (ref 8.4–10.5)
CHLORIDE: 104 meq/L (ref 96–112)
CO2: 26 meq/L (ref 19–32)
Creatinine, Ser: 0.65 mg/dL (ref 0.40–1.20)
GFR: 107.25 mL/min (ref >60.00)
GLUCOSE: 121 mg/dL — AB (ref 70–99)
POTASSIUM: 3.7 meq/L (ref 3.5–5.1)
Sodium: 138 mEq/L (ref 135–145)
Total Bilirubin: 0.8 mg/dL (ref 0.2–1.2)
Total Protein: 7.3 g/dL (ref 6.0–8.3)

## 2018-08-10 LAB — CBC
HCT: 41.4 % (ref 36.0–46.0)
HEMOGLOBIN: 14 g/dL (ref 12.0–15.0)
MCHC: 33.9 g/dL (ref 30.0–36.0)
MCV: 88.7 fl (ref 78.0–100.0)
PLATELETS: 272 10*3/uL (ref 150.0–400.0)
RBC: 4.67 Mil/uL (ref 3.87–5.11)
RDW: 12.3 % (ref 11.5–15.5)
WBC: 5.5 10*3/uL (ref 4.0–10.5)

## 2018-08-10 LAB — VITAMIN D 25 HYDROXY (VIT D DEFICIENCY, FRACTURES): VITD: 33.64 ng/mL (ref 30.00–100.00)

## 2018-08-10 LAB — TSH: TSH: 1.04 u[IU]/mL (ref 0.35–4.50)

## 2018-08-10 LAB — LIPID PANEL
CHOL/HDL RATIO: 4
CHOLESTEROL: 172 mg/dL (ref 0–200)
HDL: 46.7 mg/dL (ref >39.00)
LDL CALC: 104 mg/dL — AB (ref 0–99)
NonHDL: 125.7
TRIGLYCERIDES: 111 mg/dL (ref 0.0–149.0)
VLDL: 22.2 mg/dL (ref 0.0–40.0)

## 2018-08-10 LAB — B12 AND FOLATE PANEL
Folate: 12.2 ng/mL (ref >5.9)
VITAMIN B 12: 263 pg/mL (ref 211–911)
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
## 2018-08-12 ENCOUNTER — Telehealth: Payer: Self-pay | Admitting: Family Medicine

## 2018-08-12 DIAGNOSIS — Z0279 Encounter for issue of other medical certificate: Secondary | ICD-10-CM

## 2018-08-12 NOTE — Telephone Encounter (Signed)
Christine Simmons has paperwork and working on it.

## 2018-08-12 NOTE — Telephone Encounter (Signed)
Pt dropped off a short term disability form to be completed. Form needs to be ready for pick up by Monday if possible. Form is up front in color folder.

## 2018-08-19 DIAGNOSIS — M4802 Spinal stenosis, cervical region: Secondary | ICD-10-CM | POA: Diagnosis not present

## 2018-08-19 DIAGNOSIS — M50221 Other cervical disc displacement at C4-C5 level: Secondary | ICD-10-CM | POA: Diagnosis not present

## 2018-08-19 DIAGNOSIS — M9951 Intervertebral disc stenosis of neural canal of cervical region: Secondary | ICD-10-CM | POA: Diagnosis not present

## 2018-08-19 DIAGNOSIS — M50222 Other cervical disc displacement at C5-C6 level: Secondary | ICD-10-CM | POA: Diagnosis not present

## 2018-08-19 DIAGNOSIS — M549 Dorsalgia, unspecified: Secondary | ICD-10-CM | POA: Diagnosis not present

## 2018-08-20 NOTE — Telephone Encounter (Signed)
Pt would like to pick up paperwork on 08/24/18. Pt has an appt with lauren. Pt does not have an appt with spinal doctor until 08-25-18. Pt would like to know if she should stay out of work until she sees spinal doctor pt will discuss with lauren on 08-24-18

## 2018-08-20 NOTE — Telephone Encounter (Signed)
Paperwork for Pt is in the front to be picked up. Christine Simmons took it to the front.

## 2018-08-24 ENCOUNTER — Ambulatory Visit: Payer: BLUE CROSS/BLUE SHIELD | Admitting: Family Medicine

## 2018-08-24 ENCOUNTER — Encounter: Payer: Self-pay | Admitting: Family Medicine

## 2018-08-24 VITALS — BP 102/60 | HR 76 | Temp 98.3°F | Ht 63.5 in | Wt 155.0 lb

## 2018-08-24 DIAGNOSIS — M545 Low back pain, unspecified: Secondary | ICD-10-CM

## 2018-08-24 DIAGNOSIS — R740 Nonspecific elevation of levels of transaminase and lactic acid dehydrogenase [LDH]: Secondary | ICD-10-CM | POA: Diagnosis not present

## 2018-08-24 DIAGNOSIS — R7401 Elevation of levels of liver transaminase levels: Secondary | ICD-10-CM

## 2018-08-24 DIAGNOSIS — R748 Abnormal levels of other serum enzymes: Secondary | ICD-10-CM

## 2018-08-24 DIAGNOSIS — M546 Pain in thoracic spine: Secondary | ICD-10-CM | POA: Diagnosis not present

## 2018-08-24 DIAGNOSIS — M5412 Radiculopathy, cervical region: Secondary | ICD-10-CM | POA: Diagnosis not present

## 2018-08-24 DIAGNOSIS — G8929 Other chronic pain: Secondary | ICD-10-CM

## 2018-08-24 LAB — HEPATIC FUNCTION PANEL
ALK PHOS: 65 U/L (ref 39–117)
ALT: 89 U/L — AB (ref 0–35)
AST: 63 U/L — AB (ref 0–37)
Albumin: 4.4 g/dL (ref 3.5–5.2)
BILIRUBIN DIRECT: 0.2 mg/dL (ref 0.0–0.3)
BILIRUBIN TOTAL: 0.9 mg/dL (ref 0.2–1.2)
TOTAL PROTEIN: 7.4 g/dL (ref 6.0–8.3)

## 2018-08-24 NOTE — Progress Notes (Signed)
Subjective:    Patient ID: Christine Simmons, female    DOB: 27-Mar-1976, 41 y.o.   MRN: [id number]  HPI   Patient presents to clinic for follow up on pain and to assess readiness to go back to work. She had MRIs of C, T and L spine done a few days ago (ordered by spine specialist)  Which did not show anything acute but did show degenerative and chronic changes. I was able to see MRI reports via care everywhere. She has appt tomorrow for trigger point injections to help pain.  Patient states her pain is somewhat better in low back and thoracic spine, neck is bothering her the most today.   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   We will also repeat liver panel due to elevated ALT at last visit.   Patient Active Problem List   Diagnosis Date Noted  . Cervical radiculopathy 08/10/2018  . Chronic bilateral low back pain 08/10/2018  . Chronic bilateral thoracic back pain 08/10/2018  . Family history of metabolic and nutritional disorder 08/10/2018  . Fatigue 08/10/2018  . Post-operative state 07/07/2016  . Cholelithiasis 04/10/2015   Social History   Tobacco Use  . Smoking status: Former Smoker    Last attempt to quit: 04/09/1997    Years since quitting: 21.3  . Smokeless tobacco: Never Used  Substance Use Topics  . Alcohol use: No   Review of Systems  Constitutional: Negative for chills, fatigue and fever.  HENT: Negative for congestion, ear pain, sinus pain and sore throat.   Eyes: Negative.   Respiratory: Negative for cough, shortness of breath and wheezing.   Cardiovascular: Negative for chest pain, palpitations and leg swelling.  Gastrointestinal: Negative for abdominal pain, diarrhea, nausea and vomiting.  Genitourinary: Negative for dysuria, frequency and urgency.  Musculoskeletal: neck, mid back and low back pain Skin: Negative for color change, pallor and rash.  Neurological:  Negative for syncope, light-headedness and headaches.  Psychiatric/Behavioral: The patient is not nervous/anxious.       Objective:   Physical Exam   Constitutional: She is oriented to person, place, and time. No distress.   Head: Normocephalic and atraumatic.  Eyes: EOM are normal. No scleral icterus.  Neck: Neck supple. No tracheal deviation present.  Cardiovascular: Normal rate and regular rhythm.  Pulmonary/Chest: Effort normal and breath sounds normal. No respiratory distress.  Musculoskeletal: She exhibits tenderness.       Cervical back: She exhibits decreased range of motion and tenderness.       Thoracic back: She exhibits decreased range of motion and tenderness.       Lumbar back: She exhibits decreased range of motion and tenderness.  ROM of all parts of spine are very stiff. Patient states she has been stiff like this for years.   Neurological: She is alert and oriented to person, place, and time.  Skin: Skin is warm and dry. Capillary refill takes less than 2 seconds. No pallor.  Psychiatric: She has a normal mood and affect. Her behavior is normal.   Nursing note and vitals reviewed.    Vitals:   08/24/18 0841  BP: 102/60  Pulse: 76  Temp: 98.3 F (36.8 C)  SpO2: 98%   Assessment & Plan:   Neck, thoracic and low back pain - Continue to follow up with spine specialty. We will extend her being  out of work until 09/09/18 after she has had her trigger point injections.  Elevated ALT -- Will repeat liver panel. If still elevated will do liver US and GI referral.   Follow up here in 2 weeks to recheck pain.

## 2018-08-25 DIAGNOSIS — M47812 Spondylosis without myelopathy or radiculopathy, cervical region: Secondary | ICD-10-CM | POA: Diagnosis not present

## 2018-08-25 DIAGNOSIS — M4802 Spinal stenosis, cervical region: Secondary | ICD-10-CM | POA: Diagnosis not present

## 2018-08-25 DIAGNOSIS — Z882 Allergy status to sulfonamides status: Secondary | ICD-10-CM | POA: Diagnosis not present

## 2018-08-25 DIAGNOSIS — M5030 Other cervical disc degeneration, unspecified cervical region: Secondary | ICD-10-CM | POA: Diagnosis not present

## 2018-08-25 DIAGNOSIS — M50222 Other cervical disc displacement at C5-C6 level: Secondary | ICD-10-CM | POA: Diagnosis not present

## 2018-08-25 DIAGNOSIS — M542 Cervicalgia: Secondary | ICD-10-CM | POA: Diagnosis not present

## 2018-08-25 DIAGNOSIS — M7912 Myalgia of auxiliary muscles, head and neck: Secondary | ICD-10-CM | POA: Diagnosis not present

## 2018-08-25 DIAGNOSIS — Z9049 Acquired absence of other specified parts of digestive tract: Secondary | ICD-10-CM | POA: Diagnosis not present

## 2018-08-25 DIAGNOSIS — M4856XA Collapsed vertebra, not elsewhere classified, lumbar region, initial encounter for fracture: Secondary | ICD-10-CM | POA: Diagnosis not present

## 2018-08-25 NOTE — Addendum Note (Signed)
Addended by: Leanora Cover on: 08/25/2018 10:46 AM   Modules accepted: Orders

## 2018-08-26 ENCOUNTER — Telehealth: Payer: Self-pay | Admitting: Family Medicine

## 2018-08-26 NOTE — Telephone Encounter (Signed)
Pt called stating that she can bring in another form on 08/27/2018. Pt stated shes going to need it filled out from 08/06/2018-09/09/2018.

## 2018-08-26 NOTE — Telephone Encounter (Signed)
Copied from CRM [id number]. Topic: General - Other >> Aug 26, 2018  2:27 PM Darletta Moll L wrote: Reason for CRM: Patient needs the [facility] or similar form that was filled out by Guse on 08/12/2018 to be corrected. She picked up this form on 08/24/2018 and says it had some out of date range information regarding her condition. I spoke with Erle Crocker, coordinator, who asked that I forward this message and she will have Guse's cma call the patient. It's due no later than 08/30/2018.

## 2018-08-26 NOTE — Telephone Encounter (Signed)
Does she have a FMLA form from her employer? Usually the patient brings these in

## 2018-08-27 NOTE — Telephone Encounter (Signed)
Completed paper work was returned. [employer] rep wants a cleaner version. New form along with original form is up front in color folder. Take note of yellow post note attached to form. Call when ready for pick up.

## 2018-08-27 NOTE — Telephone Encounter (Signed)
Paper work done and brought to Psychologist, [employer] and exercise

## 2018-08-30 ENCOUNTER — Telehealth: Payer: Self-pay | Admitting: Family Medicine

## 2018-08-30 DIAGNOSIS — R748 Abnormal levels of other serum enzymes: Secondary | ICD-10-CM

## 2018-08-30 NOTE — Telephone Encounter (Signed)
[place] order corrected

## 2018-08-30 NOTE — Telephone Encounter (Signed)
I called to get pt [place] scheduled the order needs to be redone. It needs to be US abdomen limited RUQ. Thank you!

## 2018-08-31 NOTE — Telephone Encounter (Signed)
Thank you. Pt is scheduled.

## 2018-09-02 ENCOUNTER — Ambulatory Visit
Admission: RE | Admit: 2018-09-02 | Discharge: 2018-09-02 | Disposition: A | Discharge status: Home / Self Care | Payer: BLUE CROSS/BLUE SHIELD | Source: Ambulatory Visit | Attending: Family Medicine | Admitting: Family Medicine

## 2018-09-02 DIAGNOSIS — Z9049 Acquired absence of other specified parts of digestive tract: Secondary | ICD-10-CM | POA: Insufficient documentation

## 2018-09-02 DIAGNOSIS — R748 Abnormal levels of other serum enzymes: Secondary | ICD-10-CM | POA: Diagnosis not present

## 2018-09-02 DIAGNOSIS — R7989 Other specified abnormal findings of blood chemistry: Secondary | ICD-10-CM | POA: Diagnosis not present

## 2018-09-02 IMAGING — US US ABDOMEN LIMITED
1 series · 14 of 25 positions shown · non-contrast
Comparison: CT abdomen pelvis of [DATE]

CLINICAL DATA: Elevated liver function tests

EXAM:
ULTRASOUND ABDOMEN LIMITED RIGHT UPPER QUADRANT

[Series 1: us abdomen limited · 14 of 32 slices shown]
[im 1/32]
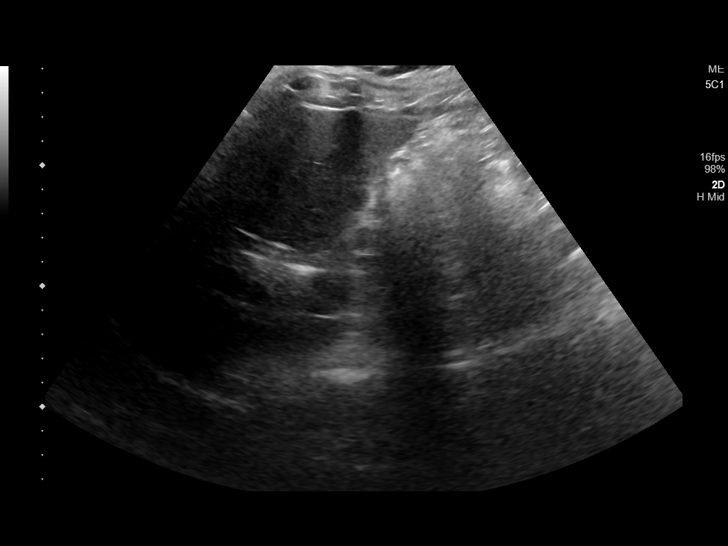
[im 3/32]
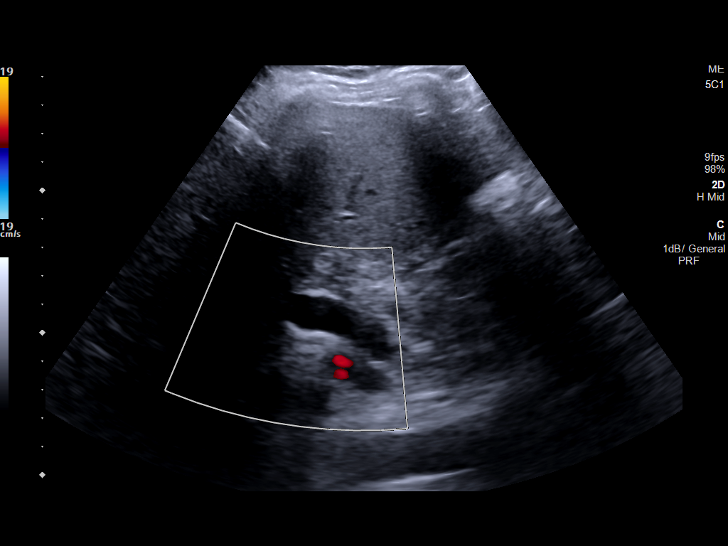
[im 6/32]
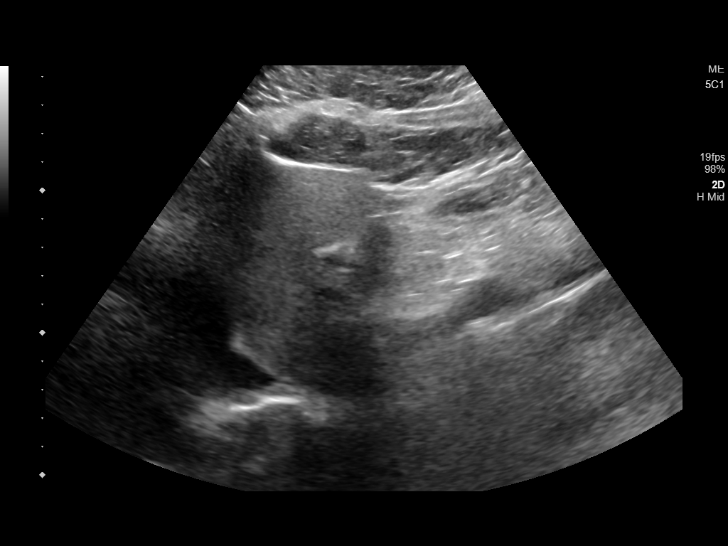
[im 8/32]
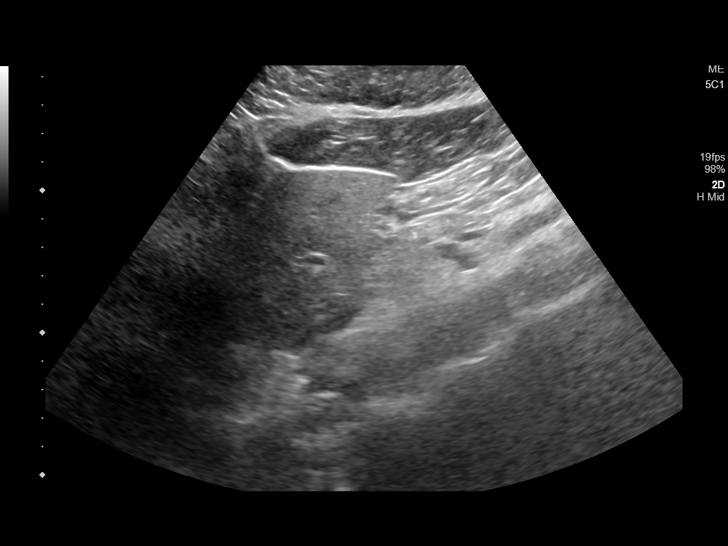
[im 11/32]
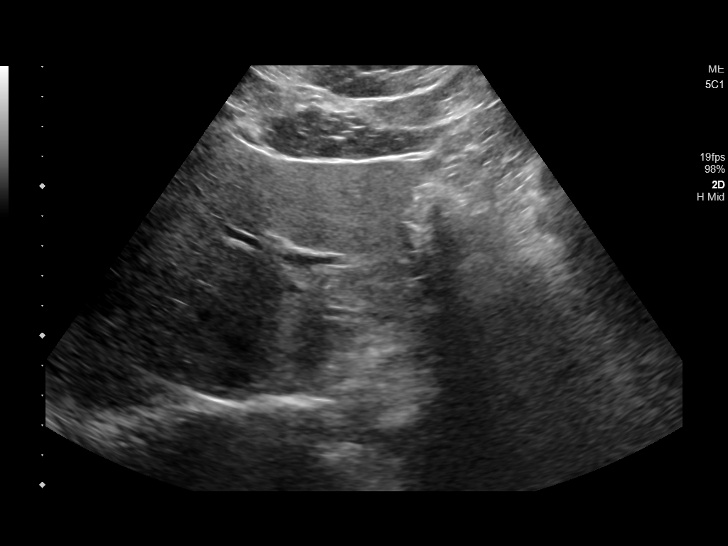
[im 12/32]
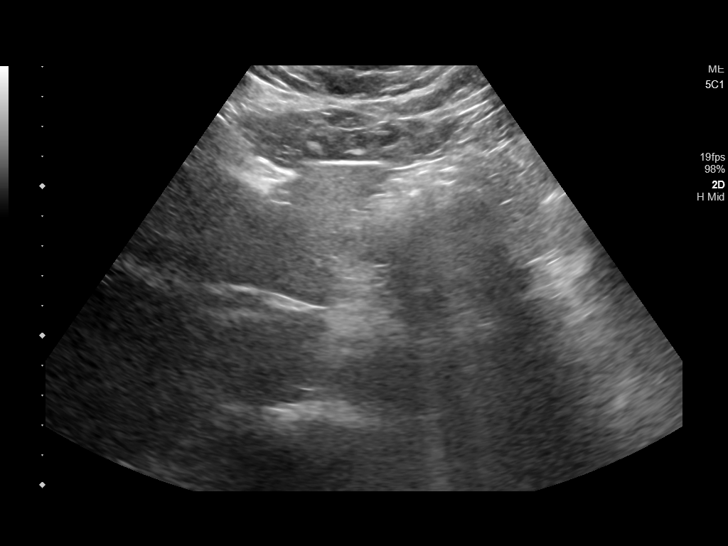
[im 15/32]
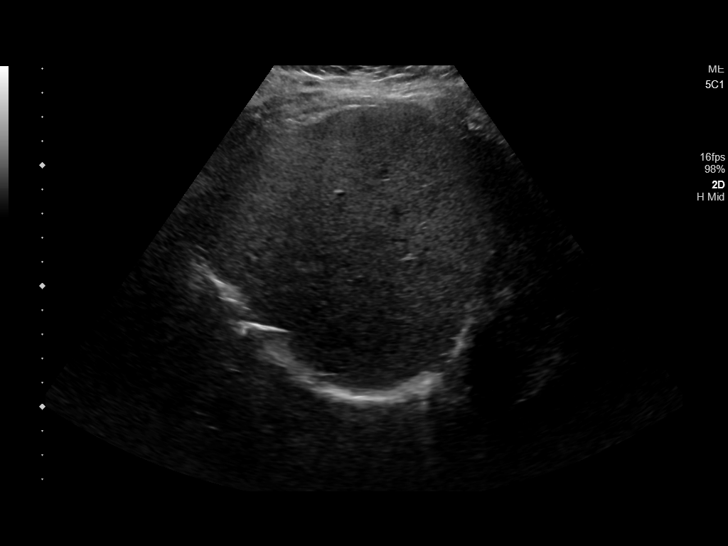
[im 17/32]
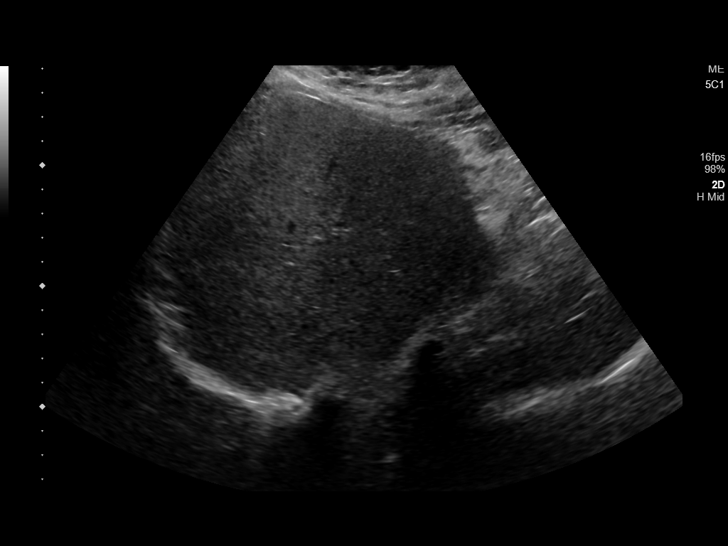
[im 20/32]
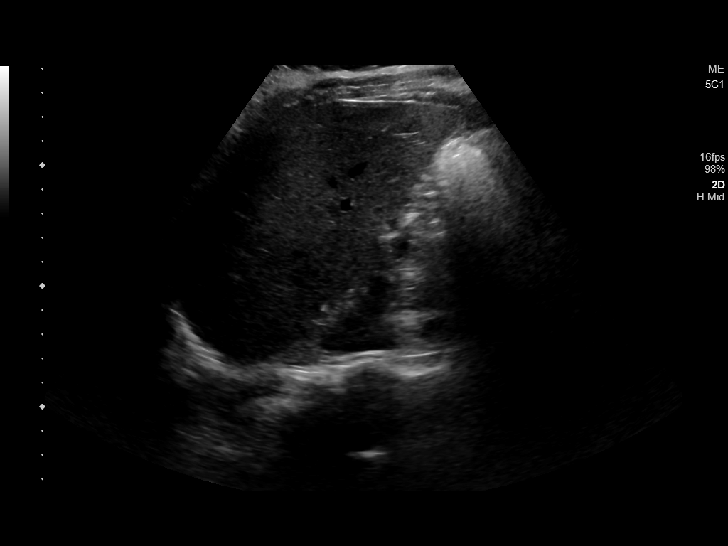
[im 21/32]
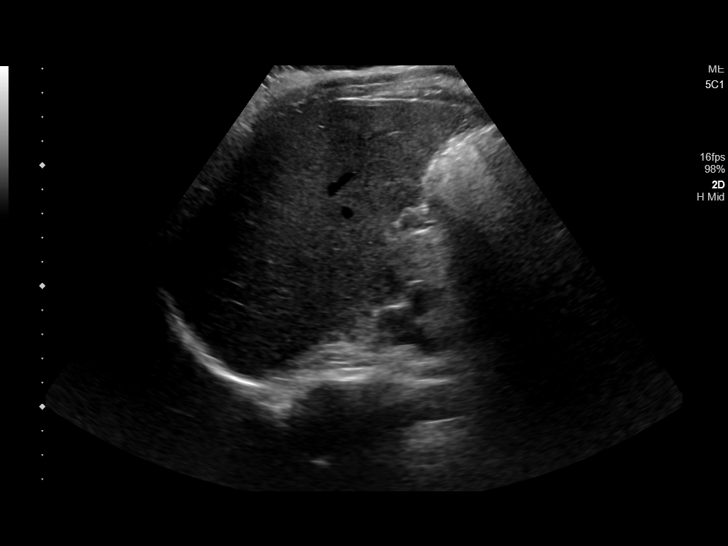
[im 24/32]
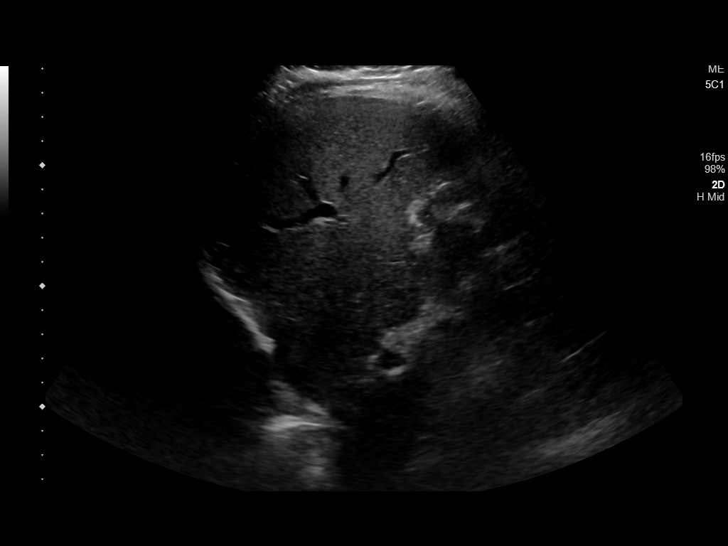
[im 26/32]
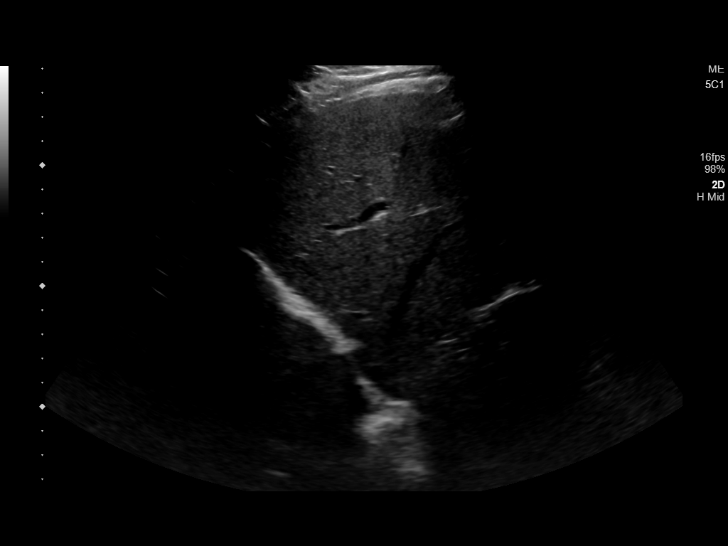
[im 29/32]
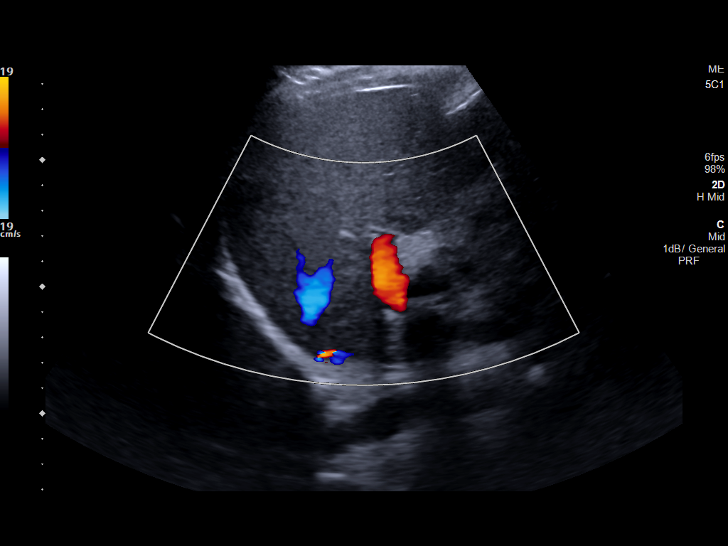
[im 32/32]
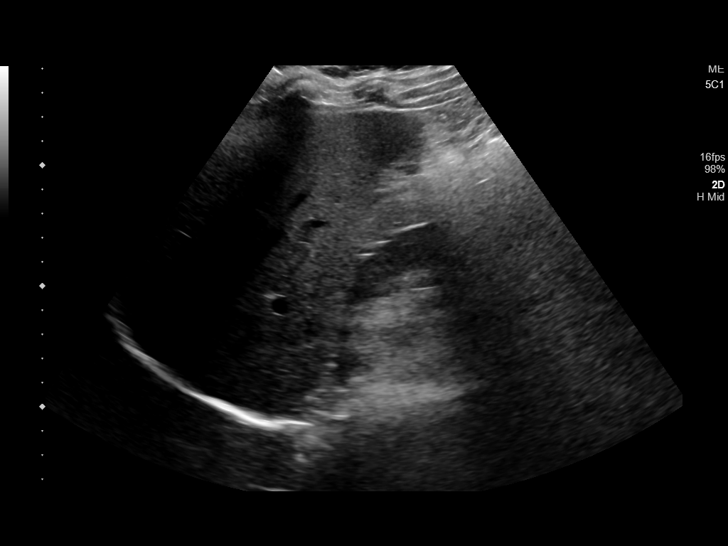

[14 of 25 positions shown; findings below may reference images not displayed]

FINDINGS: Gallbladder:

The gallbladder has previously been resected. There is no pain over
the gallbladder with compression.

Common bile duct:

Diameter: The common bile duct is normal measuring 6.3 mm in
diameter.

Liver:

The liver parenchyma is unremarkable. No focal hepatic abnormality
is seen. Portal blood flow is patent and in the normal direction.
IMPRESSION: 1. Negative limited ultrasound of the right upper quadrant. The
parenchyma of the liver is unremarkable.
2. Prior cholecystectomy.

## 2018-09-03 ENCOUNTER — Telehealth: Payer: Self-pay | Admitting: Family Medicine

## 2018-09-03 NOTE — Telephone Encounter (Signed)
Please advise 

## 2018-09-03 NOTE — Telephone Encounter (Signed)
Pt called and would like to know what her Ultra Sound Results are from yesterday 11/7.

## 2018-09-03 NOTE — Telephone Encounter (Signed)
Copied from CRM [id number]. Topic: Quick Communication - See Telephone Encounter >> Sep 03, 2018  1:20 PM Jolayne Haines L wrote: CRM for notification. See Telephone encounter for: 09/03/18.  Patient would like to know what her Ultra Sound Results are from yesterday 11/7. Call back @ [phone]
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
## 2018-09-07 ENCOUNTER — Encounter: Payer: Self-pay | Admitting: Family Medicine

## 2018-09-07 ENCOUNTER — Ambulatory Visit: Payer: BLUE CROSS/BLUE SHIELD | Admitting: Family Medicine

## 2018-09-07 VITALS — BP 122/86 | HR 77 | Temp 98.2°F | Ht 63.5 in | Wt 157.0 lb

## 2018-09-07 DIAGNOSIS — G8929 Other chronic pain: Secondary | ICD-10-CM

## 2018-09-07 DIAGNOSIS — M545 Low back pain: Secondary | ICD-10-CM | POA: Diagnosis not present

## 2018-09-07 DIAGNOSIS — R748 Abnormal levels of other serum enzymes: Secondary | ICD-10-CM | POA: Diagnosis not present

## 2018-09-07 DIAGNOSIS — M5412 Radiculopathy, cervical region: Secondary | ICD-10-CM

## 2018-09-07 DIAGNOSIS — M546 Pain in thoracic spine: Secondary | ICD-10-CM

## 2018-09-07 MED ORDER — LIDOCAINE 4 % EX PTCH: 1.0000 | MEDICATED_PATCH | Freq: Every day | CUTANEOUS | 5 refills | Status: DC

## 2018-09-10 ENCOUNTER — Telehealth: Payer: Self-pay | Admitting: Family Medicine

## 2018-09-10 DIAGNOSIS — G8929 Other chronic pain: Secondary | ICD-10-CM

## 2018-09-10 DIAGNOSIS — M545 Low back pain: Secondary | ICD-10-CM

## 2018-09-10 DIAGNOSIS — M546 Pain in thoracic spine: Principal | ICD-10-CM

## 2018-09-10 DIAGNOSIS — M5412 Radiculopathy, cervical region: Secondary | ICD-10-CM

## 2018-09-10 MED ORDER — GABAPENTIN 300 MG PO CAPS: 300.0000 mg | ORAL_CAPSULE | Freq: Every day | ORAL | 3 refills | Status: DC

## 2018-09-10 NOTE — Telephone Encounter (Signed)
Copied from CRM [phone]#[id number]. Topic: Quick Communication - See Telephone Encounter >> Sep 10, 2018  3:46 PM Lorrine KinMcGee, Quention Mcneill B, [place] wrote: CRM for notification. See Telephone encounter for: 09/10/18. Patient calling and states that the Lidocaine (PAIN RELIEVING LIDOCAINE) 4 % PTCH are not helping her back pain at all. Would like to know if something else could be prescribed? Please advise. Can hardly walk by 3pm when she is at work. States that she is in a lot of pain. CB#: [phone] [id number] - [place], [place] - [address] [facility]

## 2018-09-10 NOTE — Telephone Encounter (Signed)
We are limited on choices due to her liver functions and also meds making her drowsy  I can do a gabapentin at bedtime for her and we can do pain management referral

## 2018-09-10 NOTE — Telephone Encounter (Signed)
Patient called and states that the Lidocaine (PAIN RELIEVING LIDOCAINE) 4 % PTCH are not helping her back pain at all. Would like to know if something else could be prescribed? Please advise. Can hardly walk by 3pm when she is at work. States that she is in a lot of pain. CB#: [phone] [id number] - [place], [place] - [address] [address] [facility]

## 2018-09-10 NOTE — Telephone Encounter (Signed)
Please advise 

## 2018-09-13 ENCOUNTER — Ambulatory Visit: Payer: Self-pay | Admitting: *Deleted

## 2018-09-13 NOTE — Telephone Encounter (Signed)
Pt returning call. See telephone encounter from 09/10/18.

## 2018-09-13 NOTE — Telephone Encounter (Signed)
Pt returning call to office and recommendations per notes of Lauren,NP on 09/10/18 relayed to the pt. Pt verbalized understanding and is agreeable with recommendations for gabapentin at bedtime and pain management referral 

## 2018-09-13 NOTE — Telephone Encounter (Signed)
Pt returning call to office and recommendations per notes of Lauren,NP on 09/10/18 relayed to the pt. Pt verbalized understanding and is agreeable with recommendations for gabapentin at bedtime and pain management referral

## 2018-09-13 NOTE — Telephone Encounter (Signed)
Called Pt to tell her that NP Leanora CoverLauren Guse stated We are limited on choices of pain medication due to her liver functions and also meds making her (the Pt) drowsy. Leanora CoverLauren Guse suggested taking Gabapentin at bedtime, also she can do pain management referral. Okay for [facility] to talk to the Pt

## 2018-10-04 DIAGNOSIS — R112 Nausea with vomiting, unspecified: Secondary | ICD-10-CM | POA: Diagnosis not present

## 2018-10-04 DIAGNOSIS — R748 Abnormal levels of other serum enzymes: Secondary | ICD-10-CM | POA: Diagnosis not present

## 2018-10-07 ENCOUNTER — Ambulatory Visit: Payer: BLUE CROSS/BLUE SHIELD | Admitting: Student in an Organized Health Care Education/Training Program

## 2018-10-13 ENCOUNTER — Encounter

## 2018-10-13 ENCOUNTER — Ambulatory Visit: Payer: BLUE CROSS/BLUE SHIELD | Admitting: Gastroenterology

## 2018-10-22 ENCOUNTER — Other Ambulatory Visit: Payer: Self-pay | Admitting: Family Medicine

## 2018-10-22 DIAGNOSIS — Z1231 Encounter for screening mammogram for malignant neoplasm of breast: Secondary | ICD-10-CM

## 2018-11-03 ENCOUNTER — Other Ambulatory Visit: Payer: Self-pay

## 2018-11-03 ENCOUNTER — Ambulatory Visit: Payer: Self-pay | Admitting: *Deleted

## 2018-11-03 ENCOUNTER — Encounter: Payer: Self-pay | Admitting: Emergency Medicine

## 2018-11-03 ENCOUNTER — Ambulatory Visit
Admission: EM | Admit: 2018-11-03 | Discharge: 2018-11-03 | Disposition: A | Discharge status: Home / Self Care | Payer: BLUE CROSS/BLUE SHIELD | Attending: Family Medicine | Admitting: Family Medicine

## 2018-11-03 DIAGNOSIS — J0100 Acute maxillary sinusitis, unspecified: Secondary | ICD-10-CM

## 2018-11-03 DIAGNOSIS — Z87891 Personal history of nicotine dependence: Secondary | ICD-10-CM

## 2018-11-03 LAB — RAPID STREP SCREEN (MED CTR MEBANE ONLY): STREPTOCOCCUS, GROUP A SCREEN (DIRECT): NEGATIVE

## 2018-11-03 MED ORDER — AMOXICILLIN 875 MG PO TABS: 875.0000 mg | ORAL_TABLET | Freq: Two times a day (BID) | ORAL | 0 refills | Status: DC

## 2018-11-03 NOTE — ED Provider Notes (Signed)
[facility]    CSN: [id number] Arrival date & time: 11/03/18  0911     History   Chief Complaint Chief Complaint  Patient presents with  . Sore Throat    HPI Christine Simmons is a 43 y.o. female.   The history is provided by the patient.  Sore Throat   URI  Presenting symptoms: congestion, cough, facial pain, fatigue, rhinorrhea and sore throat   Severity:  Moderate Onset quality:  Sudden Duration:  7 days Timing:  Constant Progression:  Worsening Chronicity:  New Relieved by:  Nothing Ineffective treatments:  OTC medications Associated symptoms: sinus pain   Associated symptoms: no wheezing   Risk factors: sick contacts     Past Medical History:  Diagnosis Date  . Arthritis    low back and neck  . Back pain of lumbar region with sciatica   . Chronic kidney disease    kidney stones and infection  . Degenerative disc disease, cervical   . Family history of adverse reaction to anesthesia    "unable to put mother to sleep"  . Frequent headaches   . GERD (gastroesophageal reflux disease)   . Lichen   . Menorrhagia   . Ovarian cyst     Patient Active Problem List   Diagnosis Date Noted  . Cervical radiculopathy 08/10/2018  . Chronic bilateral low back pain 08/10/2018  . Chronic bilateral thoracic back pain 08/10/2018  . Family history of metabolic and nutritional disorder 08/10/2018  . Fatigue 08/10/2018  . Post-operative state 07/07/2016  . Cholelithiasis 04/10/2015    Past Surgical History:  Procedure Laterality Date  . ABDOMINAL HYSTERECTOMY    . ANKLE SURGERY Left   . CESAREAN SECTION    . CHOLECYSTECTOMY N/A 04/10/2015   Procedure: LAPAROSCOPIC CHOLECYSTECTOMY WITH INTRAOPERATIVE CHOLANGIOGRAM;  Surgeon: Lattie Haw, MD;  Location: [facility] ORS;  Service: General;  Laterality: N/A;  . ESOPHAGOGASTRODUODENOSCOPY (EGD) WITH PROPOFOL N/A 12/12/2016   Procedure: ESOPHAGOGASTRODUODENOSCOPY (EGD) WITH PROPOFOL;  Surgeon: Christena Deem,  MD;  Location: [facility] ENDOSCOPY;  Service: Endoscopy;  Laterality: N/A;  . GALLBLADDER SURGERY    . LAPAROSCOPIC VAGINAL HYSTERECTOMY WITH SALPINGECTOMY Bilateral 07/07/2016   Procedure: LAPAROSCOPIC ASSISTED VAGINAL HYSTERECTOMY WITH SALPINGECTOMY;  Surgeon: Suzy Bouchard, MD;  Location: [facility] ORS;  Service: Gynecology;  Laterality: Bilateral;    OB History   No obstetric history on file.      Home Medications    Prior to Admission medications   Medication Sig Start Date End Date Taking? Authorizing Provider  cyclobenzaprine (FLEXERIL) 10 MG tablet Take 10 mg by mouth 3 (three) times daily as needed for muscle spasms.   Yes [provider]  gabapentin (NEURONTIN) 300 MG capsule Take 1 capsule (300 mg total) by mouth at bedtime. 09/10/18  Yes Guse, Janna Arch, FNP  Lidocaine (PAIN RELIEVING LIDOCAINE) 4 % PTCH Apply 1 patch topically daily. Apply to painful area - neck and/or back 09/07/18  Yes Guse, Janna Arch, FNP  pantoprazole (PROTONIX) 40 MG tablet Take 40 mg by mouth daily as needed.    Yes [provider]  amoxicillin (AMOXIL) 875 MG tablet Take 1 tablet (875 mg total) by mouth 2 (two) times daily. 11/03/18   Payton Mccallum, MD    Family History Family History  Problem Relation Age of Onset  . Hypertension Mother   . COPD Mother   . Arthritis Mother   . Gallbladder disease Maternal Grandmother   . Heart disease Maternal Grandmother   .  Hyperlipidemia Maternal Grandmother   . Intellectual disability Maternal Grandmother   . Hypertension Maternal Grandmother   . Heart attack Maternal Grandmother   . Hypertension Father   . Alcohol abuse Maternal Grandfather   . Cancer Maternal Grandfather   . COPD Maternal Grandfather   . Autoimmune disease Paternal Aunt     Social History Social History   Tobacco Use  . Smoking status: Former Smoker    Last attempt to quit: 04/09/1997    Years since quitting: 21.5  . Smokeless tobacco: Never Used  Substance Use  Topics  . Alcohol use: No  . Drug use: No     Allergies   Sulfa antibiotics   Review of Systems Review of Systems  Constitutional: Positive for fatigue.  HENT: Positive for congestion, rhinorrhea, sinus pain and sore throat.   Respiratory: Positive for cough. Negative for wheezing.      Physical Exam Triage Vital Signs ED Triage Vitals  Enc Vitals Group     BP 11/03/18 0930 112/79     Pulse Rate 11/03/18 0930 79     Resp 11/03/18 0930 18     Temp 11/03/18 0930 97.7 F (36.5 C)     Temp Source 11/03/18 0930 Oral     SpO2 11/03/18 0930 99 %     Weight 11/03/18 0929 150 lb (68 kg)     Height 11/03/18 0929 5\' 2"  (1.575 m)     Head Circumference --      Peak Flow --      Pain Score 11/03/18 0929 4     Pain Loc --      Pain Edu? --      Excl. in GC? --    No data found.  Updated Vital Signs BP 112/79 (BP Location: Left Arm)   Pulse 79   Temp 97.7 F (36.5 C) (Oral)   Resp 18   Ht 5\' 2"  (1.575 m)   Wt 68 kg   LMP 06/18/2016 (Exact Date)   SpO2 99%   BMI 27.44 kg/m   Visual Acuity Right Eye Distance:   Left Eye Distance:   Bilateral Distance:    Right Eye Near:   Left Eye Near:    Bilateral Near:     Physical Exam Vitals signs and nursing note reviewed.  Constitutional:      General: She is not in acute distress.    Appearance: She is well-developed. She is not diaphoretic.  HENT:     Head: Normocephalic and atraumatic.     Right Ear: Tympanic membrane, ear canal and external ear normal.     Left Ear: Tympanic membrane, ear canal and external ear normal.     Nose: Mucosal edema and rhinorrhea present. No nasal deformity, septal deviation or laceration.     Right Sinus: Maxillary sinus tenderness and frontal sinus tenderness present.     Left Sinus: Maxillary sinus tenderness and frontal sinus tenderness present.     Mouth/Throat:     Pharynx: Uvula midline. No oropharyngeal exudate.  Eyes:     General: No scleral icterus.       Right eye: No  discharge.        Left eye: No discharge.     Conjunctiva/sclera: Conjunctivae normal.     Pupils: Pupils are equal, round, and reactive to light.  Neck:     Musculoskeletal: Normal range of motion and neck supple.     Thyroid: No thyromegaly.  Cardiovascular:     Rate and Rhythm:  Normal rate and regular rhythm.     Heart sounds: Normal heart sounds.  Pulmonary:     Effort: Pulmonary effort is normal. No respiratory distress.     Breath sounds: Normal breath sounds. No wheezing or rales.  Lymphadenopathy:     Cervical: No cervical adenopathy.      UC Treatments / Results  Labs (all labs ordered are listed, but only abnormal results are displayed) Labs Reviewed  RAPID STREP SCREEN ([facility] ONLY)  CULTURE, GROUP A STREP [facility])    EKG None  Radiology No results found.  Procedures Procedures (including critical care time)  Medications Ordered in UC Medications - No data to display  Initial Impression / Assessment and Plan / UC Course  I have reviewed the triage vital signs and the nursing notes.  Pertinent labs & imaging results that were available during my care of the patient were reviewed by me and considered in my medical decision making (see chart for details).      Final Clinical Impressions(s) / UC Diagnoses   Final diagnoses:  Acute maxillary sinusitis, recurrence not specified    ED Prescriptions    Medication Sig Dispense Auth. Provider   amoxicillin (AMOXIL) 875 MG tablet Take 1 tablet (875 mg total) by mouth 2 (two) times daily. 20 tablet Payton Mccallumonty, Emnet Monk, MD     1. Lab results and diagnosis reviewed with patient 2. rx as per orders above; reviewed possible side effects, interactions, risks and benefits  3. Recommend supportive treatment with otc flonase 4. Follow-up prn if symptoms worsen or don't improve   Controlled Substance Prescriptions [place] Controlled Substance Registry consulted? Not Applicable   Payton Mccallumonty, Lindy Garczynski, MD 11/03/18  1154

## 2018-11-03 NOTE — Telephone Encounter (Signed)
Patient is calling to report that she has had chest congestion and tightness with breathing since Thanksgiving. Patient is not getting better. Patient is very hoarse and is having trouble taking deep breaths.  No appointment available at office- per protocol- patient should be seen within 4 hours- UC advised.  Reason for Disposition . [1] MILD difficulty breathing (e.g., minimal/no SOB at rest, SOB with walking, pulse <100) AND [2] NEW-onset or WORSE than normal  Answer Assessment - Initial Assessment Questions 1. RESPIRATORY STATUS: "Describe your breathing?" (e.g., wheezing, shortness of breath, unable to speak, severe coughing)      Chest tightness, sore throat, losing voice, headache 2. ONSET: "When did this breathing problem begin?"      November- coming and going 3. PATTERN "Does the difficult breathing come and go, or has it been constant since it started?"      Comes and goes 4. SEVERITY: "How bad is your breathing?" (e.g., mild, moderate, severe)    - MILD: No SOB at rest, mild SOB with walking, speaks normally in sentences, can lay down, no retractions, pulse < 100.    - MODERATE: SOB at rest, SOB with minimal exertion and prefers to sit, cannot lie down flat, speaks in phrases, mild retractions, audible wheezing, pulse 100-120.    - SEVERE: Very SOB at rest, speaks in single words, struggling to breathe, sitting hunched forward, retractions, pulse > 120      Mild- patient feels very drained 5. RECURRENT SYMPTOM: "Have you had difficulty breathing before?" If so, ask: "When was the last time?" and "What happened that time?"      no 6. CARDIAC HISTORY: "Do you have any history of heart disease?" (e.g., heart attack, angina, bypass surgery, angioplasty)      no 7. LUNG HISTORY: "Do you have any history of lung disease?"  (e.g., pulmonary embolus, asthma, emphysema)     no 8. CAUSE: "What do you think is causing the breathing problem?"      Coughing a little, deep breathes drain  her 9. OTHER SYMPTOMS: "Do you have any other symptoms? (e.g., dizziness, runny nose, cough, chest pain, fever)     Runny nose, fever last week 10. PREGNANCY: "Is there any chance you are pregnant?" "When was your last menstrual period?"       No- hysterectomy 11. TRAVEL: "Have you traveled out of the country in the last month?" (e.g., travel history, exposures)       no  Protocols used: BREATHING DIFFICULTY-A-AH

## 2018-11-03 NOTE — ED Triage Notes (Signed)
Patient c/o sore throat and fever that started 1 week ago. Patient reports she started coughing this morning. Patient has been taking Mucinex and Tylenol for her symptoms.

## 2018-11-05 ENCOUNTER — Ambulatory Visit: Payer: BLUE CROSS/BLUE SHIELD | Admitting: Family Medicine

## 2018-11-06 LAB — CULTURE, GROUP A STREP (THRC)

## 2018-11-08 ENCOUNTER — Ambulatory Visit: Payer: BLUE CROSS/BLUE SHIELD | Admitting: Family Medicine

## 2018-11-09 ENCOUNTER — Ambulatory Visit: Payer: BLUE CROSS/BLUE SHIELD | Admitting: Student in an Organized Health Care Education/Training Program

## 2018-11-12 ENCOUNTER — Encounter: Payer: Self-pay | Admitting: Family Medicine

## 2018-11-12 ENCOUNTER — Ambulatory Visit: Payer: BLUE CROSS/BLUE SHIELD | Admitting: Family Medicine

## 2018-11-12 VITALS — BP 100/60 | HR 83 | Temp 98.1°F | Resp 16 | Ht 63.5 in | Wt 161.0 lb

## 2018-11-12 DIAGNOSIS — M545 Low back pain, unspecified: Secondary | ICD-10-CM

## 2018-11-12 DIAGNOSIS — G8929 Other chronic pain: Secondary | ICD-10-CM

## 2018-11-12 DIAGNOSIS — M5412 Radiculopathy, cervical region: Secondary | ICD-10-CM

## 2018-11-12 DIAGNOSIS — F320 Major depressive disorder, single episode, mild: Secondary | ICD-10-CM

## 2018-11-12 DIAGNOSIS — M546 Pain in thoracic spine: Secondary | ICD-10-CM

## 2018-11-12 MED ORDER — DULOXETINE HCL 60 MG PO CPEP: 60.0000 mg | ORAL_CAPSULE | Freq: Every day | ORAL | 3 refills | Status: DC

## 2018-11-12 MED ORDER — DULOXETINE HCL 30 MG PO CPEP: 30.0000 mg | ORAL_CAPSULE | Freq: Every day | ORAL | 0 refills | Status: DC

## 2018-11-12 NOTE — Progress Notes (Signed)
Subjective:    Patient ID: Christine ShiversAnita L Hollinger, female    DOB: Jun 07, 1976, 43 y.o.   MRN: [id number]  HPI   Patient presents to clinic for follow-up on chronic pain.  States overall her neck does feel better since getting the injections.  Continues to have pain in her mid and low back.  She does have upcoming appointment with pain management in February and is hopeful she will be able to get different injections, states she had injections in her mid/low back years ago and they were quite effective for some time.  Patient states she has been feeling down and depressed lately.  Patient does think it is somewhat related to her chronic pain and not being able to do much at work, feels like she is not as useful of an employee as she used to be.  She is happy to say that her employer has been very helpful with her medical issues, and has been compliant with all her restrictions.  Denies any SI or HI.  Patient Active Problem List   Diagnosis Date Noted  . Cervical radiculopathy 08/10/2018  . Chronic bilateral low back pain 08/10/2018  . Chronic bilateral thoracic back pain 08/10/2018  . Family history of metabolic and nutritional disorder 08/10/2018  . Fatigue 08/10/2018  . Post-operative state 07/07/2016  . Cholelithiasis 04/10/2015   Social History   Tobacco Use  . Smoking status: Former Smoker    Last attempt to quit: 04/09/1997    Years since quitting: 21.6  . Smokeless tobacco: Never Used  Substance Use Topics  . Alcohol use: No   Review of Systems  Constitutional: Negative for chills, fatigue and fever.  HENT: Negative for congestion, ear pain, sinus pain and sore throat.   Eyes: Negative.   Respiratory: Negative for cough, shortness of breath and wheezing.   Cardiovascular: Negative for chest pain, palpitations and leg swelling.  Gastrointestinal: Negative for abdominal pain, diarrhea, nausea and vomiting.  Genitourinary: Negative for dysuria, frequency and urgency.    Musculoskeletal: Chronic neck, low and mid back pain.  Skin: Negative for color change, pallor and rash.  Neurological: Negative for syncope, light-headedness and headaches.  Psychiatric/Behavioral: Feeling down     Objective:   Physical Exam Vitals signs and nursing note reviewed.  Constitutional:      General: She is not in acute distress.    Appearance: She is not toxic-appearing.  HENT:     Head: Normocephalic and atraumatic.  Eyes:     General: No scleral icterus.    Extraocular Movements: Extraocular movements intact.     Conjunctiva/sclera: Conjunctivae normal.  Cardiovascular:     Rate and Rhythm: Normal rate and regular rhythm.  Pulmonary:     Effort: Pulmonary effort is normal.     Breath sounds: Normal breath sounds.  Musculoskeletal:     Comments: Stiff in her movements due to chronic neck, mid and low back pain.   Neurological:     Mental Status: She is alert and oriented to person, place, and time.  Psychiatric:        Mood and Affect: Mood normal.        Behavior: Behavior normal.        Thought Content: Thought content normal.        Judgment: Judgment normal.    Dep[facility] 11/12/2018 08/10/2018  Decreased Interest 1 0  Down, Depressed, Hopeless 1 0  PHQ - 2 Score 2 0  Altered sleeping 0 -  Tired,  decreased energy 1 -  Change in appetite 1 -  Feeling bad or failure about yourself  1 -  Trouble concentrating 1 -  Moving slowly or fidgety/restless 1 -  Suicidal thoughts 0 -  PHQ-9 Score 7 -  Difficult doing work/chores Somewhat difficult -      Assessment & Plan:   Major depressive disorder-patient will begin Cymbalta 30 mg once per day for 1 week, then increase dose to 60 mg/day.  Chronic neck, mid and low back pain- patient will keep pain clinic appointment as planned.  Cymbalta could potentially also help with her chronic pain, so I am hopeful that this medication gives her both relief in regards to her mood and pain.  Patient  will follow-up in approximately 4 weeks for recheck I am mood after adding Cymbalta.

## 2018-12-10 ENCOUNTER — Ambulatory Visit: Payer: BLUE CROSS/BLUE SHIELD | Admitting: Family Medicine

## 2018-12-10 ENCOUNTER — Encounter: Payer: Self-pay | Admitting: Family Medicine

## 2018-12-10 VITALS — BP 98/68 | HR 78 | Temp 98.4°F | Resp 16 | Ht 63.0 in | Wt 156.6 lb

## 2018-12-10 DIAGNOSIS — M546 Pain in thoracic spine: Secondary | ICD-10-CM

## 2018-12-10 DIAGNOSIS — M545 Low back pain: Secondary | ICD-10-CM

## 2018-12-10 DIAGNOSIS — L84 Corns and callosities: Secondary | ICD-10-CM | POA: Diagnosis not present

## 2018-12-10 DIAGNOSIS — G8929 Other chronic pain: Secondary | ICD-10-CM

## 2018-12-10 DIAGNOSIS — M5412 Radiculopathy, cervical region: Secondary | ICD-10-CM | POA: Diagnosis not present

## 2018-12-10 DIAGNOSIS — F320 Major depressive disorder, single episode, mild: Secondary | ICD-10-CM | POA: Diagnosis not present

## 2018-12-10 MED ORDER — DULOXETINE HCL 60 MG PO CPEP: 60.0000 mg | ORAL_CAPSULE | Freq: Every day | ORAL | 1 refills | Status: DC

## 2018-12-10 MED ORDER — SALICYLIC ACID 6 % EX GEL: Freq: Every day | CUTANEOUS | 0 refills | Status: DC

## 2018-12-10 NOTE — Patient Instructions (Signed)

## 2018-12-10 NOTE — Progress Notes (Signed)
Subjective:    Patient ID: Christine Simmons, female    DOB: [birth year], 43 y.o.   MRN: [id number]  HPI   Patient presents to clinic for follow-up on mood after starting Cymbalta and also her chronic neck mid and low back pain.  I was hoping that Cymbalta would help both improve her mood and reduce some of her chronic pain.  Patient states since starting the Cymbalta she is feeling happier and is tolerating the medication without any adverse side effects.  Also states her neck mid and low back pain do seem somewhat improved with the Cymbalta.  She continues to take it easy at work, no lifting greater than 10 pounds; she is learning more and more to know her limit with lifitng.  Also trying to do stretches daily to help keep her muscles and joints from becoming too stiff.  Denies any SI or HI.  Patient also states there is a sore area on her left fourth toe.  Patient Active Problem List   Diagnosis Date Noted  . Cervical radiculopathy 08/10/2018  . Chronic bilateral low back pain 08/10/2018  . Chronic bilateral thoracic back pain 08/10/2018  . Family history of metabolic and nutritional disorder 08/10/2018  . Fatigue 08/10/2018  . Post-operative state 07/07/2016  . Cholelithiasis 04/10/2015   Social History   Tobacco Use  . Smoking status: Former Smoker    Last attempt to quit: 04/09/1997    Years since quitting: 21.6  . Smokeless tobacco: Never Used  Substance Use Topics  . Alcohol use: No    Review of Systems  Constitutional: Negative for chills, fatigue and fever.  HENT: Negative for congestion, ear pain, sinus pain and sore throat.   Eyes: Negative.   Respiratory: Negative for cough, shortness of breath and wheezing.   Cardiovascular: Negative for chest pain, palpitations and leg swelling.  Gastrointestinal: Negative for abdominal pain, diarrhea, nausea and vomiting.  Genitourinary: Negative for dysuria, frequency and urgency.  Musculoskeletal:Chronic neck, mid and low back  pain Skin: Negative for color change, pallor and rash. +sore area on toe, left  Neurological: Negative for syncope, light-headedness and headaches.  Psychiatric/Behavioral: Mood improved       Objective:   Physical Exam Vitals signs and nursing note reviewed.  Constitutional:      Appearance: She is well-developed.  HENT:     Head: Normocephalic and atraumatic.     Right Ear: External ear normal.     Left Ear: External ear normal.     Nose: Nose normal.  Eyes:     General: No scleral icterus.       Right eye: No discharge.        Left eye: No discharge.     Conjunctiva/sclera: Conjunctivae normal.     Pupils: Pupils are equal, round, and reactive to light.  Neck:     Musculoskeletal: Neck supple.     Vascular: No carotid bruit.     Trachea: No tracheal deviation.  Cardiovascular:     Rate and Rhythm: Normal rate and regular rhythm.     Heart sounds: Normal heart sounds.  Pulmonary:     Effort: Pulmonary effort is normal. No respiratory distress.     Breath sounds: Normal breath sounds. No rales.  Musculoskeletal: Normal range of motion.       Feet:     Comments: +pain in neck, thoracic spine and lumbar spine. Stiff with ROM, but pain is better.   Feet:     Right foot:  Skin integrity: No ulcer, blister, skin breakdown, erythema, warmth, callus, dry skin or fissure.     Toenail Condition: Right toenails are normal.     Left foot:     Skin integrity: Callus present. No ulcer, blister, skin breakdown, erythema, warmth, dry skin or fissure.     Toenail Condition: Left toenails are normal.     Comments: Small corn indicated by red mark on left fourth toe on diagram. Lymphadenopathy:     Cervical: No cervical adenopathy.  Skin:    General: Skin is warm and dry.     Capillary Refill: Capillary refill takes less than 2 seconds.     Coloration: Skin is not pale.     Findings: No erythema.  Neurological:     Mental Status: She is alert and oriented to person, place, and  time.     Gait: Gait normal.  Psychiatric:        Mood and Affect: Mood normal.        Behavior: Behavior normal.        Thought Content: Thought content normal.    Vitals:   12/10/18 0809  BP: 98/68  Pulse: 78  Resp: 16  Temp: 98.4 F (36.9 C)  SpO2: 98%      Assessment & Plan:   Depression - she will continue Cymbalta 60 mg.  Tolerating this well.  Chronic neck, thoracic and lumbar spine pain - she also will continue Cymbalta to help her pain.  She will continue not lifting more than 10 pounds while at work and doing regular stretching to keep joints from becoming too stiff.  Encourage patient to continue to work on knowing her limits when it comes to lifting or doing physical work.  Corn on left foot - patient will apply succinic acid gel to help corn dissolve.  Also suggested she wear a corn pad on that toe to help it from rubbing on the toe next to it.  Encouraged her to take shoes and socks off when she gets home from work to allow skin on feet time to air out.  She will follow-up in 3 months for recheck on chronic medical conditions.  She will return to clinic sooner if any issues arise.

## 2018-12-16 ENCOUNTER — Ambulatory Visit: Payer: BLUE CROSS/BLUE SHIELD | Admitting: Student in an Organized Health Care Education/Training Program

## 2018-12-31 ENCOUNTER — Ambulatory Visit: Payer: BLUE CROSS/BLUE SHIELD | Admitting: Family Medicine

## 2018-12-31 ENCOUNTER — Encounter: Payer: Self-pay | Admitting: Family Medicine

## 2018-12-31 ENCOUNTER — Ambulatory Visit (INDEPENDENT_AMBULATORY_CARE_PROVIDER_SITE_OTHER): Payer: BLUE CROSS/BLUE SHIELD

## 2018-12-31 VITALS — BP 110/80 | HR 100 | Temp 98.4°F | Ht 63.0 in | Wt 156.8 lb

## 2018-12-31 DIAGNOSIS — M25511 Pain in right shoulder: Secondary | ICD-10-CM

## 2018-12-31 DIAGNOSIS — L84 Corns and callosities: Secondary | ICD-10-CM

## 2018-12-31 DIAGNOSIS — M79672 Pain in left foot: Secondary | ICD-10-CM | POA: Diagnosis not present

## 2018-12-31 DIAGNOSIS — M546 Pain in thoracic spine: Secondary | ICD-10-CM

## 2018-12-31 DIAGNOSIS — G8929 Other chronic pain: Secondary | ICD-10-CM

## 2018-12-31 DIAGNOSIS — M5412 Radiculopathy, cervical region: Secondary | ICD-10-CM

## 2018-12-31 IMAGING — DX DG SHOULDER 2+V*R*
3 series · 3 of 3 positions shown · non-contrast
Comparison: None.

CLINICAL DATA: Right shoulder pain for a week. No injury or trauma.

EXAM:
RIGHT SHOULDER - 2+ VIEW

[shoulder (grashey) ap]
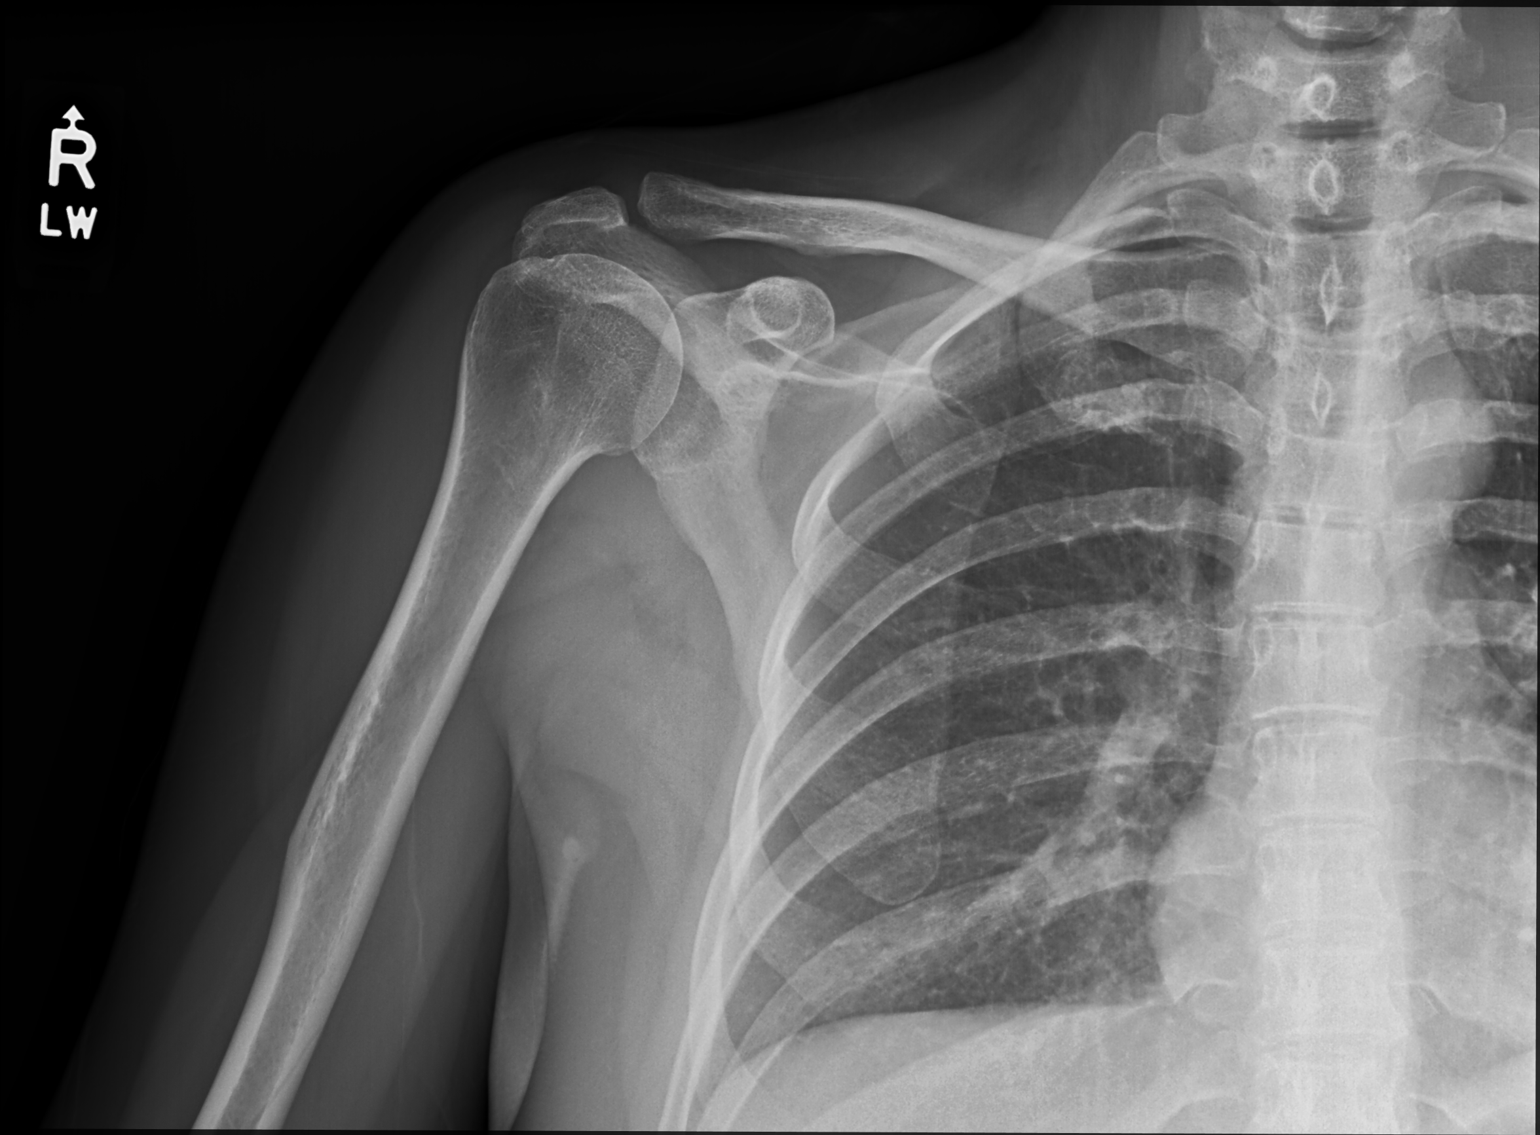

[shoulder y view]
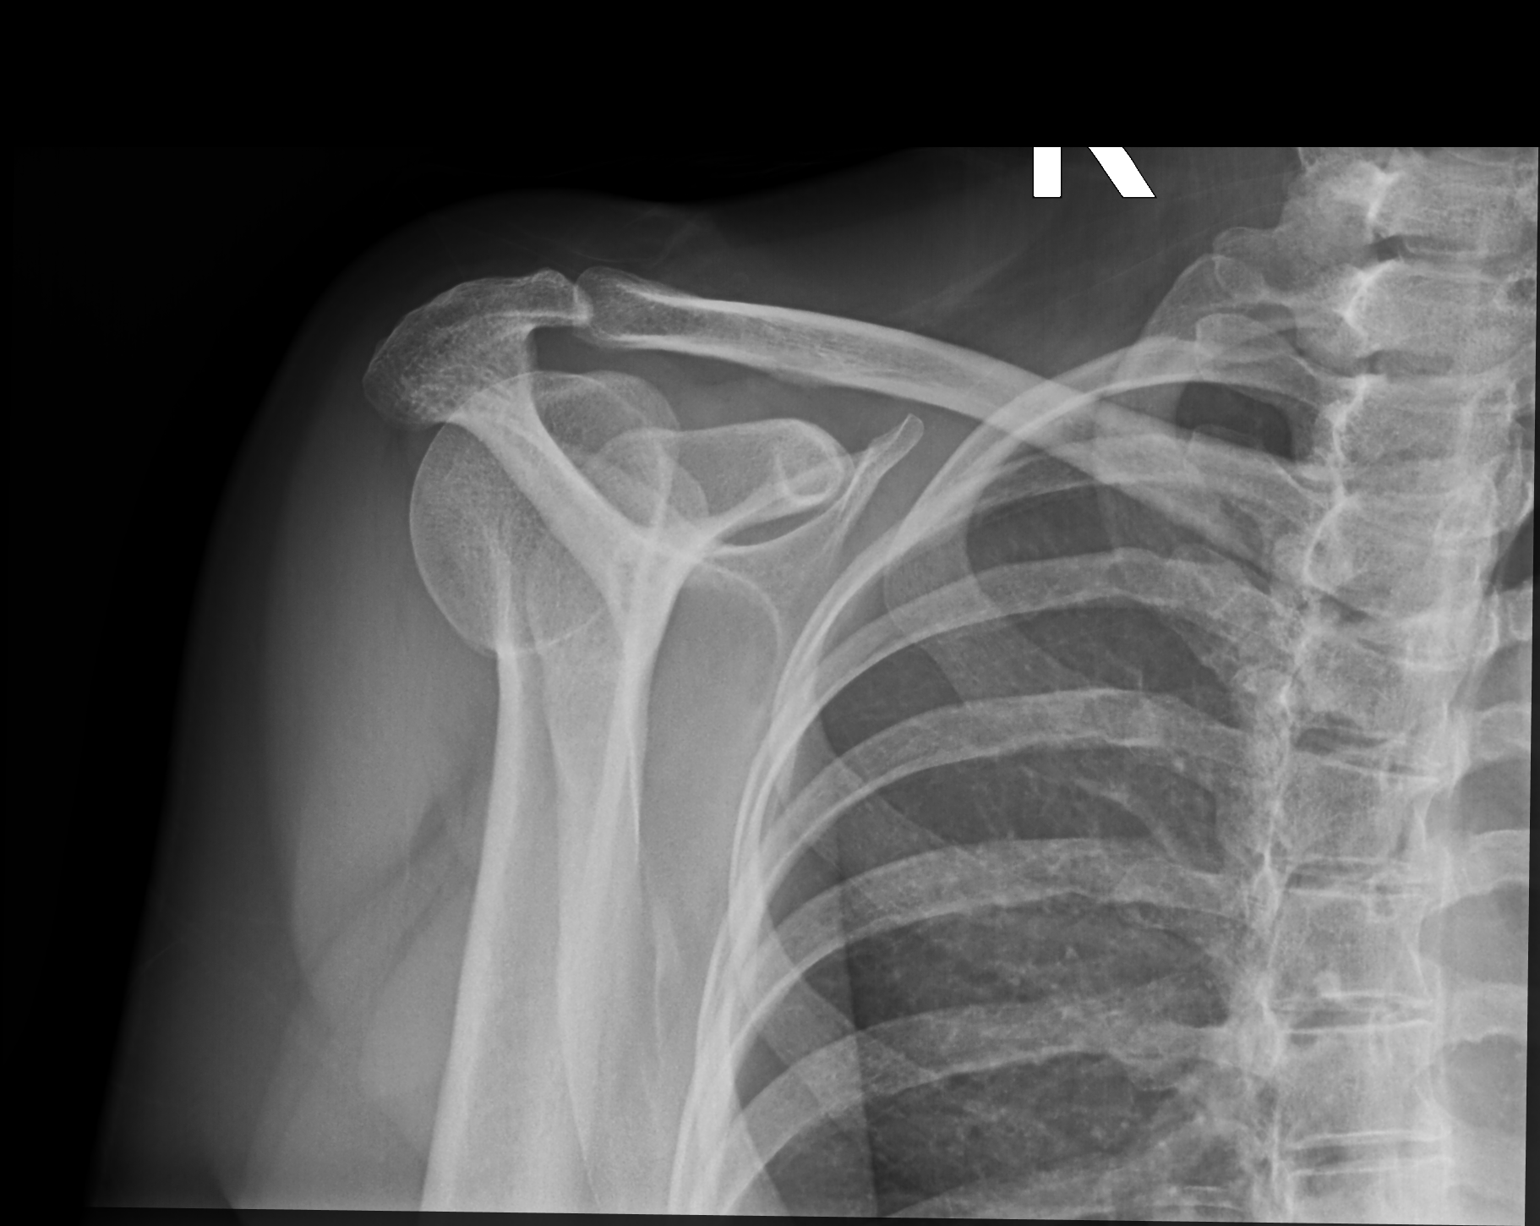

[shoulder axillary pa]
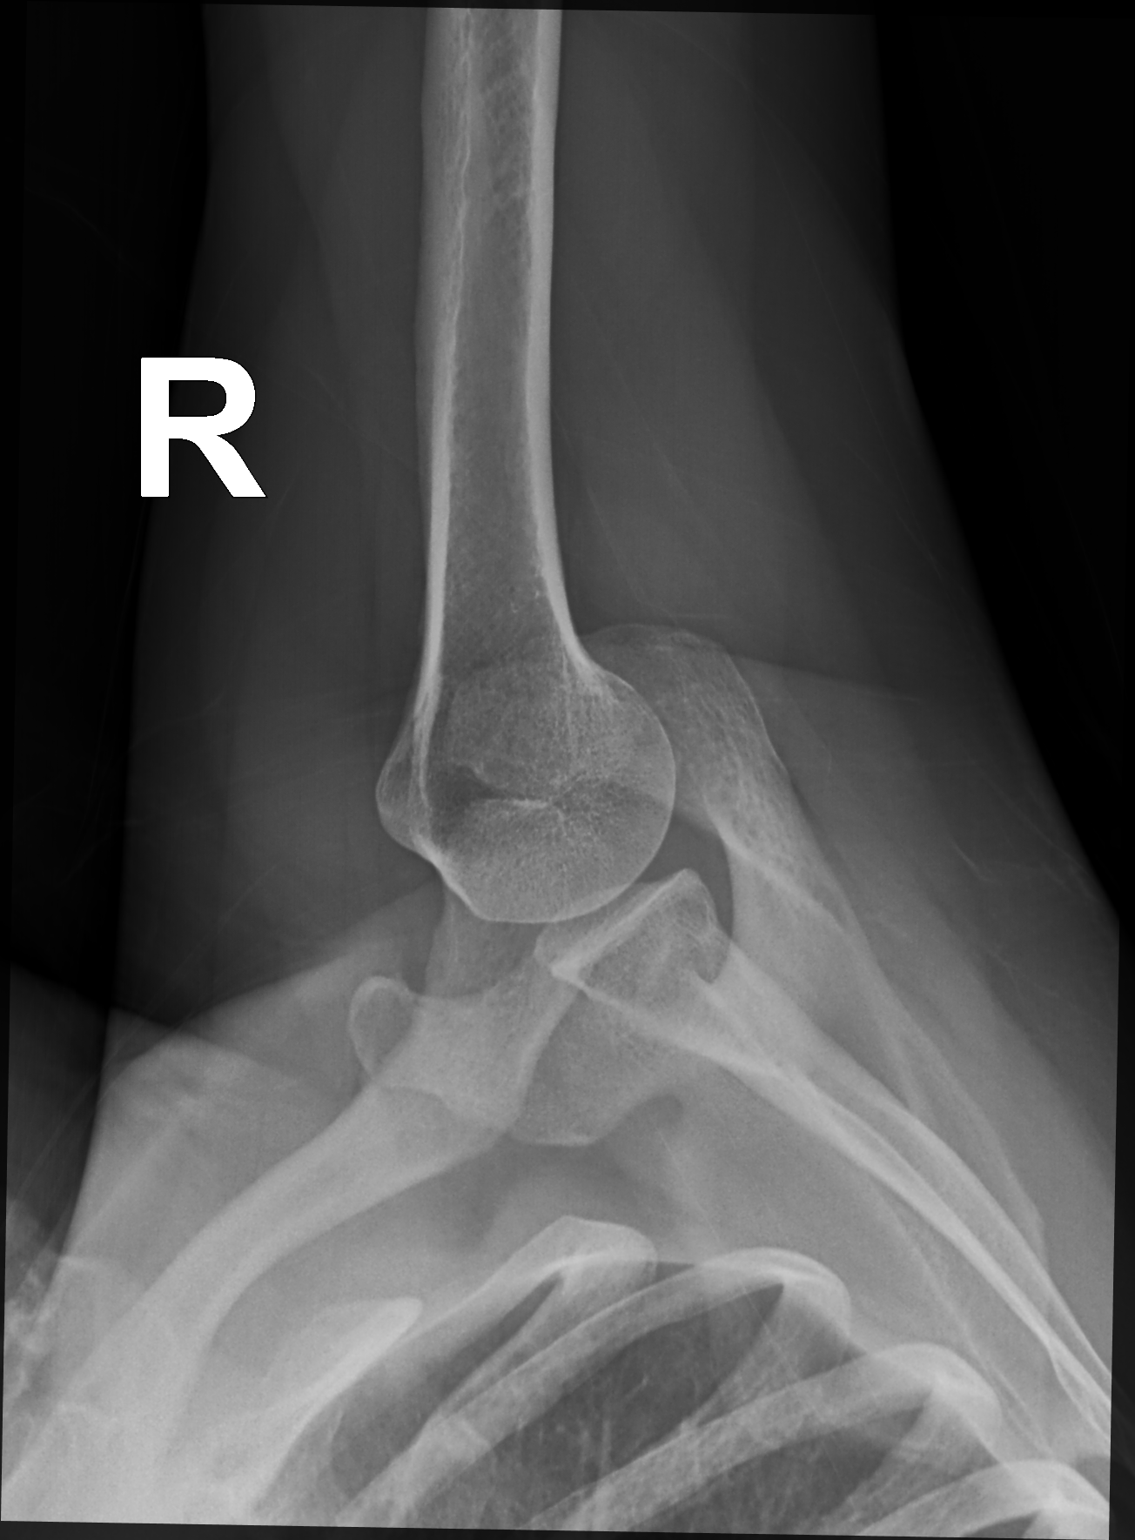

[3 of 3 positions shown; findings below may reference images not displayed]

FINDINGS: There is no evidence of fracture or dislocation. There is no
evidence of arthropathy or other focal bone abnormality. Soft
tissues are unremarkable.
IMPRESSION: Negative.

## 2018-12-31 IMAGING — DX DG FOOT COMPLETE 3+V*L*
3 series · 3 of 3 positions shown · non-contrast
Comparison: None.

CLINICAL DATA: Intermittent foot pain.

EXAM:
LEFT FOOT - COMPLETE 3+ VIEW

[foot ap]
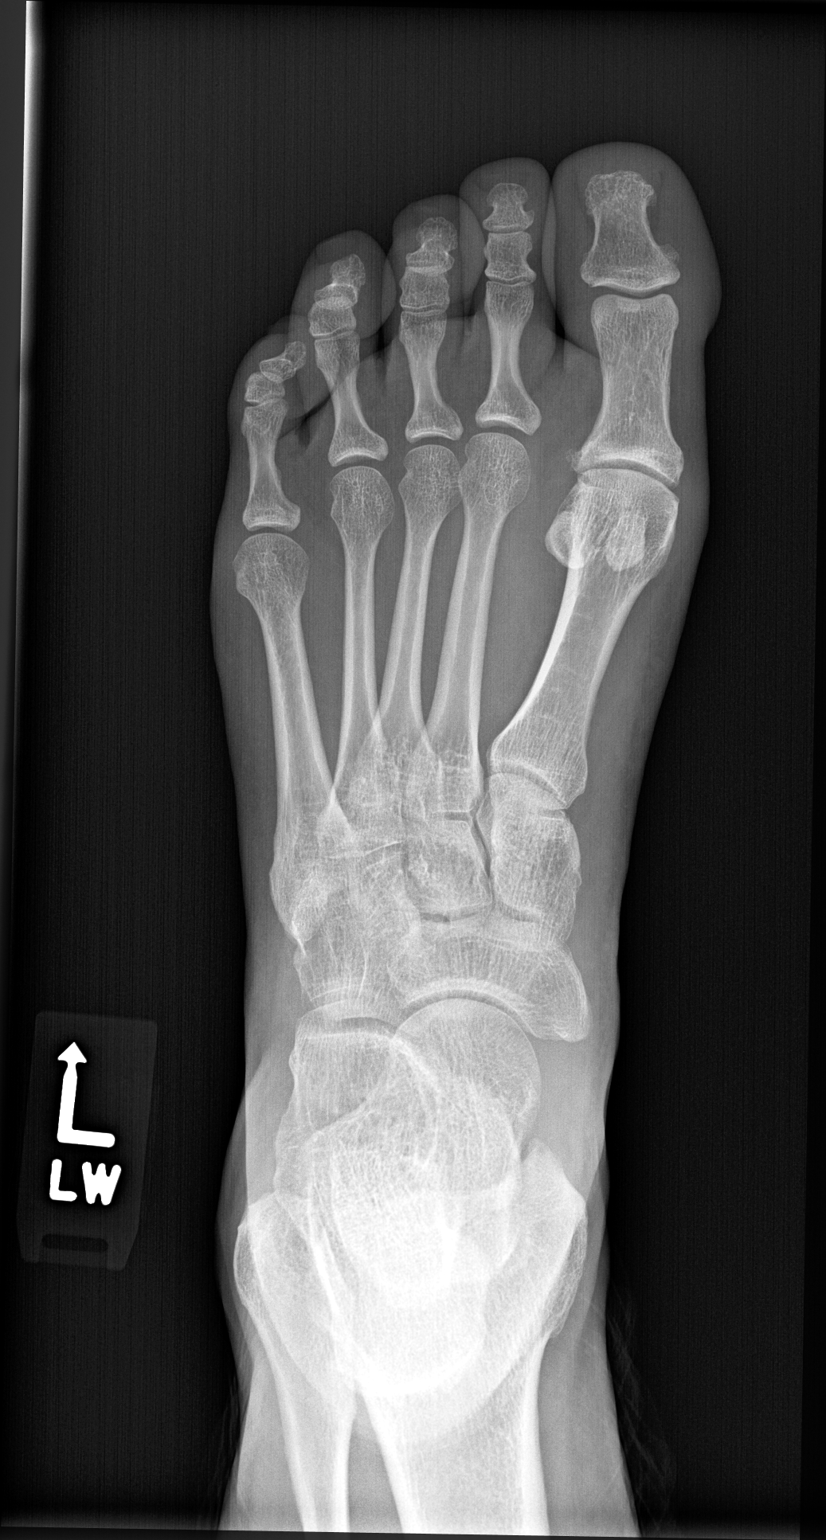

[foot obl (oblique)]
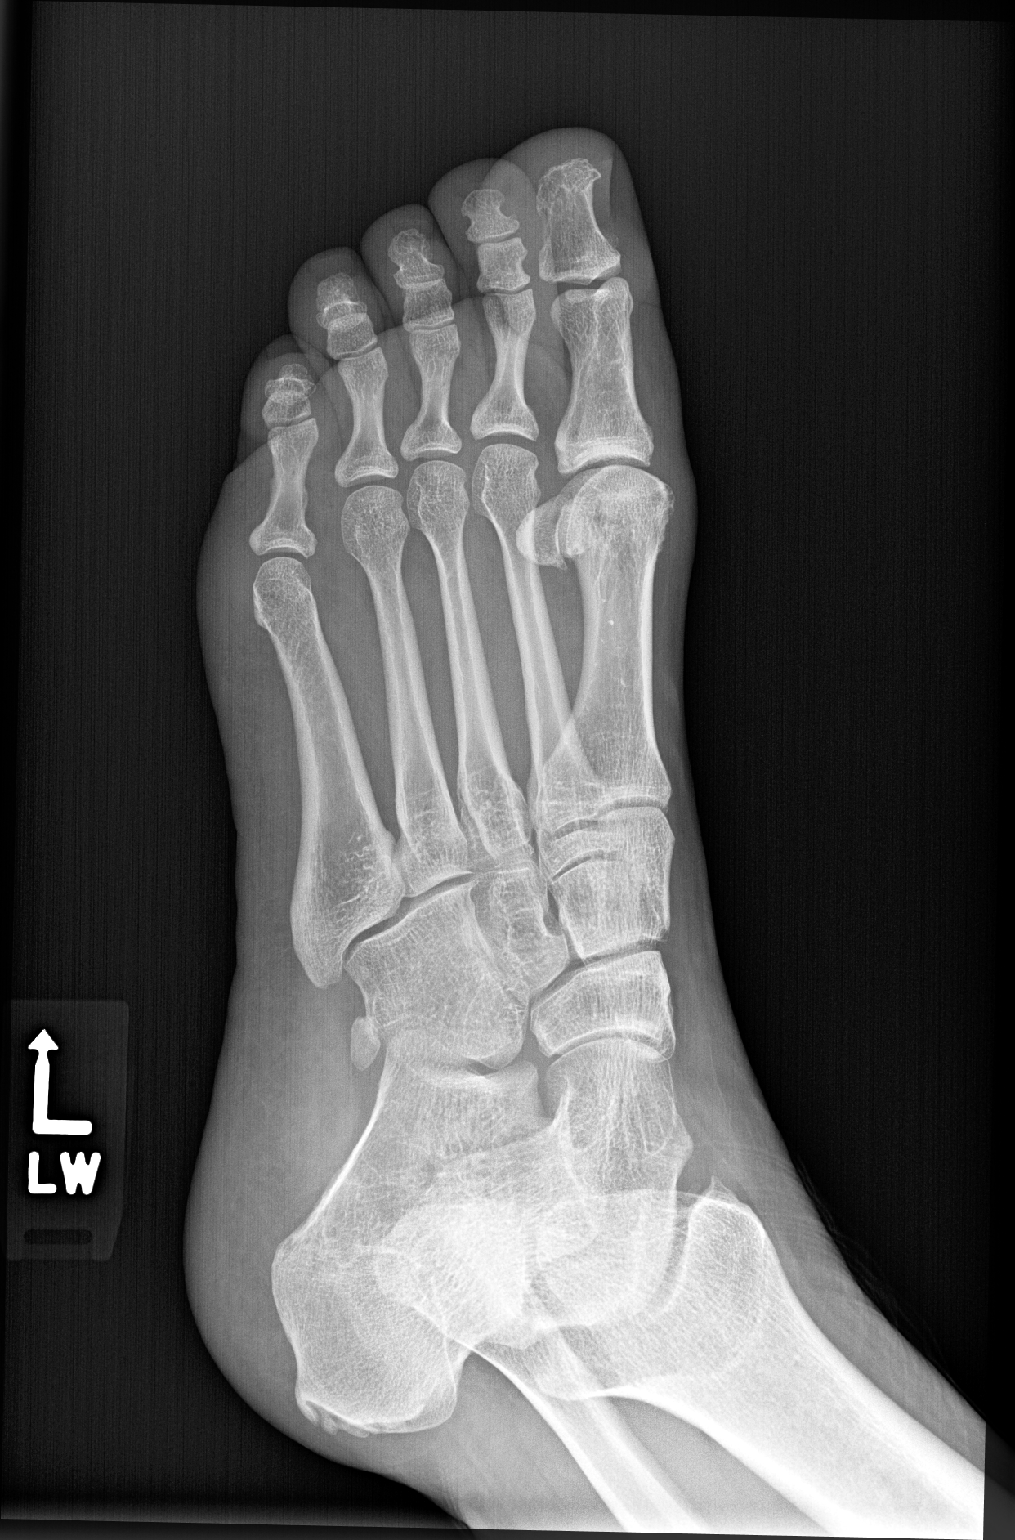

[foot lat]
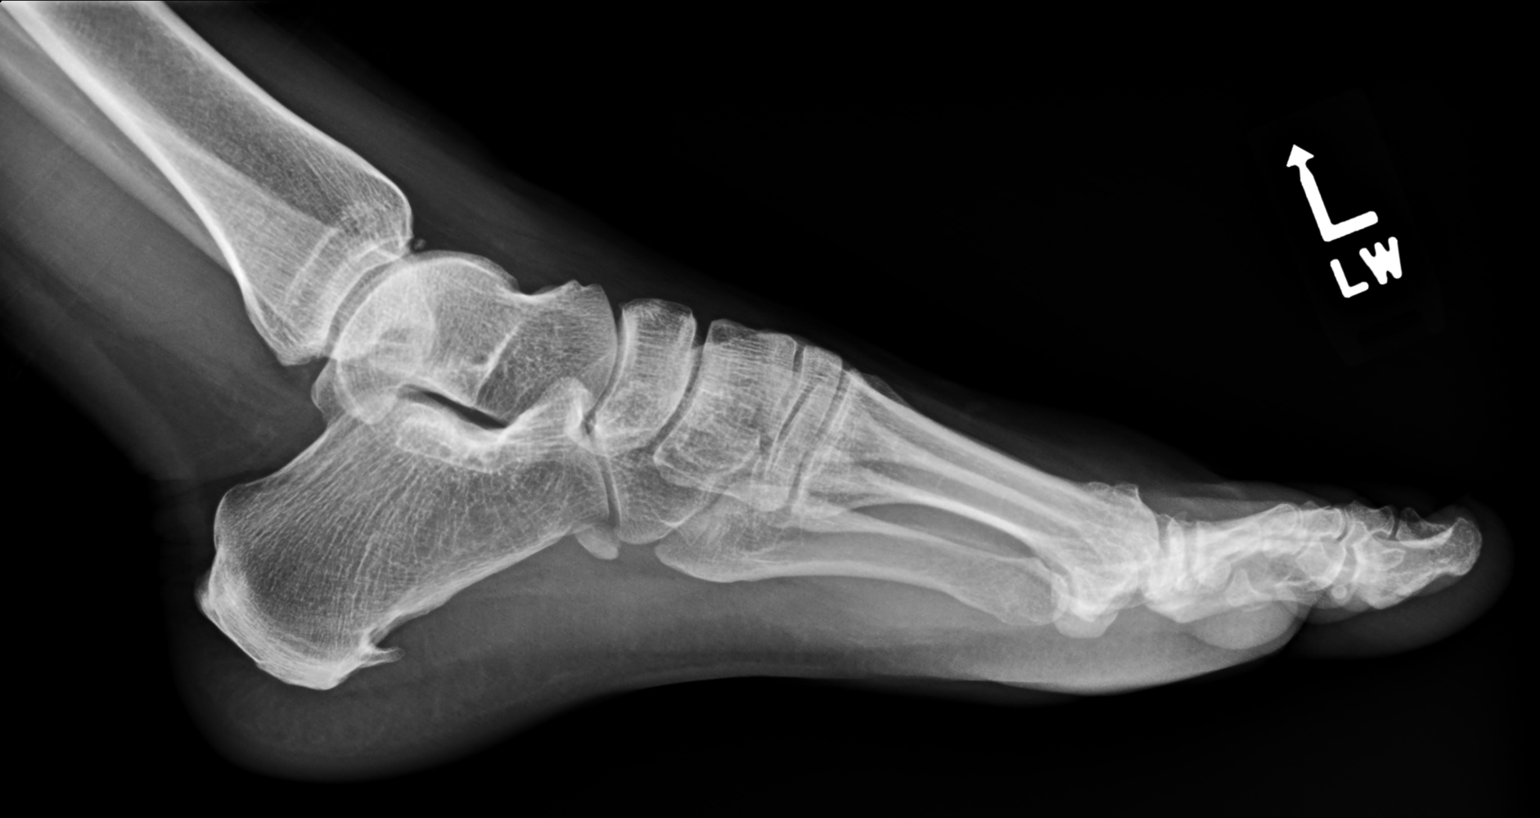

[3 of 3 positions shown; findings below may reference images not displayed]

FINDINGS: There is no evidence of fracture or dislocation. There is no
evidence of arthropathy or other focal bone abnormality. Soft
tissues are unremarkable.
IMPRESSION: Negative.

## 2018-12-31 MED ORDER — SALICYLIC ACID 26 % EX SOLN: CUTANEOUS | 0 refills | Status: DC

## 2018-12-31 MED ORDER — KETOROLAC TROMETHAMINE 60 MG/2ML IM SOLN
60.0000 mg | Freq: Once | INTRAMUSCULAR | Status: AC
  Administered 2018-12-31: 60 mg via INTRAMUSCULAR

## 2018-12-31 MED ORDER — METHYLPREDNISOLONE ACETATE 40 MG/ML IJ SUSP
40.0000 mg | Freq: Once | INTRAMUSCULAR | Status: AC
  Administered 2018-12-31: 40 mg via INTRAMUSCULAR

## 2018-12-31 MED ORDER — DICLOFENAC SODIUM 75 MG PO TBEC: 75.0000 mg | DELAYED_RELEASE_TABLET | Freq: Two times a day (BID) | ORAL | 0 refills | Status: DC

## 2018-12-31 NOTE — Progress Notes (Signed)
Subjective:    Patient ID: Christine Simmons, female    DOB: 1976-01-24, 43 y.o.   MRN: [id number]  HPI   Patient presents to clinic complaining of pain in right shoulder, and pain in left great toe.  Denies any known right shoulder injury, does have history of thoracic spine pain, but it is never radiated to her shoulder before.  Denies any known injury to left great toe.  States it hurts at the base of the left great toenail.  Also continues to have a corn on her left fourth toe, was unable to get the Siliq acid gel prescribed at last visit due to not being covered by insurance.  Patient Active Problem List   Diagnosis Date Noted  . Cervical radiculopathy 08/10/2018  . Chronic bilateral low back pain 08/10/2018  . Chronic bilateral thoracic back pain 08/10/2018  . Family history of metabolic and nutritional disorder 08/10/2018  . Fatigue 08/10/2018  . Post-operative state 07/07/2016  . Cholelithiasis 04/10/2015   Social History   Tobacco Use  . Smoking status: Former Smoker    Last attempt to quit: 04/09/1997    Years since quitting: 21.7  . Smokeless tobacco: Never Used  Substance Use Topics  . Alcohol use: No   Review of Systems  Constitutional: Negative for chills, fatigue and fever.  HENT: Negative for congestion, ear pain, sinus pain and sore throat.   Eyes: Negative.   Respiratory: Negative for cough, shortness of breath and wheezing.   Cardiovascular: Negative for chest pain, palpitations and leg swelling.  Gastrointestinal: Negative for abdominal pain, diarrhea, nausea and vomiting.  Genitourinary: Negative for dysuria, frequency and urgency.  Musculoskeletal: +right shoulder pain. +left great toe pain.  Skin: Negative for color change, pallor and rash.  Neurological: Negative for syncope, light-headedness and headaches.  Psychiatric/Behavioral: The patient is not nervous/anxious.       Objective:   Physical Exam Vitals signs and nursing note reviewed.    Constitutional:      General: She is not in acute distress.    Appearance: She is not toxic-appearing.  HENT:     Head: Normocephalic and atraumatic.  Neck:     Musculoskeletal: Neck supple.  Cardiovascular:     Rate and Rhythm: Normal rate and regular rhythm.     Pulses:          Dorsalis pedis pulses are 2+ on the right side and 2+ on the left side.       Posterior tibial pulses are 2+ on the right side and 2+ on the left side.  Musculoskeletal:     Right shoulder: She exhibits tenderness.       Feet:     Comments: Tenderness with palpation of right AC joint.  Pain with raising right arm straight up above head and reaching right arm straight back.  Is able to hold out both arms in front of her body, and resist me pushing down.  Grips equal and strong.  Pain when reaching right arm across body to grab left shoulder.  Feet:     Comments: Location of left great toe tenderness indicated by red line on diagram.  Toenail appears intact, no obvious gout seen.  Location of corn on left fourth toe indicated by blue mark on diagram. Lymphadenopathy:     Cervical: No cervical adenopathy.  Neurological:     Mental Status: She is alert and oriented to person, place, and time.     Gait: Gait normal.  Comments: Patient does not limp with walking.  Psychiatric:        Mood and Affect: Mood normal.        Behavior: Behavior normal.     Vitals:   12/31/18 1400  BP: 110/80  Pulse: 100  Temp: 98.4 F (36.9 C)  SpO2: 97%      Assessment & Plan:   Right shoulder pain-unclear reason for pain.  I wonder if this could be related to her chronic cervical and thoracic spine pain.  We will get x-ray of shoulder joint to further investigate.  Patient will continue to use Flexeril if needed for any muscle spasms, prescribed diclofenac to use if needed for joint pain.  Recommended doing gentle range of motion and stretching exercises.  Left foot pain-we will get x-ray of left foot to further  investigate pain.  Advised she can use the diclofenac for the pain in left foot as well.  Discussed wearing supportive shoes.  If x-rays are unremarkable, and pain persist we can consider referral to podiatry  Corn on left fourth toe- silique acid liquid scented in place of gel, she will use this to dissolve corn.  Also suggested use of corn pads to stop skin on skin rubbing.  Once we have x-ray results, we will better able to know next step in plan of care in regards to need for physical therapy or referrals to specialty.

## 2019-01-03 ENCOUNTER — Encounter: Payer: Self-pay | Admitting: Family Medicine

## 2019-01-05 ENCOUNTER — Telehealth: Payer: BLUE CROSS/BLUE SHIELD | Admitting: Nurse Practitioner

## 2019-01-05 DIAGNOSIS — K529 Noninfective gastroenteritis and colitis, unspecified: Secondary | ICD-10-CM

## 2019-01-05 MED ORDER — ONDANSETRON HCL 4 MG PO TABS: 4.0000 mg | ORAL_TABLET | Freq: Three times a day (TID) | ORAL | 0 refills | Status: DC | PRN

## 2019-01-05 NOTE — Progress Notes (Signed)
We are sorry that you are not feeling well.  Here is how we plan to help!  Based on what you have shared with me it looks like you have Acute Infectious Diarrhea.  Most cases of acute diarrhea are due to infections with virus and bacteria and are self-limited conditions lasting less than 14 days.  For your symptoms you may take Imodium 2 mg tablets that are over the counter at your local pharmacy. Take two tablet now and then one after each loose stool up to 6 a day.  Antibiotics are not needed for most people with diarrhea.  Optional: Zofran 4 mg 1 tablet every 8 hours as needed for nausea and vomiting   HOME CARE  We recommend changing your diet to help with your symptoms for the next few days.  Drink plenty of fluids that contain water salt and sugar. Sports drinks such as Gatorade may help.   You may try broths, soups, bananas, applesauce, soft breads, mashed potatoes or crackers.   You are considered infectious for as long as the diarrhea continues. Hand washing or use of alcohol based hand sanitizers is recommend.  It is best to stay out of work or school until your symptoms stop.   GET HELP RIGHT AWAY  If you have dark yellow colored urine or do not pass urine frequently you should drink more fluids.    If your symptoms worsen   If you feel like you are going to pass out (faint)  You have a new problem  MAKE SURE YOU   Understand these instructions.  Will watch your condition.  Will get help right away if you are not doing well or get worse.  Your e-visit answers were reviewed by a board certified advanced clinical practitioner to complete your personal care plan.  Depending on the condition, your plan could have included both over the counter or prescription medications.  If there is a problem please reply  once you have received a response from your provider.  Your safety is important to [place].  If you have drug allergies check your prescription carefully.    You  can use MyChart to ask questions about today's visit, request a non-urgent call back, or ask for a work or school excuse for 24 hours related to this e-Visit. If it has been greater than 24 hours you will need to follow up with your provider, or enter a new e-Visit to address those concerns.   You will get an e-mail in the next two days asking about your experience.  I hope that your e-visit has been valuable and will speed your recovery. Thank you for using e-visits.  5 minutes spent reviewing and documenting in chart.

## 2019-01-24 ENCOUNTER — Encounter: Payer: Self-pay | Admitting: Family Medicine

## 2019-03-02 ENCOUNTER — Encounter: Payer: Self-pay | Admitting: Family Medicine

## 2019-03-02 DIAGNOSIS — M79672 Pain in left foot: Secondary | ICD-10-CM

## 2019-03-03 NOTE — Telephone Encounter (Signed)
Christine Simmons,  I put in podiatry referral for her today.  Looks like her pain management referral from a few months ago is closed -- patient was asking me about status  Thanks!  LG

## 2019-03-11 ENCOUNTER — Ambulatory Visit: Payer: BLUE CROSS/BLUE SHIELD | Admitting: Family Medicine

## 2019-03-17 ENCOUNTER — Encounter: Payer: Self-pay | Admitting: Family Medicine

## 2019-03-17 DIAGNOSIS — G8929 Other chronic pain: Secondary | ICD-10-CM

## 2019-03-17 DIAGNOSIS — F320 Major depressive disorder, single episode, mild: Secondary | ICD-10-CM

## 2019-03-17 DIAGNOSIS — M5412 Radiculopathy, cervical region: Secondary | ICD-10-CM

## 2019-03-17 DIAGNOSIS — M546 Pain in thoracic spine: Secondary | ICD-10-CM

## 2019-03-17 DIAGNOSIS — M545 Low back pain, unspecified: Secondary | ICD-10-CM

## 2019-03-17 MED ORDER — DULOXETINE HCL 30 MG PO CPEP: 30.0000 mg | ORAL_CAPSULE | Freq: Every day | ORAL | 1 refills | Status: DC

## 2019-03-17 NOTE — Telephone Encounter (Signed)
Patient states she has not heard back from pain clinic in regard to referral  Thanks!  LG

## 2019-03-18 NOTE — Telephone Encounter (Signed)
°      WOOD, Christine Simmons  Note Text:  Patient has been 2 no show and 1 reschedule / do not reschedule with our clinic on this referral.    It looks like since she no showed twice and rescheduled once [facility] will not reschedule her. I will have to send her somewhere in [place]. I will send her a mychart message regarding this. [name]

## 2019-03-29 ENCOUNTER — Encounter: Payer: Self-pay | Admitting: Family Medicine

## 2019-03-30 NOTE — Telephone Encounter (Signed)
Called Pt No answer left a VM for Pt to call the office

## 2019-03-30 NOTE — Telephone Encounter (Signed)
Please call to see if she would do virtual appt due to hurting neck/back  Also ask if this was done at work, if done at work it is workers comp and not sure exactly how that is to be scheduled/billed  Thanks  LG

## 2019-03-31 ENCOUNTER — Other Ambulatory Visit: Payer: Self-pay

## 2019-03-31 ENCOUNTER — Encounter: Payer: Self-pay | Admitting: Family Medicine

## 2019-03-31 ENCOUNTER — Ambulatory Visit (INDEPENDENT_AMBULATORY_CARE_PROVIDER_SITE_OTHER): Payer: BC Managed Care – PPO | Admitting: Family Medicine

## 2019-03-31 DIAGNOSIS — M5412 Radiculopathy, cervical region: Secondary | ICD-10-CM | POA: Diagnosis not present

## 2019-03-31 DIAGNOSIS — M4804 Spinal stenosis, thoracic region: Secondary | ICD-10-CM | POA: Diagnosis not present

## 2019-03-31 DIAGNOSIS — N920 Excessive and frequent menstruation with regular cycle: Secondary | ICD-10-CM | POA: Insufficient documentation

## 2019-03-31 DIAGNOSIS — R102 Pelvic and perineal pain: Secondary | ICD-10-CM | POA: Insufficient documentation

## 2019-03-31 DIAGNOSIS — M545 Low back pain: Secondary | ICD-10-CM

## 2019-03-31 DIAGNOSIS — G8929 Other chronic pain: Secondary | ICD-10-CM

## 2019-03-31 DIAGNOSIS — M5030 Other cervical disc degeneration, unspecified cervical region: Secondary | ICD-10-CM | POA: Insufficient documentation

## 2019-03-31 MED ORDER — CELECOXIB 200 MG PO CAPS: 200.0000 mg | ORAL_CAPSULE | Freq: Two times a day (BID) | ORAL | 2 refills | Status: DC

## 2019-03-31 NOTE — Telephone Encounter (Signed)
Pt appt today at 10:00am

## 2019-03-31 NOTE — Progress Notes (Signed)
Patient ID: Christine Simmons, female   DOB: Dec 28, 1975, 43 y.o.   MRN: [id number]    Virtual Visit via video Note  This visit type was conducted due to national recommendations for restrictions regarding the COVID-19 pandemic (e.g. social distancing).  This format is felt to be most appropriate for this patient at this time.  All issues noted in this document were discussed and addressed.  No physical exam was performed (except for noted visual exam findings with Video Visits).   I connected with Christine Simmons today at 10:00 AM EDT by a video enabled telemedicine application and verified that I am speaking with the correct person using two identifiers. Location patient: home Location provider: [facility] Persons participating in the virtual visit: patient, provider  I discussed the limitations, risks, security and privacy concerns of performing an evaluation and management service by telephone and the availability of in person appointments. I also discussed with the patient that there may be a patient responsible charge related to this service. The patient expressed understanding and agreed to proceed.  HPI: Patient and I connected via video due to complaints of continued neck, thoracic and lumbar spine pain.  Patient has a long history of chronic neck and back pain.  She has seen physical medicine and did get injections which did help for a good period of time.  Patient reinjured self about a year ago when she jumped over a creek and that landed hard on the ground.  States ever since then her back has been "out of whack'.  At her job she does have to lift items repeatedly and lifting things seems to exacerbate pain.  Patient does have lifting and weight restrictions for her job.  Patient believes that she may have overdid either at work or at home and is cause herself to have more pain in neck, thoracic spine and lumbar spine.  We did increase her Cymbalta to 90 mg daily to see if this would  help offset some of her pain.  Patient states it does help somewhat, but she continues to ache and have pain every day.  Patient has taken gabapentin in the past but did not like how this medication made her feel and it did not do much for her pain so she stopped.  Denies fever or chills.  Denies numbness or tingling in extremities, states her arms and legs usually will ache but they do not feel numb or tingle at this time.  Denies issues with her grip or loss of bowel or bladder control.  Denies saddle anesthesia.   ROS: See pertinent positives and negatives per HPI.  Past Medical History:  Diagnosis Date  . Arthritis    low back and neck  . Back pain of lumbar region with sciatica   . Chronic kidney disease    kidney stones and infection  . Degenerative disc disease, cervical   . Family history of adverse reaction to anesthesia    "unable to put mother to sleep"  . Frequent headaches   . GERD (gastroesophageal reflux disease)   . Lichen   . Menorrhagia   . Ovarian cyst     Past Surgical History:  Procedure Laterality Date  . ABDOMINAL HYSTERECTOMY    . ANKLE SURGERY Left   . CESAREAN SECTION    . CHOLECYSTECTOMY N/A 04/10/2015   Procedure: LAPAROSCOPIC CHOLECYSTECTOMY WITH INTRAOPERATIVE CHOLANGIOGRAM;  Surgeon: Lattie Haw, MD;  Location: [facility] ORS;  Service: General;  Laterality: N/A;  . ESOPHAGOGASTRODUODENOSCOPY (EGD)  WITH PROPOFOL N/A 12/12/2016   Procedure: ESOPHAGOGASTRODUODENOSCOPY (EGD) WITH PROPOFOL;  Surgeon: Christena Deem, MD;  Location[facility] ENDOSCOPY;  Service: Endoscopy;  Laterality: N/A;  . GALLBLADDER SURGERY    . LAPAROSCOPIC VAGINAL HYSTERECTOMY WITH SALPINGECTOMY Bilateral 07/07/2016   Procedure: LAPAROSCOPIC ASSISTED VAGINAL HYSTERECTOMY WITH SALPINGECTOMY;  Surgeon: Suzy Bouchard, MD;  Location: [facility] ORS;  Service: Gynecology;  Laterality: Bilateral;    Family History  Problem Relation Age of Onset  . Hypertension Mother   . COPD Mother    . Arthritis Mother   . Gallbladder disease Maternal Grandmother   . Heart disease Maternal Grandmother   . Hyperlipidemia Maternal Grandmother   . Intellectual disability Maternal Grandmother   . Hypertension Maternal Grandmother   . Heart attack Maternal Grandmother   . Hypertension Father   . Alcohol abuse Maternal Grandfather   . Cancer Maternal Grandfather   . COPD Maternal Grandfather   . Autoimmune disease Paternal Aunt    Social History   Tobacco Use  . Smoking status: Former Smoker    Last attempt to quit: 04/09/1997    Years since quitting: 21.9  . Smokeless tobacco: Never Used  Substance Use Topics  . Alcohol use: No    Current Outpatient Medications:  .  cyclobenzaprine (FLEXERIL) 10 MG tablet, Take 10 mg by mouth 3 (three) times daily as needed for muscle spasms., Disp: , Rfl:  .  diclofenac (VOLTAREN) 75 MG EC tablet, Take 1 tablet (75 mg total) by mouth 2 (two) times daily., Disp: 30 tablet, Rfl: 0 .  DULoxetine (CYMBALTA) 30 MG capsule, Take 1 capsule (30 mg total) by mouth daily. Take with the 60 mg capsule to make 90 mg dose per day, Disp: 90 capsule, Rfl: 1 .  gabapentin (NEURONTIN) 300 MG capsule, Take 1 capsule (300 mg total) by mouth at bedtime., Disp: 30 capsule, Rfl: 3 .  Lidocaine (PAIN RELIEVING LIDOCAINE) 4 % PTCH, Apply 1 patch topically daily. Apply to painful area - neck and/or back, Disp: 12 patch, Rfl: 5 .  ondansetron (ZOFRAN) 4 MG tablet, Take 1 tablet (4 mg total) by mouth every 8 (eight) hours as needed for nausea or vomiting., Disp: 20 tablet, Rfl: 0 .  pantoprazole (PROTONIX) 40 MG tablet, Take 40 mg by mouth daily as needed. , Disp: , Rfl:  .  Salicylic Acid 26 % SOLN, Apply small amount daily to affected area until gone, Disp: 10 mL, Rfl: 0  EXAM:  GENERAL: alert, oriented, appears well and in no acute distress  HEENT: atraumatic, conjunttiva clear, no obvious abnormalities on inspection of external nose and ears  NECK: normal movements  of the head and neck  LUNGS: on inspection no signs of respiratory distress, breathing rate appears normal, no obvious gross SOB, gasping or wheezing  CV: no obvious cyanosis  MS: moves all visible extremities without noticeable abnormality  PSYCH/NEURO: pleasant and cooperative, no obvious depression or anxiety, speech and thought processing grossly intact  ASSESSMENT AND PLAN:  Discussed the following assessment and plan:  Cervical radiculopathy - Plan: MR Cervical Spine Wo Contrast, celecoxib (CELEBREX) 200 MG capsule  Chronic bilateral low back pain, unspecified whether sciatica present - Plan: MR Lumbar Spine Wo Contrast, celecoxib (CELEBREX) 200 MG capsule  Spinal stenosis of thoracic region - Plan: MR Thoracic Spine Wo Contrast  Due to patient's continued pain and the fact that she has not had imaging recently we will get MRIs of neck, thoracic and lumbar spine.  Patient also advised to  reach back out to physical medicine to see if she could come in and get a another injection as this was very beneficial to her in the past.  Referral to pain management was placed at the end of May and it is in process.  Patient will continue Cymbalta at the elevated dose of 90 mg to help overall control pain and we will also trial Celebrex twice daily with food to see if this helps improve pain.  Patient does not want to retry gabapentin at this time.  Also advised to be sure and adhere to her lifting and weight restrictions that are in place for her work & to also follow these lifting rules when at home so she does not reinjure herself.   I discussed the assessment and treatment plan with the patient. The patient was provided an opportunity to ask questions and all were answered. The patient agreed with the plan and demonstrated an understanding of the instructions.   The patient was advised to call back or seek an in-person evaluation if the symptoms worsen or if the condition fails to improve as  anticipated.  Patient will otherwise keep regular scheduled follow-ups as planned.  She is aware if she does not hear anything about pain management referral in the next 7 to 14 days to reach out to us and let us know.  Tracey HarriesLauren M Shyquan Stallbaumer, FNP

## 2019-04-01 ENCOUNTER — Ambulatory Visit: Payer: BLUE CROSS/BLUE SHIELD | Admitting: Podiatry

## 2019-04-01 ENCOUNTER — Telehealth: Payer: BC Managed Care – PPO | Admitting: Family

## 2019-04-01 ENCOUNTER — Encounter: Payer: Self-pay | Admitting: Podiatry

## 2019-04-01 ENCOUNTER — Other Ambulatory Visit: Payer: Self-pay

## 2019-04-01 ENCOUNTER — Ambulatory Visit (INDEPENDENT_AMBULATORY_CARE_PROVIDER_SITE_OTHER): Payer: BLUE CROSS/BLUE SHIELD

## 2019-04-01 VITALS — Temp 97.8°F

## 2019-04-01 DIAGNOSIS — M205X2 Other deformities of toe(s) (acquired), left foot: Secondary | ICD-10-CM | POA: Diagnosis not present

## 2019-04-01 DIAGNOSIS — J329 Chronic sinusitis, unspecified: Secondary | ICD-10-CM

## 2019-04-01 DIAGNOSIS — M7752 Other enthesopathy of left foot: Secondary | ICD-10-CM | POA: Diagnosis not present

## 2019-04-01 DIAGNOSIS — H547 Unspecified visual loss: Secondary | ICD-10-CM

## 2019-04-01 DIAGNOSIS — R519 Headache, unspecified: Secondary | ICD-10-CM

## 2019-04-01 MED ORDER — METHYLPREDNISOLONE 4 MG PO TBPK: ORAL_TABLET | ORAL | 0 refills | Status: DC

## 2019-04-01 NOTE — Progress Notes (Signed)
Based on what you shared with me, I feel your condition warrants further evaluation and I recommend that you be seen for a face to face office visit.  Given your symptoms of headache with blurred vision or double vision, you need to be seen face to face.    NOTE: If you entered your credit card information for this eVisit, you will not be charged. You may see a "hold" on your card for the $35 but that hold will drop off and you will not have a charge processed.  If you are having a true medical emergency please call 911.  If you need an urgent face to face visit,  has four urgent care centers for your convenience.    PLEASE NOTE: THE [facility] LOCATIONS AND URGENT CARE CLINICS DO NOT HAVE THE TESTING FOR CORONAVIRUS COVID19 AVAILABLE.  IF YOU FEEL YOU NEED THIS TEST YOU MUST GO TO A TRIAGE LOCATION AT ONE OF THE HOSPITAL EMERGENCY DEPARTMENTS   [email or web address] to reserve your spot online an avoid wait times  [facility] [address] [place], [place] [address] Modified hours of operation: Monday-Friday, 12 PM to 6 PM  Saturday & Sunday 10 AM to 4 PM *Across the street from [employer]  [facility] (New Address!) [address], [address] [place], [place] [address] *Just off [employer], across the road from [place]* Modified hours of operation: Monday-Friday, 12 PM to 6 PM  Closed Saturday & Sunday  [facility]'s modified hours of operation will be in effect from May 1 until May 31   The following sites will take your insurance:  . [facility]  [phone] Get Driving Directions Find a Provider at this Location  [address] [place], [place] [address] . 10 am to 8 pm Monday-Friday . 12 pm to 8 pm Saturday-Sunday   . [facility] at [facility]  [phone] Get Driving Directions Find a Provider at this Location  [address], [address] [place],  [place] [address] . 8 am to 8 pm Monday-Friday . 9 am to 6 pm Saturday . 11 am to 6 pm Sunday   . [facility] at [facility]  [phone] Get Driving Directions  [address] [place], [place] [address] . 8 am to 8 pm Monday-Friday . 8 am to 4 pm Saturday-Sunday   Your e-visit answers were reviewed by a board certified advanced clinical practitioner to complete your personal care plan.  Thank you for using e-Visits.

## 2019-04-01 NOTE — Telephone Encounter (Signed)
Called Pt and told her that they will be calling her to set up the MRI

## 2019-04-04 NOTE — Progress Notes (Signed)
   HPI: 43 year old female presenting today as a new patient with a chief complaint of sharp, stabbing pain of the 1st MPJ of the left foot that began 2-3 months ago. She reports associated swelling. Walking and touching the area increases the pain. She has been taking Celebrex and using ice/heat therapy with no significant relief. Patient is here for further evaluation and treatment.   Past Medical History:  Diagnosis Date  . Arthritis    low back and neck  . Back pain of lumbar region with sciatica   . Chronic kidney disease    kidney stones and infection  . Degenerative disc disease, cervical   . Family history of adverse reaction to anesthesia    "unable to put mother to sleep"  . Frequent headaches   . GERD (gastroesophageal reflux disease)   . Lichen   . Menorrhagia   . Ovarian cyst      Physical Exam: General: The patient is alert and oriented x3 in no acute distress.  Dermatology: Skin is warm, dry and supple bilateral lower extremities. Negative for open lesions or macerations.  Vascular: Palpable pedal pulses bilaterally. No edema or erythema noted. Capillary refill within normal limits.  Neurological: Epicritic and protective threshold grossly intact bilaterally.   Musculoskeletal Exam: Pain on palpation with limited range of motion noted to the first MPJ left foot.  Assessment: 1. DJD/Hallux limitus left    Plan of Care:  1. Patient evaluated.  2. Injection of 0.5 mLs Celestone Soluspan injected into the 1st MPJ of the left foot.  3. Prescription for Medrol Dose Pak provided to patient. Then continue taking Celebrex prescribed by PCP.  4. Recommended stiff sole shoes with a rocker bottom.  5. Return to clinic in 4 weeks.   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       Edrick Kins, DPM [facility]  Dr. Edrick Kins, DPM    [address], [place] [address]                Office  [phone]  Fax [phone]

## 2019-04-05 DIAGNOSIS — S3993XA Unspecified injury of pelvis, initial encounter: Secondary | ICD-10-CM | POA: Diagnosis not present

## 2019-04-05 DIAGNOSIS — Z9049 Acquired absence of other specified parts of digestive tract: Secondary | ICD-10-CM | POA: Diagnosis not present

## 2019-04-05 DIAGNOSIS — M25551 Pain in right hip: Secondary | ICD-10-CM | POA: Diagnosis not present

## 2019-04-05 DIAGNOSIS — S79911A Unspecified injury of right hip, initial encounter: Secondary | ICD-10-CM | POA: Diagnosis not present

## 2019-04-05 DIAGNOSIS — S76011A Strain of muscle, fascia and tendon of right hip, initial encounter: Secondary | ICD-10-CM | POA: Diagnosis not present

## 2019-04-05 DIAGNOSIS — M5030 Other cervical disc degeneration, unspecified cervical region: Secondary | ICD-10-CM | POA: Diagnosis not present

## 2019-04-05 DIAGNOSIS — R200 Anesthesia of skin: Secondary | ICD-10-CM | POA: Diagnosis not present

## 2019-04-05 DIAGNOSIS — M1990 Unspecified osteoarthritis, unspecified site: Secondary | ICD-10-CM | POA: Diagnosis not present

## 2019-04-05 DIAGNOSIS — R51 Headache: Secondary | ICD-10-CM | POA: Diagnosis not present

## 2019-04-05 DIAGNOSIS — R202 Paresthesia of skin: Secondary | ICD-10-CM | POA: Diagnosis not present

## 2019-04-05 DIAGNOSIS — M4854XA Collapsed vertebra, not elsewhere classified, thoracic region, initial encounter for fracture: Secondary | ICD-10-CM | POA: Diagnosis not present

## 2019-04-05 DIAGNOSIS — Z882 Allergy status to sulfonamides status: Secondary | ICD-10-CM | POA: Diagnosis not present

## 2019-04-05 DIAGNOSIS — W28XXXA Contact with powered lawn mower, initial encounter: Secondary | ICD-10-CM | POA: Diagnosis not present

## 2019-04-14 ENCOUNTER — Ambulatory Visit
Admission: RE | Admit: 2019-04-14 | Discharge: 2019-04-14 | Disposition: A | Discharge status: Home / Self Care | Payer: BC Managed Care – PPO | Source: Ambulatory Visit | Attending: Family Medicine | Admitting: Family Medicine

## 2019-04-14 ENCOUNTER — Other Ambulatory Visit: Payer: Self-pay

## 2019-04-14 DIAGNOSIS — M5412 Radiculopathy, cervical region: Secondary | ICD-10-CM | POA: Insufficient documentation

## 2019-04-14 DIAGNOSIS — M5126 Other intervertebral disc displacement, lumbar region: Secondary | ICD-10-CM | POA: Diagnosis not present

## 2019-04-14 DIAGNOSIS — M545 Low back pain, unspecified: Secondary | ICD-10-CM

## 2019-04-14 DIAGNOSIS — M47816 Spondylosis without myelopathy or radiculopathy, lumbar region: Secondary | ICD-10-CM | POA: Diagnosis not present

## 2019-04-14 DIAGNOSIS — G8929 Other chronic pain: Secondary | ICD-10-CM | POA: Insufficient documentation

## 2019-04-14 DIAGNOSIS — M47812 Spondylosis without myelopathy or radiculopathy, cervical region: Secondary | ICD-10-CM | POA: Diagnosis not present

## 2019-04-14 DIAGNOSIS — M4804 Spinal stenosis, thoracic region: Secondary | ICD-10-CM | POA: Insufficient documentation

## 2019-04-14 DIAGNOSIS — M48061 Spinal stenosis, lumbar region without neurogenic claudication: Secondary | ICD-10-CM | POA: Diagnosis not present

## 2019-04-14 DIAGNOSIS — M4802 Spinal stenosis, cervical region: Secondary | ICD-10-CM | POA: Diagnosis not present

## 2019-04-14 DIAGNOSIS — M5136 Other intervertebral disc degeneration, lumbar region: Secondary | ICD-10-CM | POA: Diagnosis not present

## 2019-04-14 DIAGNOSIS — M2578 Osteophyte, vertebrae: Secondary | ICD-10-CM | POA: Diagnosis not present

## 2019-04-14 DIAGNOSIS — M47814 Spondylosis without myelopathy or radiculopathy, thoracic region: Secondary | ICD-10-CM | POA: Diagnosis not present

## 2019-04-14 DIAGNOSIS — R1310 Dysphagia, unspecified: Secondary | ICD-10-CM | POA: Diagnosis not present

## 2019-04-14 IMAGING — MR MRI LUMBAR SPINE WITHOUT CONTRAST
4 of 5 series · 30 of 48 positions shown · non-contrast
Comparison: None available.

CLINICAL DATA: Initial evaluation for chronic neck with mid and
lower back pain and stiffness. Tingling in both arms, worse on the
right.

EXAM:
MRI CERVICAL, THORACIC AND LUMBAR SPINE WITHOUT CONTRAST
TECHNIQUE: Multiplanar and multiecho pulse sequences of the cervical spine, to
include the craniocervical junction and cervicothoracic junction,
and thoracic and lumbar spine, were obtained without intravenous
contrast.

[Series 16: T2 · sagittal · 4.0mm · 1.02mm/px · 6 of 16 slices shown (1 of 2)]
[im 1/16]
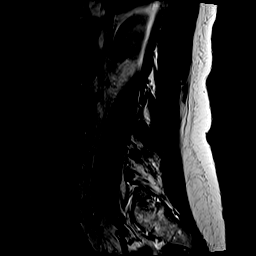
[im 4/16]
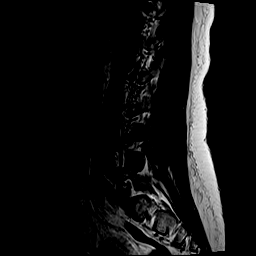
[im 7/16]
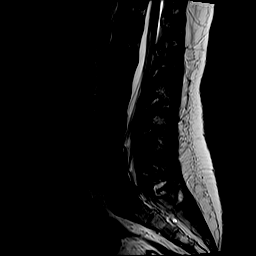
[im 10/16]
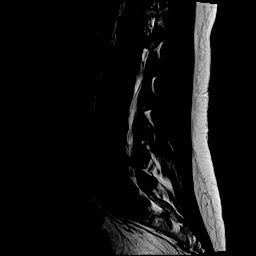
[im 13/16]
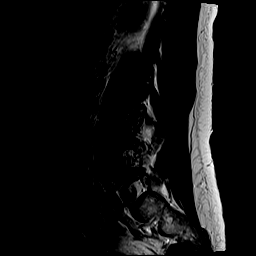
[im 16/16]
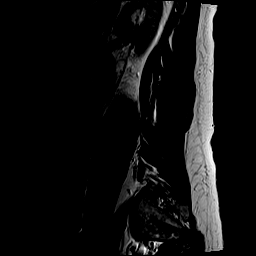

[Series 17: T1 · sagittal · 4.0mm · 1.02mm/px · 6 of 16 slices shown (1 of 2)]
[im 1/16]
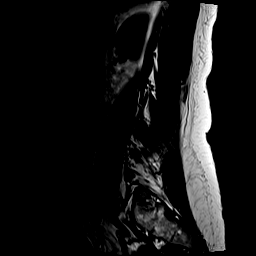
[im 4/16]
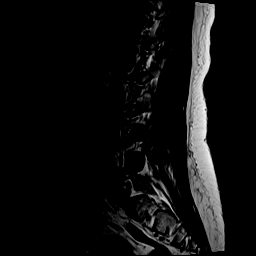
[im 7/16]
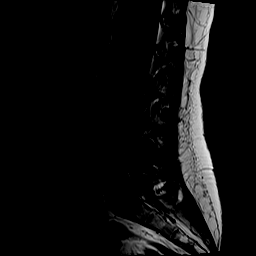
[im 10/16]
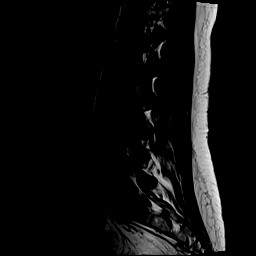
[im 13/16]
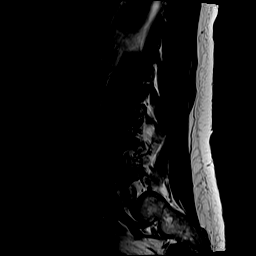
[im 16/16]
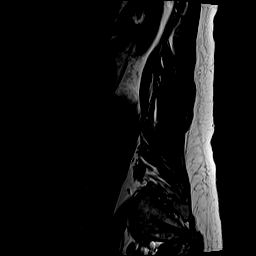

[Series 19: T2 · axial · 4.0mm · 0.78mm/px · z∈[-451,-212]mm · 9 of 41 slices shown (2 of 2)]
[im 1/41]
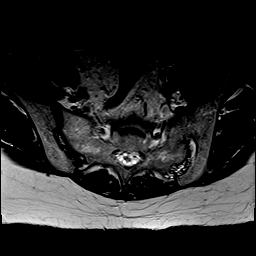
[im 6/41]
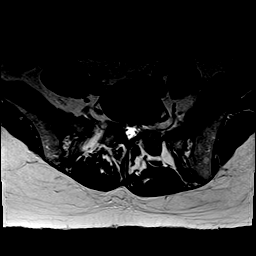
[im 12/41]
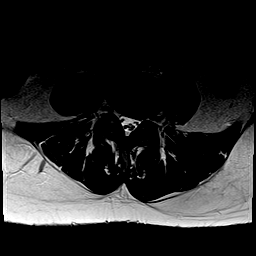
[im 18/41]
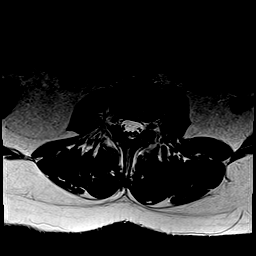
[im 21/41]
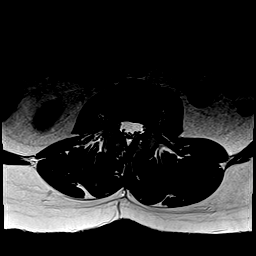
[im 23/41]
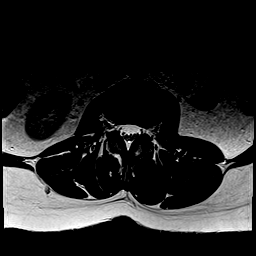
[im 29/41]
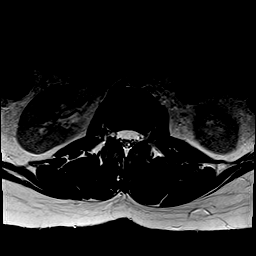
[im 35/41]
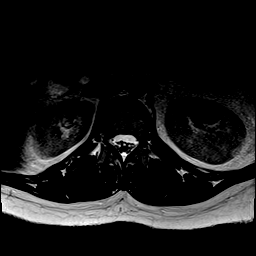
[im 41/41]
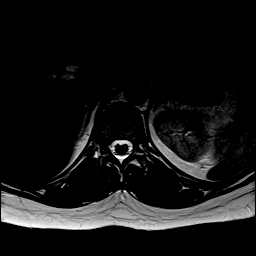

[Series 20: T1 · axial · 4.0mm · 0.39mm/px · z∈[-451,-212]mm · 9 of 41 slices shown (2 of 2)]
[im 1/41]
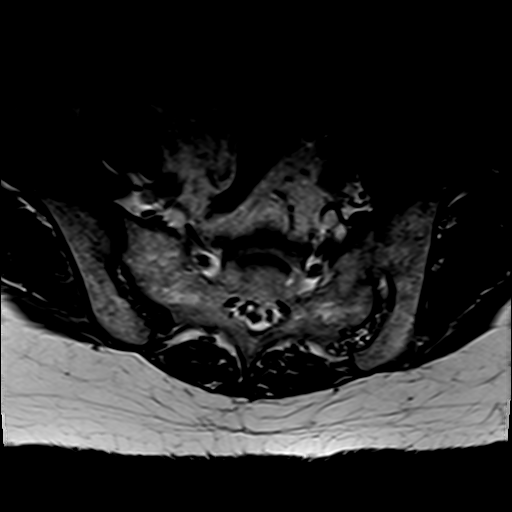
[im 6/41]
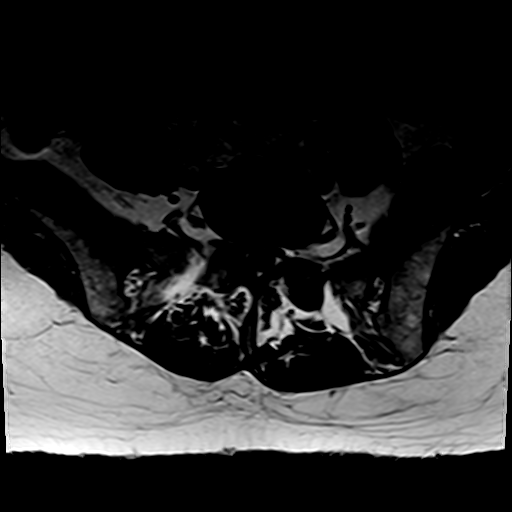
[im 12/41]
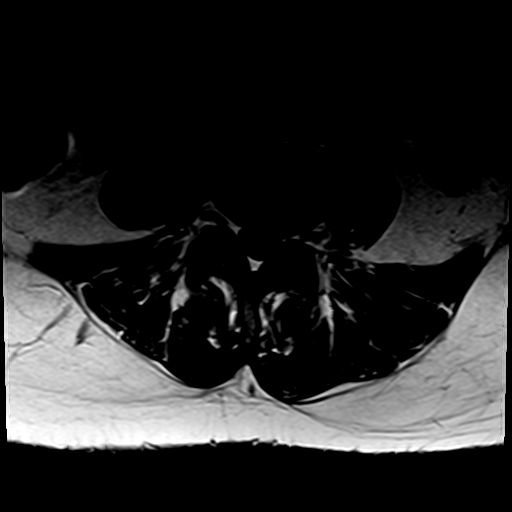
[im 18/41]
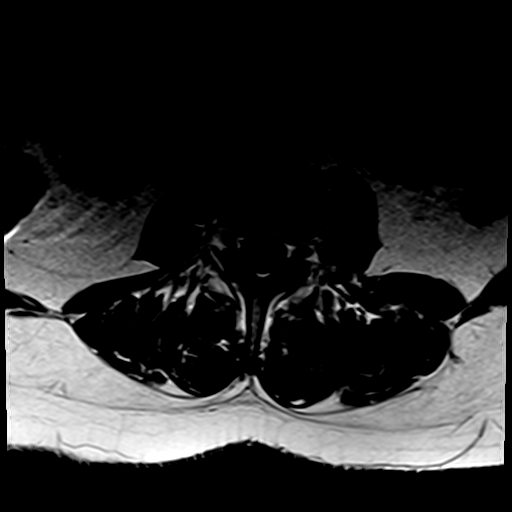
[im 21/41]
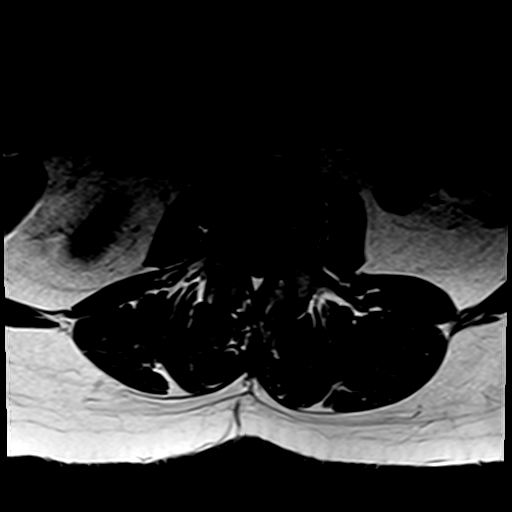
[im 23/41]
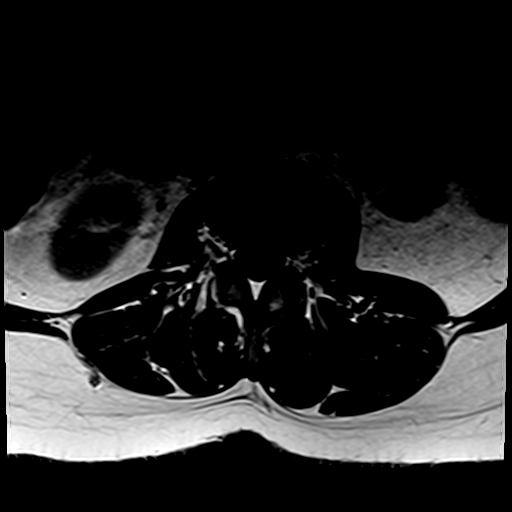
[im 29/41]
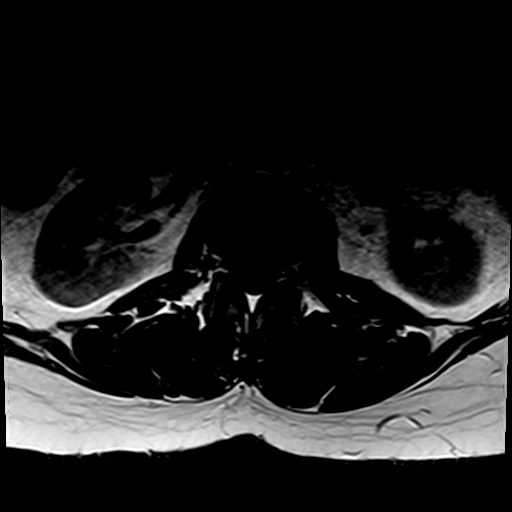
[im 35/41]
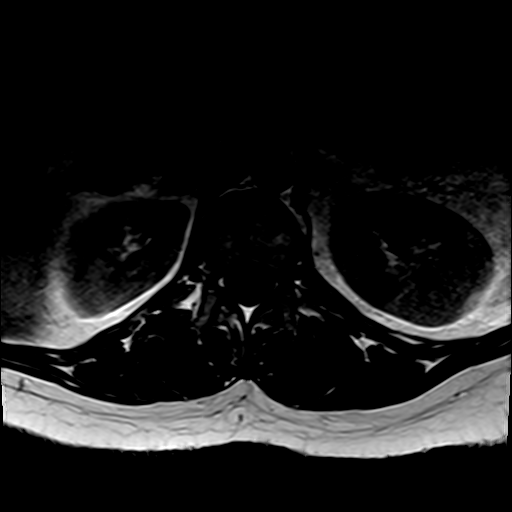
[im 41/41]
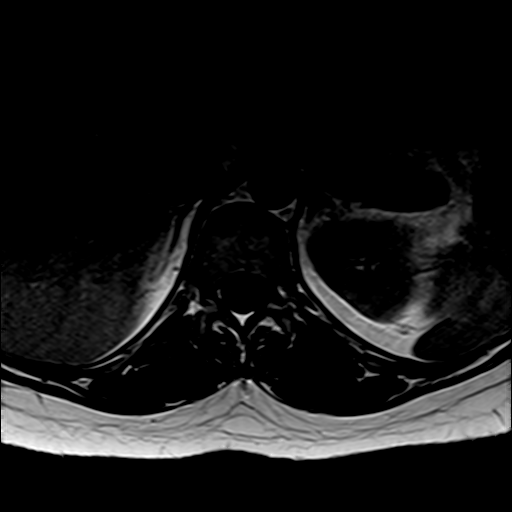

[30 of 48 positions shown; findings below may reference images not displayed]

FINDINGS: MRI CERVICAL SPINE FINDINGS

Alignment: Mild straightening and reversal of the normal cervical
lordosis, apex at C4-5. No listhesis.

Vertebrae: Vertebral body heights maintained without evidence for
acute or chronic fracture. Bone marrow signal intensity within
normal limits. No discrete or worrisome osseous lesions. No abnormal
marrow edema.

Cord: Signal intensity within the cervical spinal cord is normal.

Posterior Fossa, vertebral arteries, paraspinal tissues: Visualized
brain and posterior fossa within normal limits. Craniocervical
junction normal. Paraspinous and prevertebral soft tissues within
normal limits. Normal intravascular flow voids seen within the
vertebral arteries bilaterally.

Disc levels:

C2-C3: Negative interspace. Mild left-sided facet hypertrophy. No
canal or foraminal stenosis.

C3-C4: Mild diffuse disc bulge with uncovertebral hypertrophy. No
significant canal or foraminal stenosis.

C4-C5: Circumferential disc osteophyte, asymmetric to the right.
Broad posterior component flattens and effaces the ventral thecal
sac, greater on the right. Associated cord flattening without cord
signal changes. Moderate spinal stenosis. Moderate right worse than
left C5 foraminal stenosis.

C5-C6: Circumferential disc osteophyte with intervertebral disc
space narrowing, asymmetric to the right. Broad posterior component
flattens and effaces the ventral thecal sac, greater on the right.
Flattening of the right hemi cord without cord signal changes.
Moderate spinal stenosis. Moderate right worse than left C6
foraminal stenosis.

C6-C7:  Minimal annular disc bulge.  No canal or foraminal stenosis.

C7-T1:  Unremarkable.

MRI THORACIC SPINE FINDINGS

Alignment: Vertebral bodies normally aligned with preservation of
the normal thoracic kyphosis. No listhesis.

Vertebrae: Mild chronic height loss at the superior endplates of T4
and T5 noted. Vertebral body height otherwise maintained without
evidence for acute or subacute fracture. Bone marrow signal
intensity within normal limits. Subcentimeter benign hemangioma
noted within the T12 vertebral body. No other discrete or worrisome
osseous lesions. No abnormal marrow edema.

Cord: Signal intensity within the thoracic spinal cord is normal.
Normal cord caliber and morphology.

Paraspinal and other soft tissues: Paraspinous soft tissues within
normal limits. Visualized visceral structures unremarkable.

Disc levels:

T3-4: Negative interspace. Mild right-sided facet hypertrophy. No
canal or foraminal stenosis.

T4-5: Negative interspace. Mild right-sided facet hypertrophy. No
canal or foraminal stenosis.

No other significant degenerative changes seen within the thoracic
spine. No disc bulge or disc protrusion. No other significant facet
degeneration. No canal or foraminal stenosis.

MRI LUMBAR SPINE FINDINGS

Segmentation: Standard. Lowest well-formed disc labeled the L5-S1
level.

Alignment: Mild levoscoliosis. Alignment otherwise normal with
preservation of the normal lumbar lordosis. No listhesis or
malalignment.

Vertebrae: Vertebral body heights maintained without evidence for
acute or chronic fracture. Bone marrow signal intensity within
normal limits. No discrete or worrisome osseous lesions. No abnormal
marrow edema.

Conus medullaris and cauda equina: Conus extends to the L1-2 level.
Conus and cauda equina appear normal.

Paraspinal and other soft tissues: Paraspinous soft tissues within
normal limits. Visualized visceral structures are unremarkable.

Disc levels:

L1-2:  Unremarkable.

L2-3:  Unremarkable.

L3-4: Mild diffuse disc bulge with disc desiccation. Mild bilateral
facet hypertrophy. No significant spinal stenosis. Mild left L3
foraminal narrowing. No frank impingement.

L4-5: Mild diffuse disc bulge with disc desiccation. Superimposed
shallow left subarticular disc protrusion encroaches upon the left
lateral recess, contacting the descending left L5 nerve root (series
19, image 31). Mild bilateral facet hypertrophy. Mild left L4
foraminal stenosis.

L5-S1: Mild disc bulge with disc desiccation. Moderate bilateral
facet hypertrophy. Resultant mild to moderate bilateral lateral
recess stenosis without frank impingement. Foramina remain patent.
IMPRESSION: MRI CERVICAL SPINE IMPRESSION:

1. Degenerative disc osteophyte at C4-5 and C5-6 with resultant
moderate canal with right worse than left C5 and C6 foraminal
stenosis.
2. Additional minor degenerative changes elsewhere within the
cervical spine as above. No other significant canal or foraminal
encroachment.

MRI THORACIC SPINE IMPRESSION:

1. Mild height loss at the superior endplates of T4 and T5, chronic
in appearance. No evidence for acute or subacute compression
fracture.
2. Mild right-sided facet hypertrophy at T3-4 and T4-5 without
significant stenosis.
3. Otherwise unremarkable and normal MRI of the thoracic spine. No
significant canal or foraminal stenosis. No neural impingement.

MRI LUMBAR SPINE IMPRESSION:

1. Shallow left subarticular disc protrusion at L4-5, contacting and
potentially affecting the descending left S1 nerve root in the left
lateral recess.
2. Disc bulging with facet hypertrophy at L5-S1 with resultant mild
to moderate bilateral lateral recess stenosis (descending S1 nerve
root level).
3. Mild left L3 and L4 foraminal stenosis related to disc bulge and
facet hypertrophy.

## 2019-04-14 IMAGING — MR MRI THORACIC SPINE WITHOUT CONTRAST
6 series · 32 of 48 positions shown · non-contrast
Comparison: None available.

CLINICAL DATA: Initial evaluation for chronic neck with mid and
lower back pain and stiffness. Tingling in both arms, worse on the
right.

EXAM:
MRI CERVICAL, THORACIC AND LUMBAR SPINE WITHOUT CONTRAST
TECHNIQUE: Multiplanar and multiecho pulse sequences of the cervical spine, to
include the craniocervical junction and cervicothoracic junction,
and thoracic and lumbar spine, were obtained without intravenous
contrast.

[Series 16: T1 · sagittal · 5.0mm · 1.88mm/px · 4 of 9 slices shown (1 of 2)]
[im 1/9]
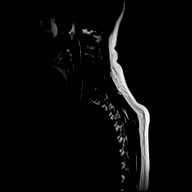
[im 3/9]
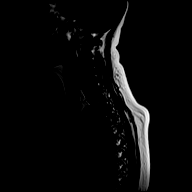
[im 6/9]
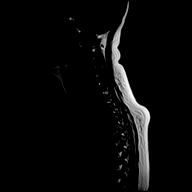
[im 9/9]
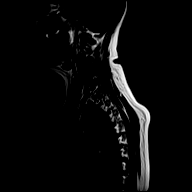

[Series 17: T2 · sagittal · 3.0mm · 1.33mm/px · 6 of 19 slices shown (1 of 2)]
[im 1/19]
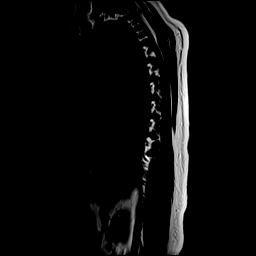
[im 4/19]
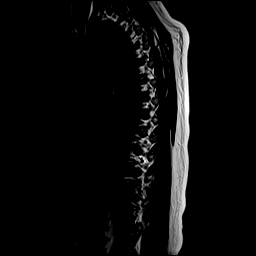
[im 8/19]
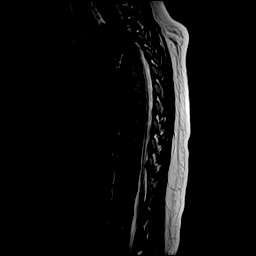
[im 11/19]
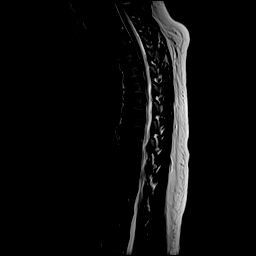
[im 15/19]
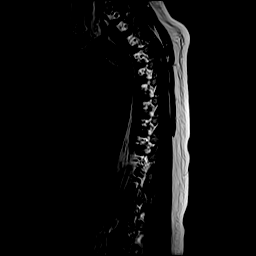
[im 19/19]
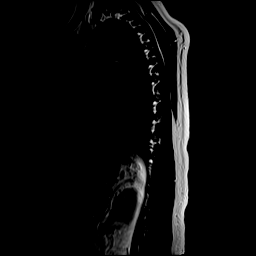

[Series 18: T1 · sagittal · 3.0mm · 1.33mm/px · 6 of 19 slices shown (2 of 2)]
[im 1/19]
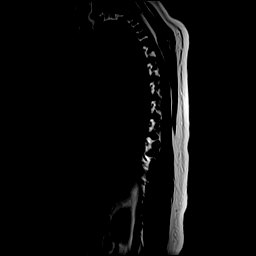
[im 4/19]
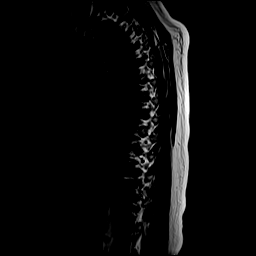
[im 8/19]
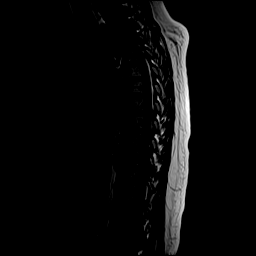
[im 11/19]
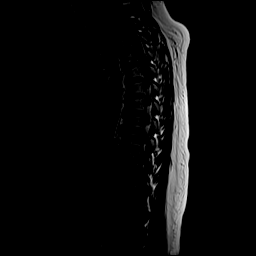
[im 15/19]
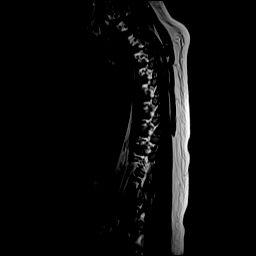
[im 19/19]
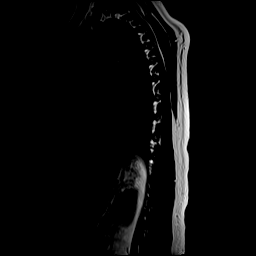

[Series 19: STIR · sagittal · 3.0mm · 0.66mm/px · 6 of 19 slices shown]
[im 1/19]
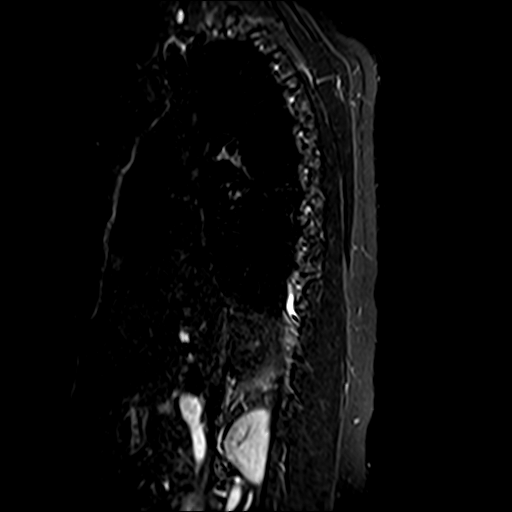
[im 4/19]
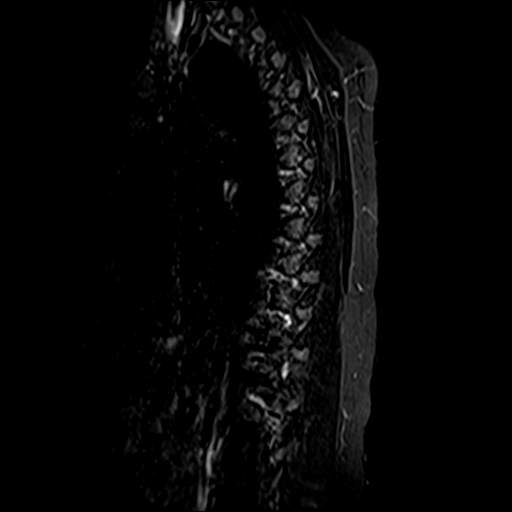
[im 8/19]
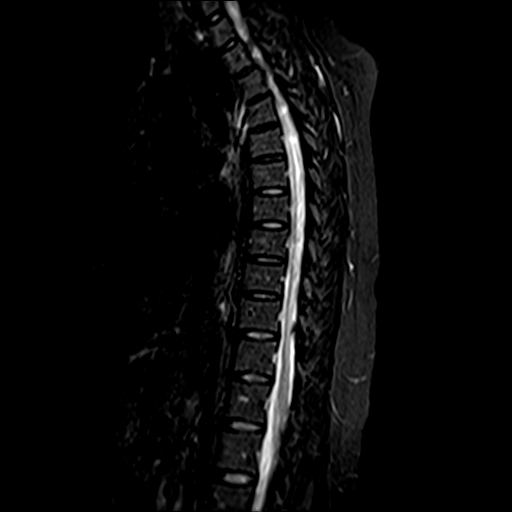
[im 11/19]
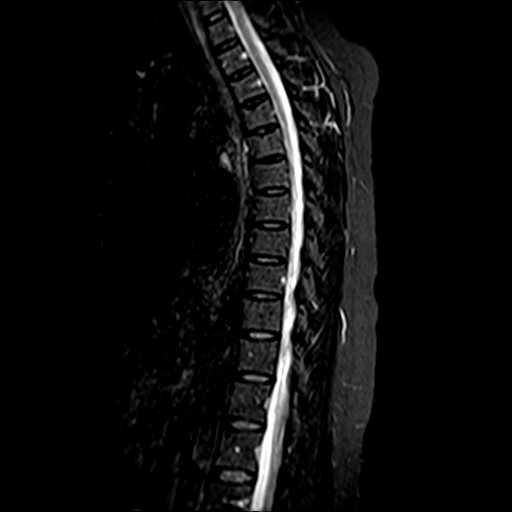
[im 15/19]
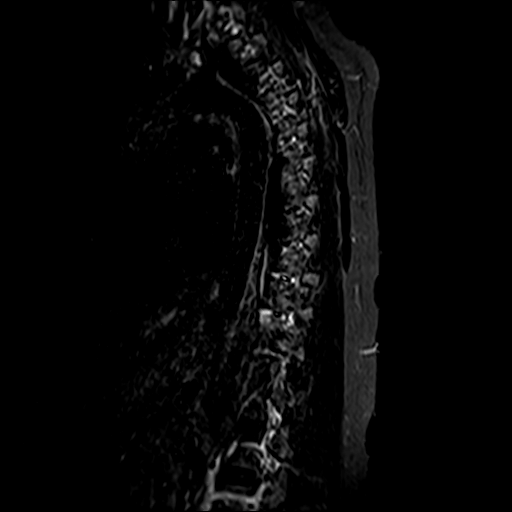
[im 19/19]
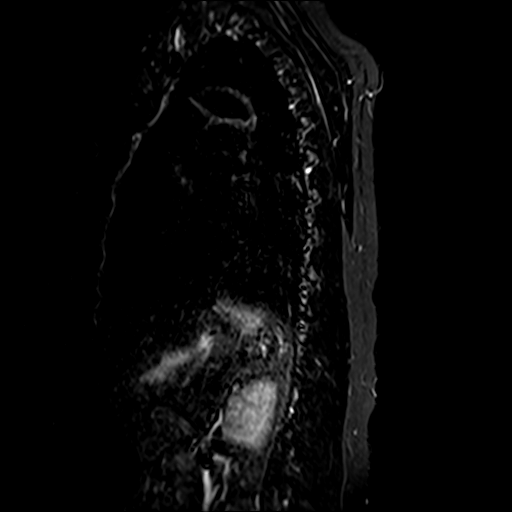

[Series 20: T2 · axial · 4.0mm · 0.59mm/px · z∈[-241,+12]mm · 9 of 41 slices shown (2 of 2)]
[im 1/41]
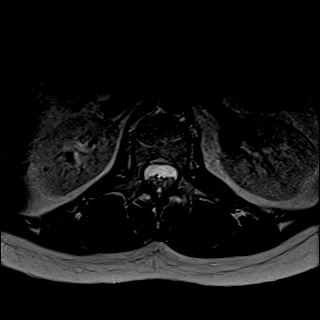
[im 7/41]
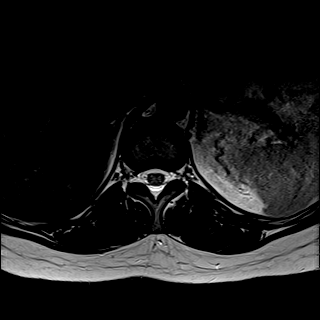
[im 14/41]
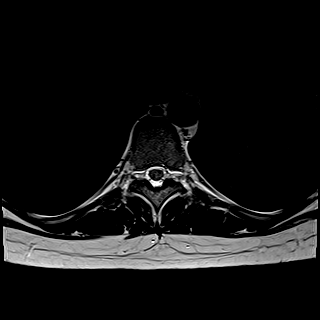
[im 17/41]
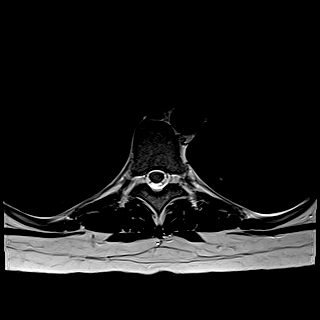
[im 21/41]
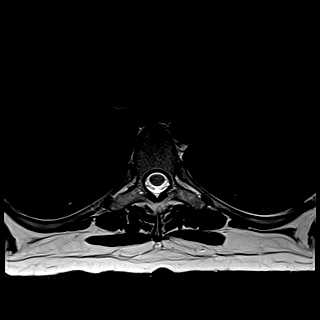
[im 24/41]
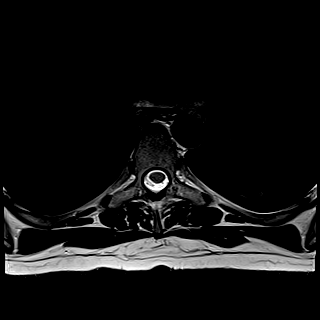
[im 27/41]
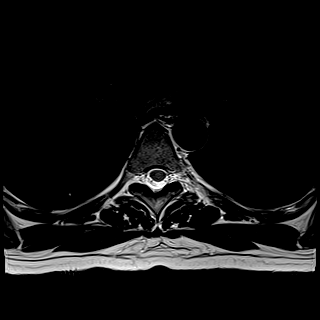
[im 34/41]
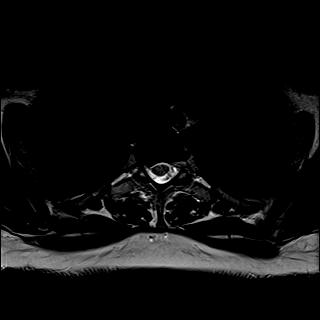
[im 41/41]
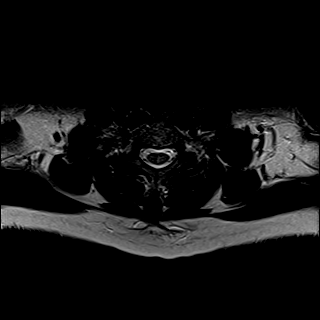

[Series 21: GRE · axial · 4.0mm · 0.37mm/px · 1 of 41 slices shown]
[im 1/41]
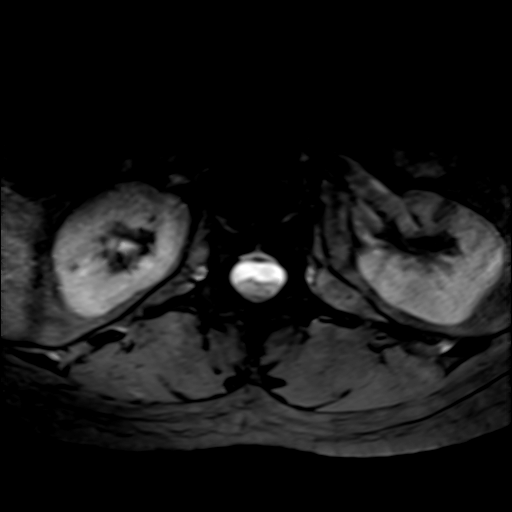

[32 of 48 positions shown; findings below may reference images not displayed]

FINDINGS: MRI CERVICAL SPINE FINDINGS

Alignment: Mild straightening and reversal of the normal cervical
lordosis, apex at C4-5. No listhesis.

Vertebrae: Vertebral body heights maintained without evidence for
acute or chronic fracture. Bone marrow signal intensity within
normal limits. No discrete or worrisome osseous lesions. No abnormal
marrow edema.

Cord: Signal intensity within the cervical spinal cord is normal.

Posterior Fossa, vertebral arteries, paraspinal tissues: Visualized
brain and posterior fossa within normal limits. Craniocervical
junction normal. Paraspinous and prevertebral soft tissues within
normal limits. Normal intravascular flow voids seen within the
vertebral arteries bilaterally.

Disc levels:

C2-C3: Negative interspace. Mild left-sided facet hypertrophy. No
canal or foraminal stenosis.

C3-C4: Mild diffuse disc bulge with uncovertebral hypertrophy. No
significant canal or foraminal stenosis.

C4-C5: Circumferential disc osteophyte, asymmetric to the right.
Broad posterior component flattens and effaces the ventral thecal
sac, greater on the right. Associated cord flattening without cord
signal changes. Moderate spinal stenosis. Moderate right worse than
left C5 foraminal stenosis.

C5-C6: Circumferential disc osteophyte with intervertebral disc
space narrowing, asymmetric to the right. Broad posterior component
flattens and effaces the ventral thecal sac, greater on the right.
Flattening of the right hemi cord without cord signal changes.
Moderate spinal stenosis. Moderate right worse than left C6
foraminal stenosis.

C6-C7:  Minimal annular disc bulge.  No canal or foraminal stenosis.

C7-T1:  Unremarkable.

MRI THORACIC SPINE FINDINGS

Alignment: Vertebral bodies normally aligned with preservation of
the normal thoracic kyphosis. No listhesis.

Vertebrae: Mild chronic height loss at the superior endplates of T4
and T5 noted. Vertebral body height otherwise maintained without
evidence for acute or subacute fracture. Bone marrow signal
intensity within normal limits. Subcentimeter benign hemangioma
noted within the T12 vertebral body. No other discrete or worrisome
osseous lesions. No abnormal marrow edema.

Cord: Signal intensity within the thoracic spinal cord is normal.
Normal cord caliber and morphology.

Paraspinal and other soft tissues: Paraspinous soft tissues within
normal limits. Visualized visceral structures unremarkable.

Disc levels:

T3-4: Negative interspace. Mild right-sided facet hypertrophy. No
canal or foraminal stenosis.

T4-5: Negative interspace. Mild right-sided facet hypertrophy. No
canal or foraminal stenosis.

No other significant degenerative changes seen within the thoracic
spine. No disc bulge or disc protrusion. No other significant facet
degeneration. No canal or foraminal stenosis.

MRI LUMBAR SPINE FINDINGS

Segmentation: Standard. Lowest well-formed disc labeled the L5-S1
level.

Alignment: Mild levoscoliosis. Alignment otherwise normal with
preservation of the normal lumbar lordosis. No listhesis or
malalignment.

Vertebrae: Vertebral body heights maintained without evidence for
acute or chronic fracture. Bone marrow signal intensity within
normal limits. No discrete or worrisome osseous lesions. No abnormal
marrow edema.

Conus medullaris and cauda equina: Conus extends to the L1-2 level.
Conus and cauda equina appear normal.

Paraspinal and other soft tissues: Paraspinous soft tissues within
normal limits. Visualized visceral structures are unremarkable.

Disc levels:

L1-2:  Unremarkable.

L2-3:  Unremarkable.

L3-4: Mild diffuse disc bulge with disc desiccation. Mild bilateral
facet hypertrophy. No significant spinal stenosis. Mild left L3
foraminal narrowing. No frank impingement.

L4-5: Mild diffuse disc bulge with disc desiccation. Superimposed
shallow left subarticular disc protrusion encroaches upon the left
lateral recess, contacting the descending left L5 nerve root (series
19, image 31). Mild bilateral facet hypertrophy. Mild left L4
foraminal stenosis.

L5-S1: Mild disc bulge with disc desiccation. Moderate bilateral
facet hypertrophy. Resultant mild to moderate bilateral lateral
recess stenosis without frank impingement. Foramina remain patent.
IMPRESSION: MRI CERVICAL SPINE IMPRESSION:

1. Degenerative disc osteophyte at C4-5 and C5-6 with resultant
moderate canal with right worse than left C5 and C6 foraminal
stenosis.
2. Additional minor degenerative changes elsewhere within the
cervical spine as above. No other significant canal or foraminal
encroachment.

MRI THORACIC SPINE IMPRESSION:

1. Mild height loss at the superior endplates of T4 and T5, chronic
in appearance. No evidence for acute or subacute compression
fracture.
2. Mild right-sided facet hypertrophy at T3-4 and T4-5 without
significant stenosis.
3. Otherwise unremarkable and normal MRI of the thoracic spine. No
significant canal or foraminal stenosis. No neural impingement.

MRI LUMBAR SPINE IMPRESSION:

1. Shallow left subarticular disc protrusion at L4-5, contacting and
potentially affecting the descending left S1 nerve root in the left
lateral recess.
2. Disc bulging with facet hypertrophy at L5-S1 with resultant mild
to moderate bilateral lateral recess stenosis (descending S1 nerve
root level).
3. Mild left L3 and L4 foraminal stenosis related to disc bulge and
facet hypertrophy.

## 2019-04-14 IMAGING — MR MRI CERVICAL SPINE WITHOUT CONTRAST
5 series · 33 of 48 positions shown · non-contrast
Comparison: None available.

CLINICAL DATA: Initial evaluation for chronic neck with mid and
lower back pain and stiffness. Tingling in both arms, worse on the
right.

EXAM:
MRI CERVICAL, THORACIC AND LUMBAR SPINE WITHOUT CONTRAST
TECHNIQUE: Multiplanar and multiecho pulse sequences of the cervical spine, to
include the craniocervical junction and cervicothoracic junction,
and thoracic and lumbar spine, were obtained without intravenous
contrast.

[Series 16: T2 · sagittal · 3.0mm · 0.62mm/px · 6 of 15 slices shown (1 of 2)]
[im 1/15]
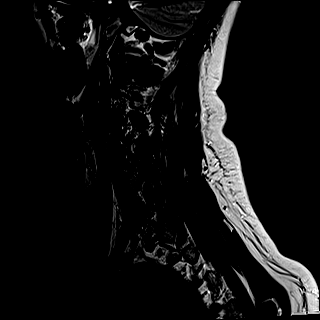
[im 3/15]
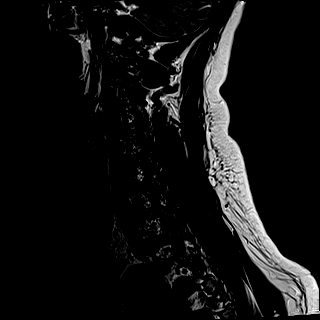
[im 6/15]
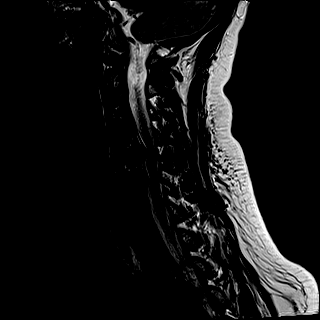
[im 9/15]
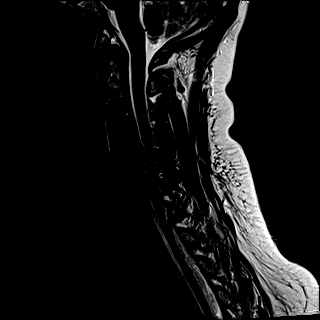
[im 12/15]
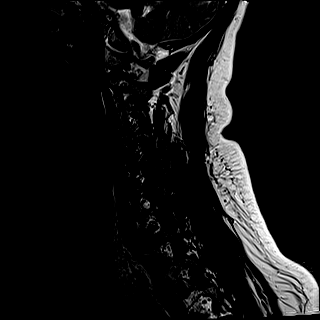
[im 15/15]
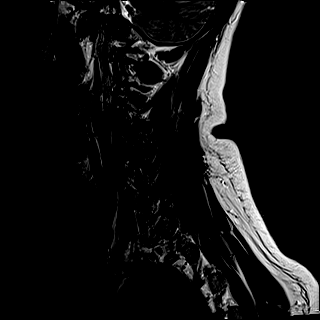

[Series 17: FLAIR · sagittal · 3.0mm · 0.78mm/px · 7 of 15 slices shown]
[im 1/15]
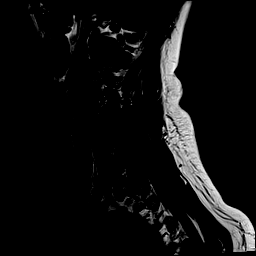
[im 3/15]
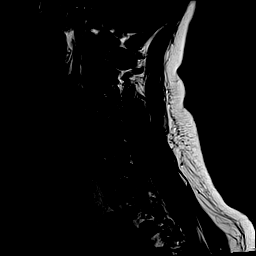
[im 5/15]
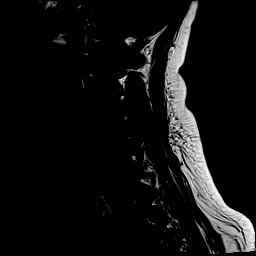
[im 8/15]
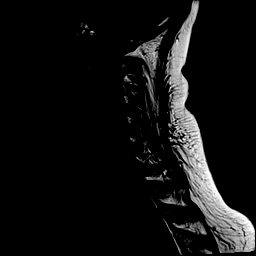
[im 10/15]
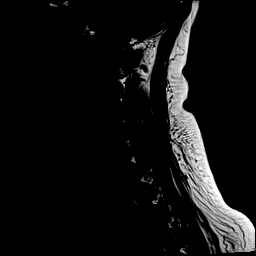
[im 12/15]
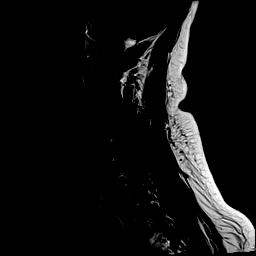
[im 15/15]
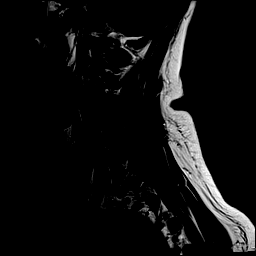

[Series 18: STIR · sagittal · 3.0mm · 0.62mm/px · 7 of 15 slices shown]
[im 1/15]
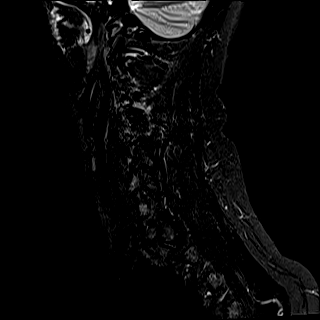
[im 3/15]
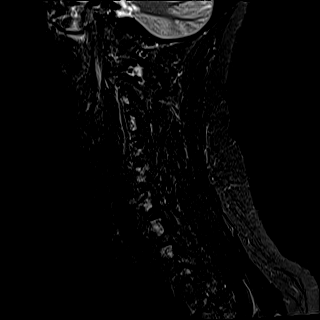
[im 5/15]
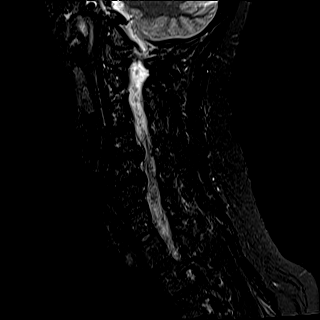
[im 8/15]
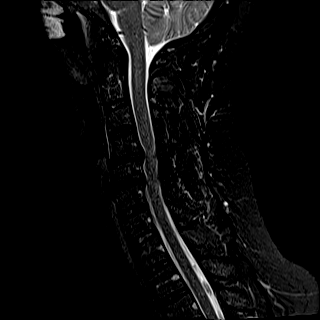
[im 10/15]
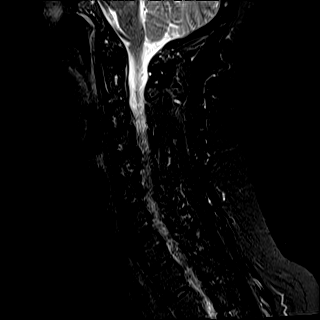
[im 12/15]
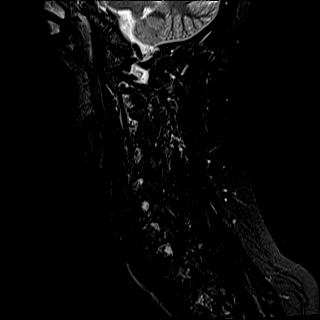
[im 15/15]
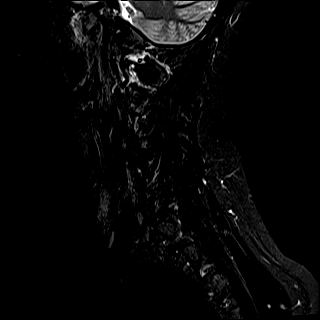

[Series 19: T2 · axial · 3.0mm · 0.70mm/px · z∈[+5,+104]mm · 8 of 30 slices shown (2 of 2)]
[im 1/30]
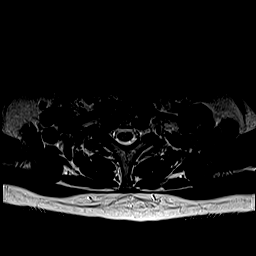
[im 5/30]
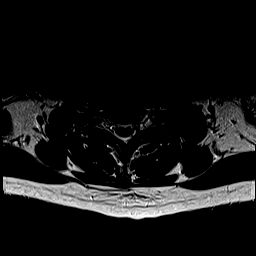
[im 9/30]
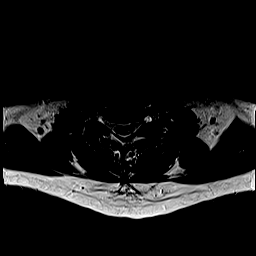
[im 14/30]
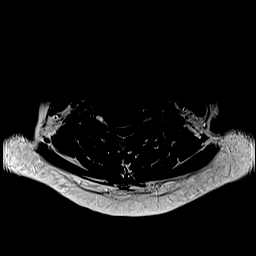
[im 16/30]
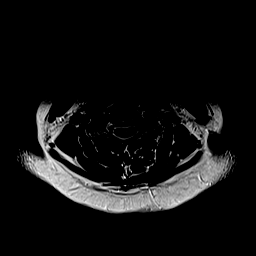
[im 21/30]
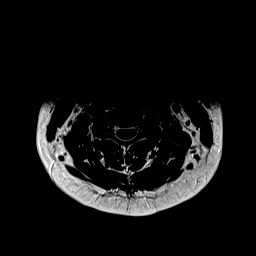
[im 25/30]
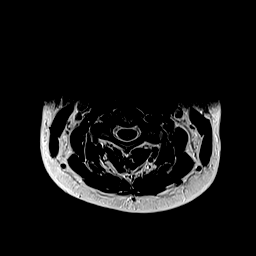
[im 30/30]
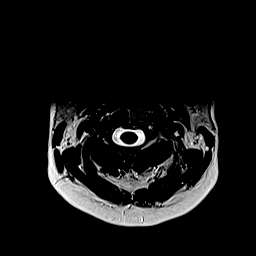

[Series 20: csp ax mpgr · axial · 3.0mm · 0.35mm/px · z∈[+5,+56]mm · 5 of 30 slices shown]
[im 1/30]
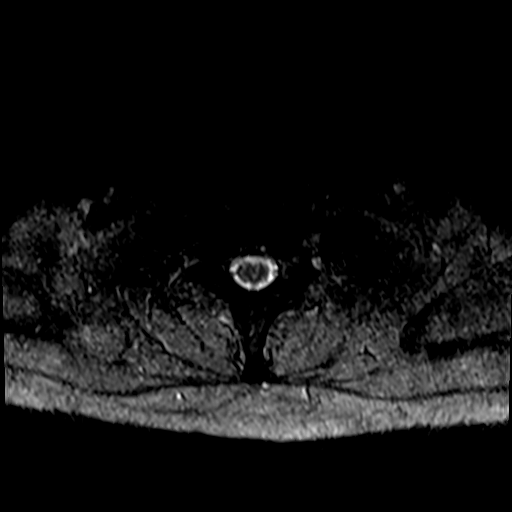
[im 5/30]
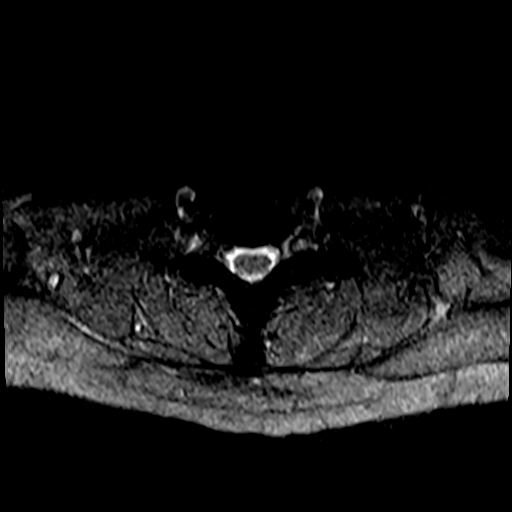
[im 9/30]
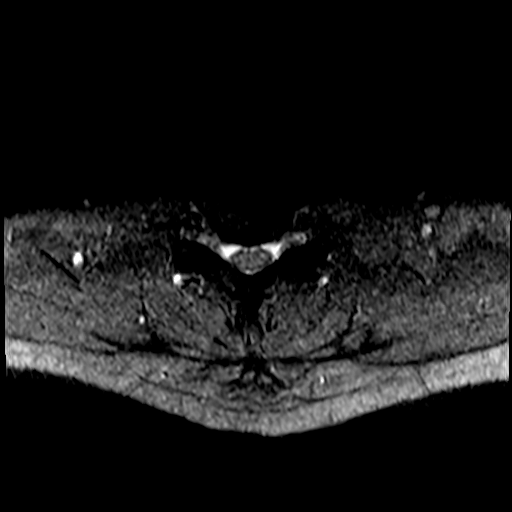
[im 14/30]
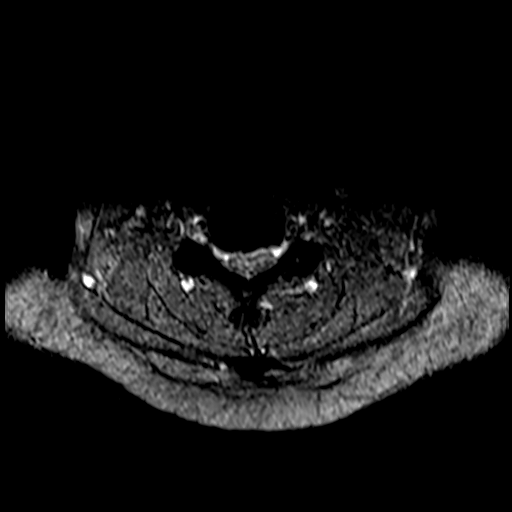
[im 16/30]
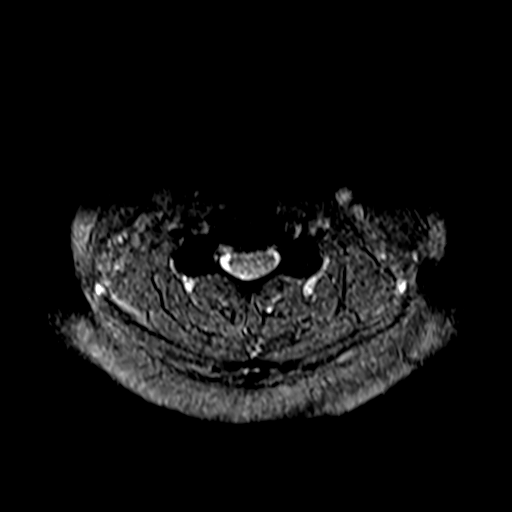

[33 of 48 positions shown; findings below may reference images not displayed]

FINDINGS: MRI CERVICAL SPINE FINDINGS

Alignment: Mild straightening and reversal of the normal cervical
lordosis, apex at C4-5. No listhesis.

Vertebrae: Vertebral body heights maintained without evidence for
acute or chronic fracture. Bone marrow signal intensity within
normal limits. No discrete or worrisome osseous lesions. No abnormal
marrow edema.

Cord: Signal intensity within the cervical spinal cord is normal.

Posterior Fossa, vertebral arteries, paraspinal tissues: Visualized
brain and posterior fossa within normal limits. Craniocervical
junction normal. Paraspinous and prevertebral soft tissues within
normal limits. Normal intravascular flow voids seen within the
vertebral arteries bilaterally.

Disc levels:

C2-C3: Negative interspace. Mild left-sided facet hypertrophy. No
canal or foraminal stenosis.

C3-C4: Mild diffuse disc bulge with uncovertebral hypertrophy. No
significant canal or foraminal stenosis.

C4-C5: Circumferential disc osteophyte, asymmetric to the right.
Broad posterior component flattens and effaces the ventral thecal
sac, greater on the right. Associated cord flattening without cord
signal changes. Moderate spinal stenosis. Moderate right worse than
left C5 foraminal stenosis.

C5-C6: Circumferential disc osteophyte with intervertebral disc
space narrowing, asymmetric to the right. Broad posterior component
flattens and effaces the ventral thecal sac, greater on the right.
Flattening of the right hemi cord without cord signal changes.
Moderate spinal stenosis. Moderate right worse than left C6
foraminal stenosis.

C6-C7:  Minimal annular disc bulge.  No canal or foraminal stenosis.

C7-T1:  Unremarkable.

MRI THORACIC SPINE FINDINGS

Alignment: Vertebral bodies normally aligned with preservation of
the normal thoracic kyphosis. No listhesis.

Vertebrae: Mild chronic height loss at the superior endplates of T4
and T5 noted. Vertebral body height otherwise maintained without
evidence for acute or subacute fracture. Bone marrow signal
intensity within normal limits. Subcentimeter benign hemangioma
noted within the T12 vertebral body. No other discrete or worrisome
osseous lesions. No abnormal marrow edema.

Cord: Signal intensity within the thoracic spinal cord is normal.
Normal cord caliber and morphology.

Paraspinal and other soft tissues: Paraspinous soft tissues within
normal limits. Visualized visceral structures unremarkable.

Disc levels:

T3-4: Negative interspace. Mild right-sided facet hypertrophy. No
canal or foraminal stenosis.

T4-5: Negative interspace. Mild right-sided facet hypertrophy. No
canal or foraminal stenosis.

No other significant degenerative changes seen within the thoracic
spine. No disc bulge or disc protrusion. No other significant facet
degeneration. No canal or foraminal stenosis.

MRI LUMBAR SPINE FINDINGS

Segmentation: Standard. Lowest well-formed disc labeled the L5-S1
level.

Alignment: Mild levoscoliosis. Alignment otherwise normal with
preservation of the normal lumbar lordosis. No listhesis or
malalignment.

Vertebrae: Vertebral body heights maintained without evidence for
acute or chronic fracture. Bone marrow signal intensity within
normal limits. No discrete or worrisome osseous lesions. No abnormal
marrow edema.

Conus medullaris and cauda equina: Conus extends to the L1-2 level.
Conus and cauda equina appear normal.

Paraspinal and other soft tissues: Paraspinous soft tissues within
normal limits. Visualized visceral structures are unremarkable.

Disc levels:

L1-2:  Unremarkable.

L2-3:  Unremarkable.

L3-4: Mild diffuse disc bulge with disc desiccation. Mild bilateral
facet hypertrophy. No significant spinal stenosis. Mild left L3
foraminal narrowing. No frank impingement.

L4-5: Mild diffuse disc bulge with disc desiccation. Superimposed
shallow left subarticular disc protrusion encroaches upon the left
lateral recess, contacting the descending left L5 nerve root (series
19, image 31). Mild bilateral facet hypertrophy. Mild left L4
foraminal stenosis.

L5-S1: Mild disc bulge with disc desiccation. Moderate bilateral
facet hypertrophy. Resultant mild to moderate bilateral lateral
recess stenosis without frank impingement. Foramina remain patent.
IMPRESSION: MRI CERVICAL SPINE IMPRESSION:

1. Degenerative disc osteophyte at C4-5 and C5-6 with resultant
moderate canal with right worse than left C5 and C6 foraminal
stenosis.
2. Additional minor degenerative changes elsewhere within the
cervical spine as above. No other significant canal or foraminal
encroachment.

MRI THORACIC SPINE IMPRESSION:

1. Mild height loss at the superior endplates of T4 and T5, chronic
in appearance. No evidence for acute or subacute compression
fracture.
2. Mild right-sided facet hypertrophy at T3-4 and T4-5 without
significant stenosis.
3. Otherwise unremarkable and normal MRI of the thoracic spine. No
significant canal or foraminal stenosis. No neural impingement.

MRI LUMBAR SPINE IMPRESSION:

1. Shallow left subarticular disc protrusion at L4-5, contacting and
potentially affecting the descending left S1 nerve root in the left
lateral recess.
2. Disc bulging with facet hypertrophy at L5-S1 with resultant mild
to moderate bilateral lateral recess stenosis (descending S1 nerve
root level).
3. Mild left L3 and L4 foraminal stenosis related to disc bulge and
facet hypertrophy.

## 2019-04-19 ENCOUNTER — Telehealth: Payer: BC Managed Care – PPO | Admitting: Physician Assistant

## 2019-04-19 ENCOUNTER — Encounter: Payer: Self-pay | Admitting: Family Medicine

## 2019-04-19 DIAGNOSIS — M545 Low back pain, unspecified: Secondary | ICD-10-CM

## 2019-04-19 DIAGNOSIS — G8929 Other chronic pain: Secondary | ICD-10-CM | POA: Diagnosis not present

## 2019-04-19 MED ORDER — NAPROXEN 500 MG PO TABS: 500.0000 mg | ORAL_TABLET | Freq: Two times a day (BID) | ORAL | 0 refills | Status: DC

## 2019-04-19 MED ORDER — CYCLOBENZAPRINE HCL 5 MG PO TABS: 2.5000 mg | ORAL_TABLET | Freq: Three times a day (TID) | ORAL | 1 refills | Status: DC | PRN

## 2019-04-19 NOTE — Progress Notes (Signed)
We are sorry that you are not feeling well.  Here is how we plan to help!  Based on what you have shared with me it looks like you mostly have acute back pain.  Acute back pain is defined as musculoskeletal pain that can resolve in 1-3 weeks with conservative treatment. I will compose a and send it to your mychart account stating that you can go back to work on Friday.   I have prescribed Naprosyn 500 mg twice a day non-steroid anti-inflammatory (NSAID) as well as Flexeril 10 mg every eight hours as needed which is a muscle relaxer  Some patients experience stomach irritation or in increased heartburn with anti-inflammatory drugs.  Please keep in mind that muscle relaxer's can cause fatigue and should not be taken while at work or driving.  Back pain is very common.  The pain often gets better over time.  The cause of back pain is usually not dangerous.  Most people can learn to manage their back pain on their own.  Home Care Stay active.  Start with short walks on flat ground if you can.  Try to walk farther each day. Do not sit, drive or stand in one place for more than 30 minutes.  Do not stay in bed. Do not avoid exercise or work.  Activity can help your back heal faster. Be careful when you bend or lift an object.  Bend at your knees, keep the object close to you, and do not twist. Sleep on a firm mattress.  Lie on your side, and bend your knees.  If you lie on your back, put a pillow under your knees. Only take medicines as told by your doctor. Put ice on the injured area. Put ice in a plastic bag Place a towel between your skin and the bag Leave the ice on for 15-20 minutes, 3-4 times a day for the first 2-3 days. 210 After that, you can switch between ice and heat packs. Ask your doctor about back exercises or massage. Avoid feeling anxious or stressed.  Find good ways to deal with stress, such as exercise.  Get Help Right Way If: Your pain does not go away with rest or medicine. Your  pain does not go away in 1 week. You have new problems. You do not feel well. The pain spreads into your legs. You cannot control when you poop (bowel movement) or pee (urinate) You feel sick to your stomach (nauseous) or throw up (vomit) You have belly (abdominal) pain. You feel like you may pass out (faint). If you develop a fever.  Make Sure you: Understand these instructions. Will watch your condition Will get help right away if you are not doing well or get worse.  Your e-visit answers were reviewed by a board certified advanced clinical practitioner to complete your personal care plan.  Depending on the condition, your plan could have included both over the counter or prescription medications.  If there is a problem please reply once you have received a response from your provider.  Your safety is important to [place].  If you have drug allergies check your prescription carefully.    You can use MyChart to ask questions about today's visit, request a non-urgent call back, or ask for a work or school excuse for 24 hours related to this e-Visit. If it has been greater than 24 hours you will need to follow up with your provider, or enter a new e-Visit to address those concerns.  You  will get an e-mail in the next two days asking about your experience.  I hope that your e-visit has been valuable and will speed your recovery. Thank you for using e-visits.   ===View-only below this line===   ----- Message -----    From: Maryella ShiversAnita L Isip    Sent: 04/19/2019  9:46 AM EDT      To: E-Visit Mailing List Subject: Back Pain  Back Pain --------------------------------  Question: Where are you having pain Answer:   Right lower back  Question: Does the pain extend into your legs? Answer:   No  Question: Are you having any numbness or weakness of the legs? Answer:   Yes  Question: Does this pain radiate to the abdomen or groin? Answer:   Yes  Question: How bad is the pain? Answer:    The pain is moderate  Question: Did you have an injury that caused the pain? Answer:   Yes, the pain started after an injury  Question: Please describe the circumstances of your injury Answer:   My pain is in my hip I cant hardly move it. My leg is tingling . I couldn't even go to work today because of the pain. I need a doctor's note  Question: Was your injury related to your job? Answer:   No  Question: How long has the pain been present? Answer:   More than 4 weeks  Question: Have you had back pain in the past? Answer:   Yes, I have many times had pain similiar to this before  Question: Do you have a history of fever associated with your back pain? Answer:   No  Question: Please list any medications you have previously taken for back pain. Answer:   Ibuprofen tylenol prednisone muscle relaxer  Question: Do you have a fever? Answer:   No, I do not have a fever  Question: Do you have any of the following? Answer:   None of the above  Question: What makes the pain worse? Answer:   Any movement            Sitting down            Strenuous activity  Question: What makes the pain better Answer:   Nothing makes it better  Question: Do you get relief with certain positions (even if only partial relief)? Answer:   No  Question: Have you ever been diagnosed with cancer? Answer:   No  Question: Have you ever been diagnosed with arthritis? Answer:   Yes  Question: Have you ever been diagnosed with osteoporosis or any other bone weakness? Answer:   Yes  Question: Have you ever had surgery on your back or spine? Answer:   No  Question: Do you have a history of Intravenous Drug Use? Answer:   No  Question: What is your usual health status? Answer:   I am active and can move normally  Question: Are you pregnant? Answer:   I am confident that I am not pregnant  Question: Are you breastfeeding? Answer:   No  Question: Please list your medication allergies that you  may have ? (If 'none' , please list as 'none') Answer:   Sulfa  Question: Please list any additional comments  Answer:     A total of 5-10 minutes was spent evaluating this patients questionnaire and formulating a plan of care.

## 2019-04-20 NOTE — Telephone Encounter (Signed)
Called Pt and scheduled her a Doxy visit for 04/21/19 @ 9:00am

## 2019-04-20 NOTE — Telephone Encounter (Signed)
Please set up visit with patient to discuss pain/work restrictions

## 2019-04-20 NOTE — Telephone Encounter (Signed)
Doxy Appt set for 04/21/2019

## 2019-04-21 ENCOUNTER — Other Ambulatory Visit: Payer: Self-pay

## 2019-04-21 ENCOUNTER — Encounter: Payer: Self-pay | Admitting: Family Medicine

## 2019-04-21 ENCOUNTER — Ambulatory Visit (INDEPENDENT_AMBULATORY_CARE_PROVIDER_SITE_OTHER): Payer: BC Managed Care – PPO | Admitting: Family Medicine

## 2019-04-21 DIAGNOSIS — M5412 Radiculopathy, cervical region: Secondary | ICD-10-CM | POA: Diagnosis not present

## 2019-04-21 DIAGNOSIS — M546 Pain in thoracic spine: Secondary | ICD-10-CM

## 2019-04-21 DIAGNOSIS — G8929 Other chronic pain: Secondary | ICD-10-CM | POA: Diagnosis not present

## 2019-04-21 DIAGNOSIS — M545 Low back pain: Secondary | ICD-10-CM

## 2019-04-21 MED ORDER — TRAMADOL HCL 50 MG PO TABS: 50.0000 mg | ORAL_TABLET | Freq: Four times a day (QID) | ORAL | 0 refills | Status: AC | PRN

## 2019-04-21 NOTE — Telephone Encounter (Signed)
Christine Simmons -- can you get the handicapp form and start filling out for patient and then I can sign?  Thanks?  LG

## 2019-04-21 NOTE — Telephone Encounter (Signed)
Form completed and signed ready to be picked up

## 2019-04-21 NOTE — Progress Notes (Signed)
Patient ID: Christine Simmons, female   DOB: May 29, 1976, 43 y.o.   MRN: [id number]    Virtual Visit via video Note  This visit type was conducted due to national recommendations for restrictions regarding the COVID-19 pandemic (e.g. social distancing).  This format is felt to be most appropriate for this patient at this time.  All issues noted in this document were discussed and addressed.  No physical exam was performed (except for noted visual exam findings with Video Visits).   I connected with Christine Simmons today at  9:00 AM EDT by a video enabled telemedicine application and verified that I am speaking with the correct person using two identifiers. Location patient: home Location provider: [facility]  Persons participating in the virtual visit: patient, provider  I discussed the limitations, risks, security and privacy concerns of performing an evaluation and management service by video and the availability of in person appointments. I also discussed with the patient that there may be a patient responsible charge related to this service. The patient expressed understanding and agreed to proceed.  HPI:  Patient and I connected via video to follow-up neck and back pain.  Patient struggled with back pain for years, but seems to be getting worse over the last 6 to 12 months.  Patient injured self last summer after jumping over a creek and landing hard on the ground.  Also states she was mowing a couple weeks ago on riding lawnmower and hit a dip in the grass which caused her to bounce on the seat which seemed to hurt her low back and cause more low back pain.  Even with the lifting and weight restrictions at work, doing her job is getting harder and harder.  Patient works at a [employer] and does have to do lifting for her job.  States she will work 1 or 2 days and is in so much pain by the end of her shift she feels as if she can barely move.  Does have numbness and tingling in  right upper extremity at times.  Pain in low back radiates across both sides and at times will go into both of her legs.  Denies any loss of bowel or bladder control or any saddle anesthesia.   IMPRESSION: MRI CERVICAL SPINE IMPRESSION:  1. Degenerative disc osteophyte at C4-5 and C5-6 with resultant moderate canal with right worse than left C5 and C6 foraminal stenosis. 2. Additional minor degenerative changes elsewhere within the cervical spine as above. No other significant canal or foraminal encroachment.  MRI THORACIC SPINE IMPRESSION:  1. Mild height loss at the superior endplates of T4 and T5, chronic in appearance. No evidence for acute or subacute compression fracture. 2. Mild right-sided facet hypertrophy at T3-4 and T4-5 without significant stenosis. 3. Otherwise unremarkable and normal MRI of the thoracic spine. No significant canal or foraminal stenosis. No neural impingement.  MRI LUMBAR SPINE IMPRESSION:  1. Shallow left subarticular disc protrusion at L4-5, contacting and potentially affecting the descending left S1 nerve root in the left lateral recess. 2. Disc bulging with facet hypertrophy at L5-S1 with resultant mild to moderate bilateral lateral recess stenosis (descending S1 nerve root level). 3. Mild left L3 and L4 foraminal stenosis related to disc bulge and facet hypertrophy.   ROS:  Constitutional: Negative for chills, fatigue and fever.  HENT: Negative for congestion, ear pain, sinus pain and sore throat.   Eyes: Negative.   Respiratory: Negative for cough, shortness of breath and wheezing.  Cardiovascular: Negative for chest pain, palpitations and leg swelling.  Gastrointestinal: Negative for abdominal pain, diarrhea, nausea and vomiting.  Genitourinary: Negative for dysuria, frequency and urgency.  Musculoskeletal: +neck, mid back and low back pain  Skin: Negative for color change, pallor and rash.  Neurological: Negative for syncope,  light-headedness and headaches.  Psychiatric/Behavioral: The patient is not nervous/anxious.     Past Medical History:  Diagnosis Date  . Arthritis    low back and neck  . Back pain of lumbar region with sciatica   . Chronic kidney disease    kidney stones and infection  . Degenerative disc disease, cervical   . Family history of adverse reaction to anesthesia    "unable to put mother to sleep"  . Frequent headaches   . GERD (gastroesophageal reflux disease)   . Lichen   . Menorrhagia   . Ovarian cyst     Past Surgical History:  Procedure Laterality Date  . ABDOMINAL HYSTERECTOMY    . ANKLE SURGERY Left   . CESAREAN SECTION    . CHOLECYSTECTOMY N/A 04/10/2015   Procedure: LAPAROSCOPIC CHOLECYSTECTOMY WITH INTRAOPERATIVE CHOLANGIOGRAM;  Surgeon: Lattie Hawichard E Cooper, MD;  Location: [facility] ORS;  Service: General;  Laterality: N/A;  . ESOPHAGOGASTRODUODENOSCOPY (EGD) WITH PROPOFOL N/A 12/12/2016   Procedure: ESOPHAGOGASTRODUODENOSCOPY (EGD) WITH PROPOFOL;  Surgeon: Christena DeemMartin U Skulskie, MD;  [facility];  Service: Endoscopy;  Laterality: N/A;  . GALLBLADDER SURGERY    . LAPAROSCOPIC VAGINAL HYSTERECTOMY WITH SALPINGECTOMY Bilateral 07/07/2016   Procedure: LAPAROSCOPIC ASSISTED VAGINAL HYSTERECTOMY WITH SALPINGECTOMY;  Surgeon: Suzy Bouchardhomas J Schermerhorn, MD;  Location: [facility] ORS;  Service: Gynecology;  Laterality: Bilateral;    Family History  Problem Relation Age of Onset  . Hypertension Mother   . COPD Mother   . Arthritis Mother   . Gallbladder disease Maternal Grandmother   . Heart disease Maternal Grandmother   . Hyperlipidemia Maternal Grandmother   . Intellectual disability Maternal Grandmother   . Hypertension Maternal Grandmother   . Heart attack Maternal Grandmother   . Hypertension Father   . Alcohol abuse Maternal Grandfather   . Cancer Maternal Grandfather   . COPD Maternal Grandfather   . Autoimmune disease Paternal Aunt     Social History   Tobacco Use   . Smoking status: Former Smoker    Quit date: 04/09/1997    Years since quitting: 22.0  . Smokeless tobacco: Never Used  Substance Use Topics  . Alcohol use: No    Current Outpatient Medications:  .  celecoxib (CELEBREX) 200 MG capsule, Take 1 capsule (200 mg total) by mouth 2 (two) times daily., Disp: 60 capsule, Rfl: 2 .  cyclobenzaprine (FLEXERIL) 5 MG tablet, Take 0.5 tablets (2.5 mg total) by mouth 3 (three) times daily as needed for muscle spasms., Disp: 30 tablet, Rfl: 1 .  DULoxetine (CYMBALTA) 30 MG capsule, Take 1 capsule (30 mg total) by mouth daily. Take with the 60 mg capsule to make 90 mg dose per day, Disp: 90 capsule, Rfl: 1 .  naproxen (NAPROSYN) 500 MG tablet, Take 1 tablet (500 mg total) by mouth 2 (two) times daily with a meal. Do not mix with other NSAIDS., Disp: 30 tablet, Rfl: 0 .  ondansetron (ZOFRAN) 4 MG tablet, Take 1 tablet (4 mg total) by mouth every 8 (eight) hours as needed for nausea or vomiting., Disp: 20 tablet, Rfl: 0 .  pantoprazole (PROTONIX) 40 MG tablet, Take 40 mg by mouth daily as needed. , Disp: , Rfl:  .  methylPREDNISolone (MEDROL DOSEPAK) 4 MG TBPK tablet, 6 day dose pack - take as directed, Disp: 21 tablet, Rfl: 0  EXAM:  GENERAL: alert, oriented, appears well and in no acute distress  HEENT: atraumatic, conjunttiva clear, no obvious abnormalities on inspection of external nose and ears  NECK: normal movements of the head and neck  LUNGS: on inspection no signs of respiratory distress, breathing rate appears normal, no obvious gross SOB, gasping or wheezing  CV: no obvious cyanosis  MS: moves all visible extremities without noticeable abnormality  PSYCH/NEURO: pleasant and cooperative, no obvious depression or anxiety, speech and thought processing grossly intact  ASSESSMENT AND PLAN:  Discussed the following assessment and plan:  Cervical, thoracic and low back pain- patient having chronically worsening neck and back pain.  Patient  unable to complete her job even with lifting and weight restrictions.  MRI does reveal disc bulging and stenosis, neurosurgery referral has been placed.  Patient will use muscle relaxer and anti-inflammatory as needed for mild to moderate pain and I have prescribed some tramadol to use for more severe pain.  Patient advised to avoid lifting anything over 10 pounds and try to do stretching throughout the day to help improve pain.  Patient will be taken out of work at this time until we have further evaluation and instruction from neurosurgeon how best to proceed.   I discussed the assessment and treatment plan with the patient. The patient was provided an opportunity to ask questions and all were answered. The patient agreed with the plan and demonstrated an understanding of the instructions.   The patient was advised to call back or seek an in-person evaluation if the symptoms worsen or if the condition fails to improve as anticipated.  Patient advised if she does not hear anything with neurosurgery referral in the next 7 days to call office and let us know.  She will bring short-term disability paperwork to office so we can fill that out.  At this time I will write a general work note that takes her out of work until further notice.   A total of 25  minutes were spent face-to-face with the patient during this encounter and over half of that time was spent on counseling and coordination of care. The patient was counseled on MRI results, how to help control pain, referral to neurosurgeon and how we should proceed forward with her working/being out of work  Jodelle Green, FNP

## 2019-04-22 ENCOUNTER — Encounter: Payer: Self-pay | Admitting: Family Medicine

## 2019-04-22 ENCOUNTER — Telehealth: Payer: Self-pay | Admitting: Family Medicine

## 2019-04-22 DIAGNOSIS — Z0279 Encounter for issue of other medical certificate: Secondary | ICD-10-CM

## 2019-04-22 NOTE — Telephone Encounter (Signed)
Pt came in dropped off paperwork need to be signed. It will be in color folder up front. Thank you!

## 2019-04-25 NOTE — Telephone Encounter (Signed)
Called and spoke to Pt she is coming to the office to pick up the form today

## 2019-04-25 NOTE — Telephone Encounter (Signed)
Paperwork was done Friday and given to you  Thanks  LG 

## 2019-04-25 NOTE — Telephone Encounter (Signed)
Called Pt and told her the disability form she dropped off is ready for pick up.

## 2019-04-25 NOTE — Telephone Encounter (Signed)
Paperwork was done Friday and given to you  Thanks  LG

## 2019-04-26 ENCOUNTER — Encounter: Payer: Self-pay | Admitting: Podiatry

## 2019-04-26 ENCOUNTER — Other Ambulatory Visit: Payer: Self-pay

## 2019-04-26 ENCOUNTER — Ambulatory Visit (INDEPENDENT_AMBULATORY_CARE_PROVIDER_SITE_OTHER): Payer: BC Managed Care – PPO | Admitting: Podiatry

## 2019-04-26 VITALS — Temp 97.3°F

## 2019-04-26 DIAGNOSIS — M7752 Other enthesopathy of left foot: Secondary | ICD-10-CM | POA: Diagnosis not present

## 2019-04-26 DIAGNOSIS — M205X2 Other deformities of toe(s) (acquired), left foot: Secondary | ICD-10-CM

## 2019-04-26 NOTE — Telephone Encounter (Signed)
error 

## 2019-04-27 NOTE — Progress Notes (Signed)
   HPI: 43 year old female presenting today for follow up evaluation of hallux limitus left. She reports continued pain. She also reports a painful callus lesion of the right foot. Walking and wearing shoes increases her pain. She has been taking Celebrex for treatment. Patient is here for further evaluation and treatment.   Past Medical History:  Diagnosis Date  . Arthritis    low back and neck  . Back pain of lumbar region with sciatica   . Chronic kidney disease    kidney stones and infection  . Degenerative disc disease, cervical   . Family history of adverse reaction to anesthesia    "unable to put mother to sleep"  . Frequent headaches   . GERD (gastroesophageal reflux disease)   . Lichen   . Menorrhagia   . Ovarian cyst      Physical Exam: General: The patient is alert and oriented x3 in no acute distress.  Dermatology: Skin is warm, dry and supple bilateral lower extremities. Negative for open lesions or macerations.  Vascular: Palpable pedal pulses bilaterally. No edema or erythema noted. Capillary refill within normal limits.  Neurological: Epicritic and protective threshold grossly intact bilaterally.   Musculoskeletal Exam: Pain on palpation with limited range of motion noted to the first MPJ left foot.  Assessment: 1. DJD/Hallux limitus left    Plan of Care:  1. Patient evaluated.  2. Post op shoe dispensed.  3. Patient currently suffering from neck, back and right hip pain. She is going to follow up with neuro spine specialist prior to further treatment of the foot.  4. Continue taking Celebrex as needed.  5. Return to clinic as needed.   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       Edrick Kins, DPM [facility]  Dr. Edrick Kins, DPM    [address], [place] [address]                Office [phone]  Fax [phone]

## 2019-05-05 ENCOUNTER — Encounter: Payer: Self-pay | Admitting: Family Medicine

## 2019-05-05 NOTE — Telephone Encounter (Signed)
Questions about referral update?

## 2019-05-09 ENCOUNTER — Encounter: Payer: Self-pay | Admitting: Family Medicine

## 2019-05-10 ENCOUNTER — Encounter: Payer: Self-pay | Admitting: Family Medicine

## 2019-05-10 DIAGNOSIS — M5412 Radiculopathy, cervical region: Secondary | ICD-10-CM

## 2019-05-10 DIAGNOSIS — G8929 Other chronic pain: Secondary | ICD-10-CM

## 2019-05-11 NOTE — Telephone Encounter (Signed)
Hey [name] -- I could have sworn I put in neurosurgery referral on this patient, but I dont see it now. I may be missing it. I put it in again just in case.  Thanks!

## 2019-05-16 ENCOUNTER — Encounter: Payer: Self-pay | Admitting: Family Medicine

## 2019-05-17 ENCOUNTER — Telehealth: Payer: Self-pay

## 2019-05-17 NOTE — Telephone Encounter (Signed)
Had to have my supervising MD review/co-sign. He is gone for the day, will follow up tomorrow.

## 2019-05-17 NOTE — Telephone Encounter (Signed)
Called Pt and told her that the Dr that needed to sign her form was not here today, but will be tomorrow. Told Pt we will call her when it is signed so she can come pick it up. Pt stated ok.

## 2019-05-17 NOTE — Telephone Encounter (Signed)
Pt called [facility] [employer]: Pt inquired if her disability paperwork was ready for pick up/ please advise

## 2019-05-17 NOTE — Telephone Encounter (Signed)
Copied from [place] [id number]. Topic: General - Inquiry >> May 17, 2019  3:10 PM Alanda Slim E wrote: Reason for CRM: Pt inquired if her disability paperwork was ready for pick up/ please advise

## 2019-05-19 ENCOUNTER — Telehealth: Payer: Self-pay | Admitting: Lab

## 2019-05-19 NOTE — Telephone Encounter (Signed)
Called Pt No answer No VM was calling her back to tell her the Disability forms is not ready yet, and we will call her once Dr. Caryl Bis finish filling them out

## 2019-05-19 NOTE — Telephone Encounter (Signed)
Pt is aware calling back checking on disability form

## 2019-05-19 NOTE — Telephone Encounter (Signed)
Called Pt No answer No VM was calling her back to tell her the Disability forms is not ready yet, and we will call her once Dr. Sonnenberg finish filling them out 

## 2019-05-20 NOTE — Telephone Encounter (Signed)
Pt is aware of the below message 

## 2019-05-23 ENCOUNTER — Encounter: Payer: Self-pay | Admitting: Family Medicine

## 2019-05-26 NOTE — Telephone Encounter (Signed)
Pt called to check status on paperwork

## 2019-05-27 NOTE — Telephone Encounter (Signed)
Paper is completed and in front office folderfor Pt to pick up on Monday 05/30/19

## 2019-05-27 NOTE — Telephone Encounter (Signed)
Pt called [facility] Pt called to check status on paperwork

## 2019-06-02 DIAGNOSIS — M25551 Pain in right hip: Secondary | ICD-10-CM | POA: Diagnosis not present

## 2019-06-02 DIAGNOSIS — G959 Disease of spinal cord, unspecified: Secondary | ICD-10-CM | POA: Diagnosis not present

## 2019-06-03 ENCOUNTER — Other Ambulatory Visit: Payer: Self-pay | Admitting: Family Medicine

## 2019-06-03 DIAGNOSIS — M533 Sacrococcygeal disorders, not elsewhere classified: Secondary | ICD-10-CM | POA: Diagnosis not present

## 2019-06-03 DIAGNOSIS — M25551 Pain in right hip: Secondary | ICD-10-CM | POA: Diagnosis not present

## 2019-06-03 DIAGNOSIS — M5136 Other intervertebral disc degeneration, lumbar region: Secondary | ICD-10-CM | POA: Diagnosis not present

## 2019-06-03 DIAGNOSIS — M47816 Spondylosis without myelopathy or radiculopathy, lumbar region: Secondary | ICD-10-CM | POA: Diagnosis not present

## 2019-06-07 ENCOUNTER — Other Ambulatory Visit: Payer: Self-pay

## 2019-06-09 ENCOUNTER — Encounter: Payer: Self-pay | Admitting: Family Medicine

## 2019-06-09 ENCOUNTER — Other Ambulatory Visit: Payer: Self-pay

## 2019-06-09 ENCOUNTER — Telehealth (INDEPENDENT_AMBULATORY_CARE_PROVIDER_SITE_OTHER): Payer: BC Managed Care – PPO | Admitting: Family Medicine

## 2019-06-09 VITALS — BP 112/88 | HR 98 | Temp 97.7°F | Resp 18 | Ht 63.0 in | Wt 167.6 lb

## 2019-06-09 DIAGNOSIS — M5412 Radiculopathy, cervical region: Secondary | ICD-10-CM

## 2019-06-09 DIAGNOSIS — M545 Low back pain: Secondary | ICD-10-CM

## 2019-06-09 DIAGNOSIS — M546 Pain in thoracic spine: Secondary | ICD-10-CM

## 2019-06-09 DIAGNOSIS — G8929 Other chronic pain: Secondary | ICD-10-CM

## 2019-06-09 DIAGNOSIS — M25551 Pain in right hip: Secondary | ICD-10-CM | POA: Diagnosis not present

## 2019-06-09 NOTE — Progress Notes (Signed)
Subjective:    Patient ID: Christine ShiversAnita L Simmons, female    DOB: 07/29/1976, 43 y.o.   MRN: [id number]  HPI   Patient presents to clinic for follow-up on neck, mid and low back pain.  She was able to see neurosurgery.  They do do injections in low back/right hip in hopes of helping pain.  Has planned injections for her neck as well next week.  She will be starting physical therapy.  Per patient she was told by neurosurgeon that if this approach does not help her pain, next step will most likely be surgery.  Patient states neurosurgeon told her short-term disability could potentially be extended to long-term depending on how these injections go.  Patient is hopeful that long-term disability will be initiated by neurosurgeon because doing any sort of movement for longer periods of time causes patient to have a lot of pain.  Currently uses ibuprofen and Tylenol as needed.  Does have some tramadol at her home that was prescribed to her back in June when going to the ER, but really does not like to take any stronger pain medicines as they make her feel sleepy.  Also heating pads on sore areas do somewhat help pain.  Patient Active Problem List   Diagnosis Date Noted  . Degenerative disc disease, cervical 03/31/2019  . Menorrhagia with regular cycle 03/31/2019  . Pelvic pain in female 03/31/2019  . Cervical radiculopathy 08/10/2018  . Chronic bilateral low back pain 08/10/2018  . Chronic bilateral thoracic back pain 08/10/2018  . Family history of metabolic and nutritional disorder 08/10/2018  . Fatigue 08/10/2018  . Post-operative state 07/07/2016  . Cholelithiasis 04/10/2015  . Lichen 03/23/2014   Social History   Tobacco Use  . Smoking status: Former Smoker    Quit date: 04/09/1997    Years since quitting: 22.1  . Smokeless tobacco: Never Used  Substance Use Topics  . Alcohol use: No   Review of Systems  Constitutional: Negative for chills, fatigue and fever.  HENT: Negative for  congestion, ear pain, sinus pain and sore throat.   Eyes: Negative.   Respiratory: Negative for cough, shortness of breath and wheezing.   Cardiovascular: Negative for chest pain, palpitations and leg swelling.  Gastrointestinal: Negative for abdominal pain, diarrhea, nausea and vomiting.  Genitourinary: Negative for dysuria, frequency and urgency.  Musculoskeletal: +chronic neck, thoracic and lumbar pain Skin: Negative for color change, pallor and rash.  Neurological: Negative for syncope, light-headedness and headaches.  Psychiatric/Behavioral: The patient is not nervous/anxious.       Objective:   Physical Exam Vitals signs and nursing note reviewed.  Constitutional:      General: She is not in acute distress.    Appearance: She is not ill-appearing, toxic-appearing or diaphoretic.  HENT:     Head: Normocephalic and atraumatic.  Eyes:     General: No scleral icterus.    Extraocular Movements: Extraocular movements intact.     Pupils: Pupils are equal, round, and reactive to light.  Cardiovascular:     Rate and Rhythm: Normal rate and regular rhythm.  Pulmonary:     Effort: Pulmonary effort is normal. No respiratory distress.     Breath sounds: Normal breath sounds.  Musculoskeletal:     Comments: Tenderness in neck, T-spine and L-spine with palpation.  Movements of neck and back are all stiff.  With slight limp favoring right leg due to pain radiating down right lower extremity related to and right hip pain.  Neurological:  Mental Status: She is alert and oriented to person, place, and time.  Psychiatric:        Mood and Affect: Mood normal.        Behavior: Behavior normal.     Today's Vitals   06/09/19 1131  BP: 112/88  Pulse: 98  Resp: 18  Temp: 97.7 F (36.5 C)  TempSrc: Temporal  SpO2: 98%  Weight: 167 lb 9.6 oz (76 kg)  Height: 5\' 3"  (1.6 m)   Body mass index is 29.69 kg/m.     Assessment & Plan:    A total of 15  minutes were spent face-to-face  with the patient during this encounter and over half of that time was spent on counseling and coordination of care. The patient was counseled on strategies for pain relief, exercises, encouraged to continue forward with injections and physical therapy as recommended by neurosurgery.    Chronic neck, thoracic, lumbar spine pain, chronic hip pain -- she will continue to follow with neurosurgery.  She will go forward with plan for injections and physical therapy with hopes of improving pain.  Patient advised that if long-term disability ends up being required, this declaration will have to be given by the neurosurgeon.  Patient is hopeful that the injections and therapy will help control her pain but does not have high hopes her being able to return to her job working at a [employer] because her job does require being on feet for many hours per day and heavy lifting.  She will continue Cymbalta to help control overall pain.  She will use ibuprofen or Tylenol as needed.  Advised that she can try taking half of the 50 mg tramadol tablet as needed for episodes of more severe pain, declines new prescription as she has leftover tablets from when she went to the ER back in June 2020.  Patient also advised that flu vaccines will most likely be available around September 2020  Patient will follow-up here in our office in approximately 2 to 3 months for recheck on chronic medical conditions.  She will call us anytime if issues arise.

## 2019-06-15 DIAGNOSIS — M6281 Muscle weakness (generalized): Secondary | ICD-10-CM | POA: Diagnosis not present

## 2019-06-15 DIAGNOSIS — M542 Cervicalgia: Secondary | ICD-10-CM | POA: Diagnosis not present

## 2019-06-27 DIAGNOSIS — M542 Cervicalgia: Secondary | ICD-10-CM | POA: Diagnosis not present

## 2019-06-27 DIAGNOSIS — M5412 Radiculopathy, cervical region: Secondary | ICD-10-CM | POA: Diagnosis not present

## 2019-06-27 DIAGNOSIS — M5030 Other cervical disc degeneration, unspecified cervical region: Secondary | ICD-10-CM | POA: Diagnosis not present

## 2019-06-30 ENCOUNTER — Telehealth: Payer: Self-pay | Admitting: Family Medicine

## 2019-06-30 NOTE — Telephone Encounter (Signed)
Patient returned [place] call.  Informed patient that paperwork for disability was received from [place] however if long-term disability is needed forms need to be completed by a specialist.  Patient voiced understanding with no questions or concerns.

## 2019-06-30 NOTE — Telephone Encounter (Signed)
We received paperwork from [facility] in regard to her function/disability  This should be filled out by specialist  We did short term disability originally, but I told patient if long term disability ended up being needed --that comes from specialist

## 2019-06-30 NOTE — Telephone Encounter (Signed)
Called Pt No answer, left VM to call office.  

## 2019-07-14 ENCOUNTER — Other Ambulatory Visit: Payer: Self-pay | Admitting: Neurosurgery

## 2019-07-14 DIAGNOSIS — G959 Disease of spinal cord, unspecified: Secondary | ICD-10-CM | POA: Diagnosis not present

## 2019-07-28 ENCOUNTER — Telehealth: Payer: BC Managed Care – PPO | Admitting: Physician Assistant

## 2019-07-28 DIAGNOSIS — R300 Dysuria: Secondary | ICD-10-CM | POA: Diagnosis not present

## 2019-07-28 MED ORDER — CEPHALEXIN 500 MG PO CAPS: 500.0000 mg | ORAL_CAPSULE | Freq: Two times a day (BID) | ORAL | 0 refills | Status: DC

## 2019-07-28 NOTE — Progress Notes (Signed)
I have spent 5 minutes in review of e-visit questionnaire, review and updating patient chart, medical decision making and response to patient.   Amandalee Lacap Cody Sadaf Przybysz, PA-C    

## 2019-07-28 NOTE — Progress Notes (Signed)

## 2019-07-29 ENCOUNTER — Other Ambulatory Visit: Payer: Self-pay

## 2019-07-29 ENCOUNTER — Encounter
Admission: RE | Admit: 2019-07-29 | Discharge: 2019-07-29 | Disposition: A | Discharge status: Home / Self Care | Payer: BC Managed Care – PPO | Source: Ambulatory Visit | Attending: Neurosurgery | Admitting: Neurosurgery

## 2019-07-29 DIAGNOSIS — G959 Disease of spinal cord, unspecified: Secondary | ICD-10-CM | POA: Diagnosis not present

## 2019-07-29 DIAGNOSIS — Z20828 Contact with and (suspected) exposure to other viral communicable diseases: Secondary | ICD-10-CM | POA: Insufficient documentation

## 2019-07-29 DIAGNOSIS — Z01812 Encounter for preprocedural laboratory examination: Secondary | ICD-10-CM | POA: Insufficient documentation

## 2019-07-29 HISTORY — DX: Cervicalgia: M54.2

## 2019-07-29 HISTORY — DX: Anxiety disorder, unspecified: F41.9

## 2019-07-29 HISTORY — DX: Endometriosis, unspecified: N80.9

## 2019-07-29 HISTORY — DX: Low back pain, unspecified: M54.50

## 2019-07-29 LAB — CBC
HCT: 40.7 % (ref 36.0–46.0)
Hemoglobin: 13.5 g/dL (ref 12.0–15.0)
MCH: 29.4 pg (ref 26.0–34.0)
MCHC: 33.2 g/dL (ref 30.0–36.0)
MCV: 88.7 fL (ref 80.0–100.0)
Platelets: 262 10*3/uL (ref 150–400)
RBC: 4.59 MIL/uL (ref 3.87–5.11)
RDW: 12.2 % (ref 11.5–15.5)
WBC: 8.3 10*3/uL (ref 4.0–10.5)
nRBC: 0 % (ref 0.0–0.2)

## 2019-07-29 LAB — URINALYSIS, ROUTINE W REFLEX MICROSCOPIC
Bilirubin Urine: NEGATIVE
Glucose, UA: NEGATIVE mg/dL
Ketones, ur: NEGATIVE mg/dL
Nitrite: NEGATIVE
Protein, ur: NEGATIVE mg/dL
Specific Gravity, Urine: 1.017 (ref 1.005–1.030)
WBC, UA: >50 WBC/hpf — ABNORMAL HIGH (ref 0–5)
pH: 5 (ref 5.0–8.0)

## 2019-07-29 LAB — TYPE AND SCREEN
ABO/RH(D): O POS
Antibody Screen: NEGATIVE

## 2019-07-29 LAB — PROTIME-INR
INR: 1 (ref 0.8–1.2)
Prothrombin Time: 13 seconds (ref 11.4–15.2)

## 2019-07-29 LAB — BASIC METABOLIC PANEL
Anion gap: 9 (ref 5–15)
BUN: 11 mg/dL (ref 6–20)
CO2: 23 mmol/L (ref 22–32)
Calcium: 9.1 mg/dL (ref 8.9–10.3)
Chloride: 105 mmol/L (ref 98–111)
Creatinine, Ser: 0.65 mg/dL (ref 0.44–1.00)
GFR calc Af Amer: >60 mL/min (ref >60)
GFR calc non Af Amer: >60 mL/min (ref >60)
Glucose, Bld: 120 mg/dL — ABNORMAL HIGH (ref 70–99)
Potassium: 3.9 mmol/L (ref 3.5–5.1)
Sodium: 137 mmol/L (ref 135–145)

## 2019-07-29 LAB — SURGICAL PCR SCREEN
MRSA, PCR: NEGATIVE
Staphylococcus aureus: NEGATIVE

## 2019-07-29 LAB — APTT: aPTT: 28 seconds (ref 24–36)

## 2019-07-29 NOTE — Patient Instructions (Signed)
Your procedure is scheduled on: 08/03/2019 Wed Report to Same Day Surgery 2nd floor medical mall [facility] Entrance-take elevator on left to 2nd floor.  Check in with surgery information desk.) To find out your arrival time please call [phone] between 1PM - 3PM on 08/02/2019 Tues  Remember: Instructions that are not followed completely may result in serious medical risk, up to and including death, or upon the discretion of your surgeon and anesthesiologist your surgery may need to be rescheduled.    _x___ 1. Do not eat food after midnight the night before your procedure. You may drink clear liquids up to 2 hours before you are scheduled to arrive at the hospital for your procedure.  Do not drink clear liquids within 2 hours of your scheduled arrival to the hospital.  Clear liquids include  --Water or Apple juice without pulp  --Clear carbohydrate beverage such as ClearFast or Gatorade  --Black Coffee or Clear Tea (No milk, no creamers, do not add anything to                  the coffee or Tea Type 1 and type 2 diabetics should only drink water.   ____Ensure clear carbohydrate drink on the way to the hospital for bariatric patients  ____Ensure clear carbohydrate drink 3 hours before surgery.   No gum chewing or hard candies.     __x__ 2. No Alcohol for 24 hours before or after surgery.   __x__3. No Smoking or e-cigarettes for 24 prior to surgery.  Do not use any chewable tobacco products for at least 6 hour prior to surgery   ____  4. Bring all medications with you on the day of surgery if instructed.    __x__ 5. Notify your doctor if there is any change in your medical condition     (cold, fever, infections).    x___6. On the morning of surgery brush your teeth with toothpaste and water.  You may rinse your mouth with mouth wash if you wish.  Do not swallow any toothpaste or mouthwash.   Do not wear jewelry, make-up, hairpins, clips or nail polish.  Do not wear lotions,  powders, or perfumes. You may wear deodorant.  Do not shave 48 hours prior to surgery. Men may shave face and neck.  Do not bring valuables to the hospital.    [facility] is not responsible for any belongings or valuables.               Contacts, dentures or bridgework may not be worn into surgery.  Leave your suitcase in the car. After surgery it may be brought to your room.  For patients admitted to the hospital, discharge time is determined by your                       treatment team.  _  Patients discharged the day of surgery will not be allowed to drive home.  You will need someone to drive you home and stay with you the night of your procedure.    Please read over the following fact sheets that you were given:   [facility] Preparing for Surgery and or MRSA Information   _x___ Take anti-hypertensive listed below, cardiac, seizure, asthma,     anti-reflux and psychiatric medicines. These include:  1. DULoxetine (CYMBALTA  2.pantoprazole (PROTONIX) 40 MG tablet  3.  4.  5.  6.  ____Fleets enema or Magnesium Citrate as directed.  _x___ Use CHG Soap or sage wipes as directed on instruction sheet   ____ Use inhalers on the day of surgery and bring to hospital day of surgery  ____ Stop Metformin and Janumet 2 days prior to surgery.    ____ Take 1/2 of usual insulin dose the night before surgery and none on the morning     surgery.   _x___ Follow recommendations from Cardiologist, Pulmonologist or PCP regarding          stopping Aspirin, Coumadin, Plavix ,Eliquis, Effient, or Pradaxa, and Pletal.  X____Stop Anti-inflammatories such as Advil, Aleve, Ibuprofen, Motrin, Naproxen, Naprosyn, Goodies powders or aspirin products. OK to take Tylenol and                          Celebrex.   _x___ Stop supplements until after surgery.  But may continue Vitamin D, Vitamin B,       and multivitamin.   ____ Bring C-Pap to the hospital.    

## 2019-07-30 DIAGNOSIS — G959 Disease of spinal cord, unspecified: Secondary | ICD-10-CM | POA: Diagnosis not present

## 2019-07-30 LAB — SARS CORONAVIRUS 2 (TAT 6-24 HRS): SARS Coronavirus 2: NEGATIVE

## 2019-08-03 ENCOUNTER — Encounter
Admission: RE | Disposition: A | Discharge status: Home / Self Care | Payer: Self-pay | Source: Home / Self Care | Attending: Neurosurgery

## 2019-08-03 ENCOUNTER — Ambulatory Visit: Payer: BC Managed Care – PPO | Admitting: Anesthesiology

## 2019-08-03 ENCOUNTER — Other Ambulatory Visit: Payer: Self-pay

## 2019-08-03 ENCOUNTER — Encounter: Payer: Self-pay | Admitting: *Deleted

## 2019-08-03 ENCOUNTER — Observation Stay
Admission: RE | Admit: 2019-08-03 | Discharge: 2019-08-04 | Disposition: A | Discharge status: Home / Self Care | Payer: BC Managed Care – PPO | Attending: Neurosurgery | Admitting: Neurosurgery

## 2019-08-03 ENCOUNTER — Ambulatory Visit: Payer: BC Managed Care – PPO

## 2019-08-03 ENCOUNTER — Observation Stay: Payer: BC Managed Care – PPO

## 2019-08-03 DIAGNOSIS — K219 Gastro-esophageal reflux disease without esophagitis: Secondary | ICD-10-CM | POA: Diagnosis not present

## 2019-08-03 DIAGNOSIS — Z87891 Personal history of nicotine dependence: Secondary | ICD-10-CM | POA: Insufficient documentation

## 2019-08-03 DIAGNOSIS — Z981 Arthrodesis status: Secondary | ICD-10-CM | POA: Diagnosis not present

## 2019-08-03 DIAGNOSIS — M4712 Other spondylosis with myelopathy, cervical region: Secondary | ICD-10-CM | POA: Diagnosis not present

## 2019-08-03 DIAGNOSIS — F419 Anxiety disorder, unspecified: Secondary | ICD-10-CM | POA: Insufficient documentation

## 2019-08-03 DIAGNOSIS — Z882 Allergy status to sulfonamides status: Secondary | ICD-10-CM | POA: Diagnosis not present

## 2019-08-03 DIAGNOSIS — M4802 Spinal stenosis, cervical region: Secondary | ICD-10-CM | POA: Diagnosis not present

## 2019-08-03 DIAGNOSIS — G959 Disease of spinal cord, unspecified: Secondary | ICD-10-CM | POA: Diagnosis not present

## 2019-08-03 DIAGNOSIS — Z793 Long term (current) use of hormonal contraceptives: Secondary | ICD-10-CM | POA: Insufficient documentation

## 2019-08-03 DIAGNOSIS — Z79899 Other long term (current) drug therapy: Secondary | ICD-10-CM | POA: Insufficient documentation

## 2019-08-03 DIAGNOSIS — Z419 Encounter for procedure for purposes other than remedying health state, unspecified: Secondary | ICD-10-CM

## 2019-08-03 HISTORY — PX: CERVICAL DISC ARTHROPLASTY: SHX587

## 2019-08-03 LAB — ABO/RH: ABO/RH(D): O POS

## 2019-08-03 IMAGING — CR DG CERVICAL SPINE 2 OR 3 VIEWS
2 series · 2 of 2 positions shown · non-contrast
Comparison: Intraoperative films from earlier in the same day.

CLINICAL DATA: Status post cervical disc arthroplasty

EXAM:
CERVICAL SPINE - 2-3 VIEW

[c-spine lat]
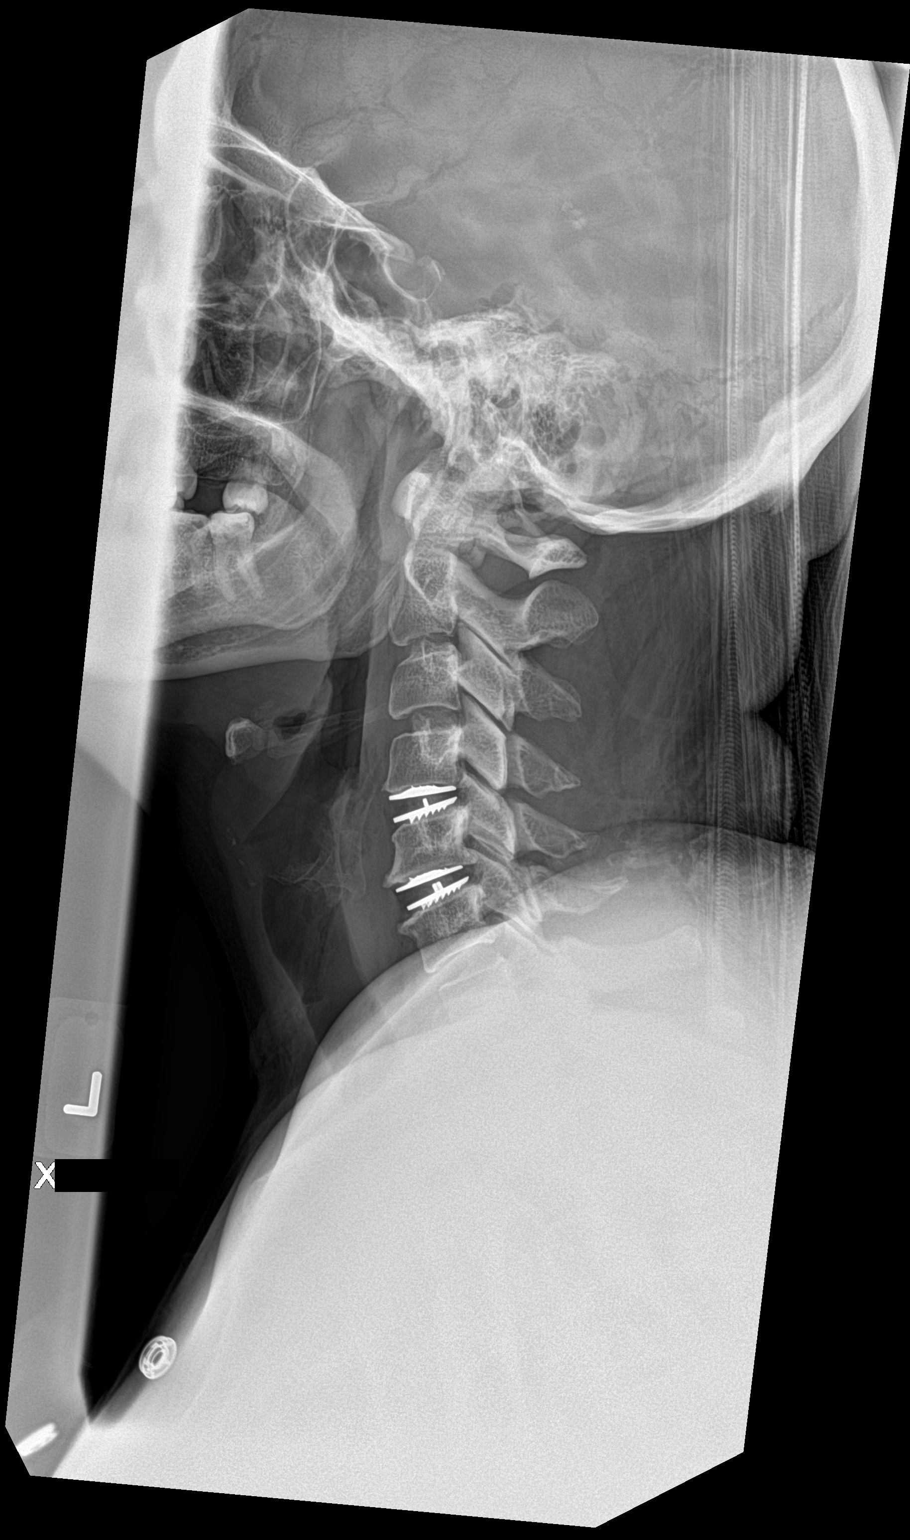

[c-spine ap]
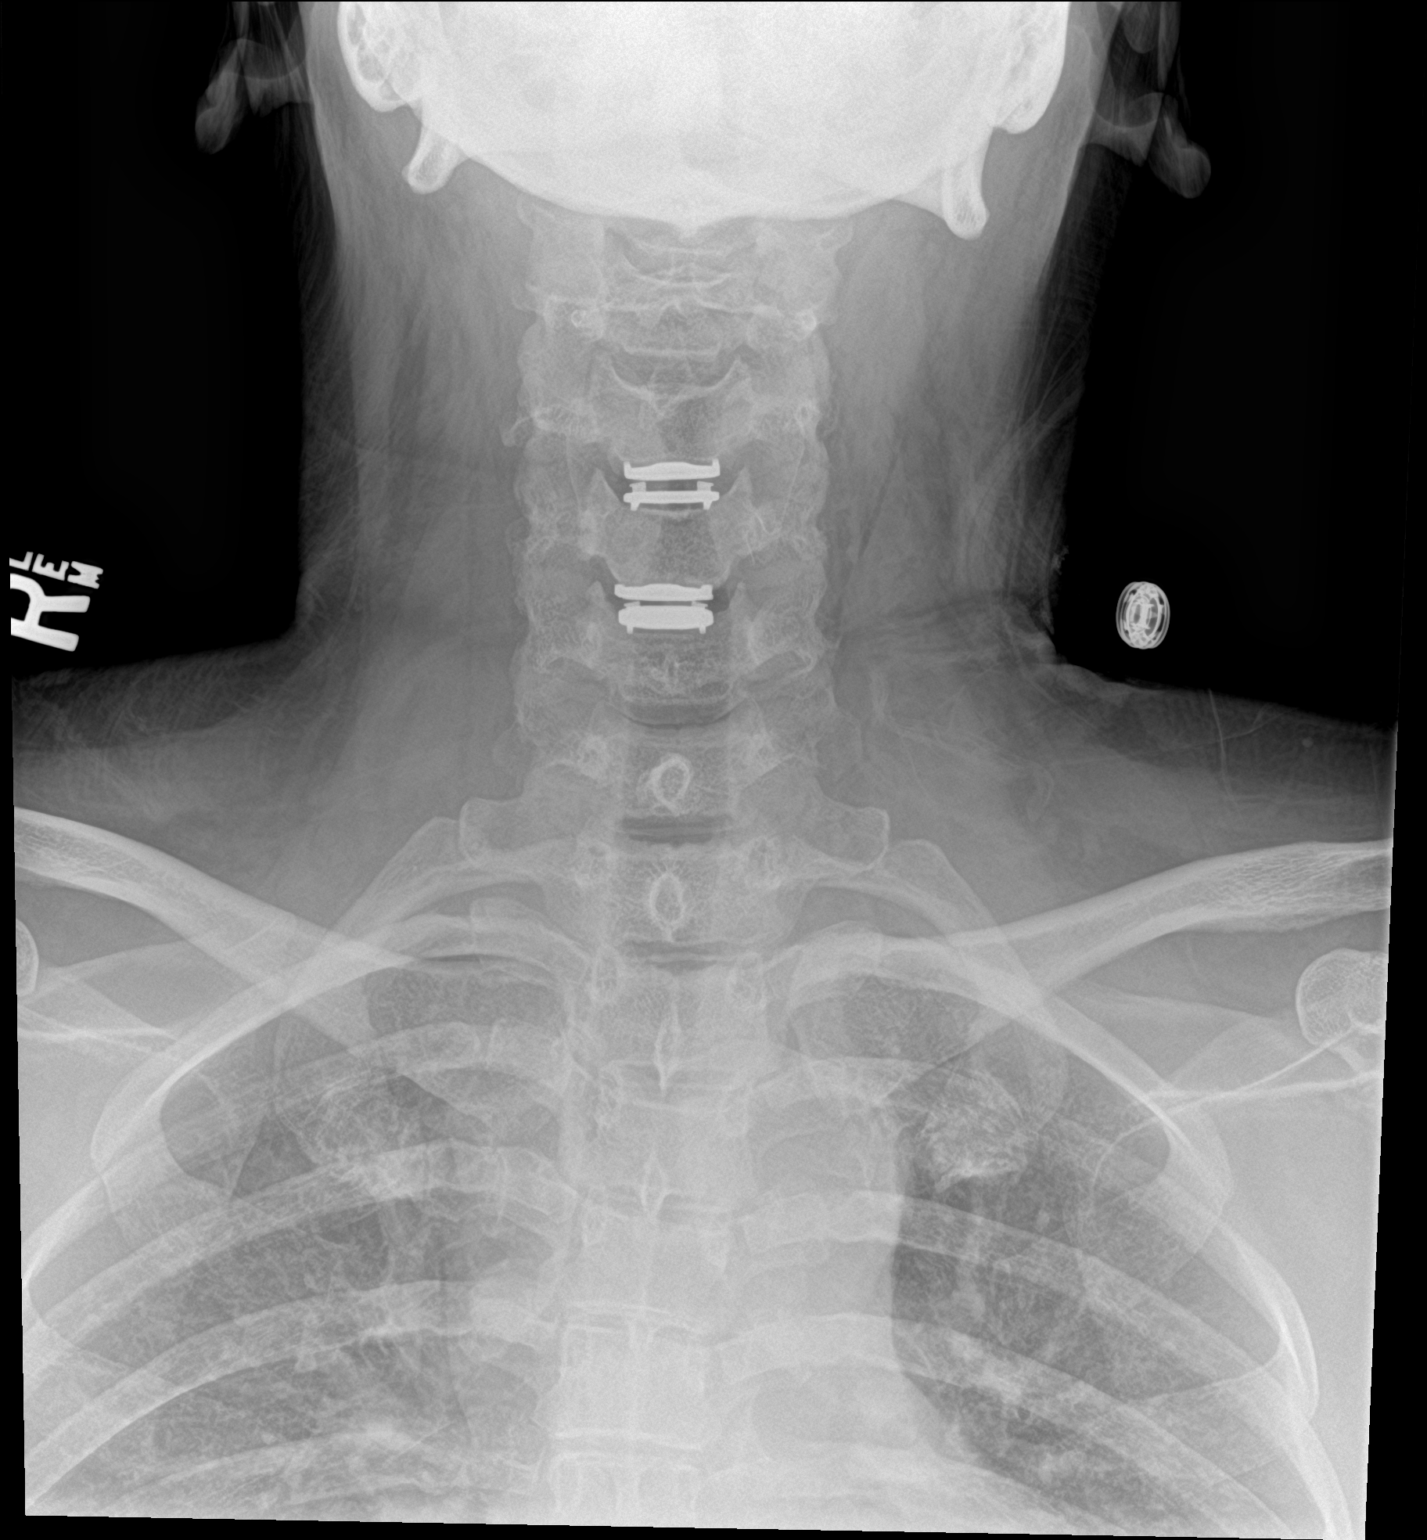

[2 of 2 positions shown; findings below may reference images not displayed]

FINDINGS: Changes consistent with disc arthroplasty at C4-5 and C5-6. No acute
bony or soft tissue abnormality is noted.
IMPRESSION: Status post disc arthroplasty at C4-5 and C5-6.

## 2019-08-03 IMAGING — RF DG CERVICAL SPINE 2 OR 3 VIEWS
1 series · 8 of 8 positions shown · non-contrast
Comparison: None.

CLINICAL DATA: Anterior cervical surgery

EXAM:
CERVICAL SPINE - 2-3 VIEW; DG C-ARM 1-60 MIN

[Series 1: unknown protocol · 0.14mm/px · 8 of 8 slices shown]
[im 1/8]
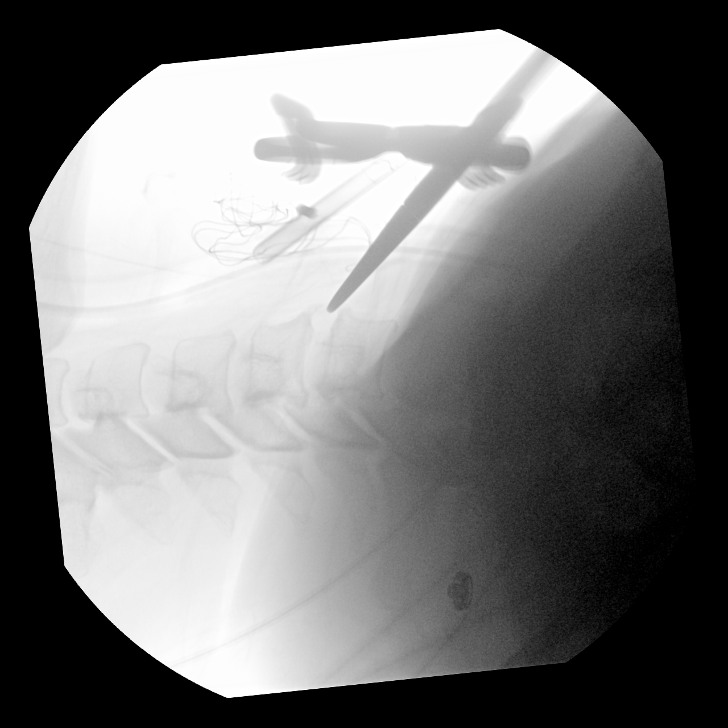
[im 2/8]
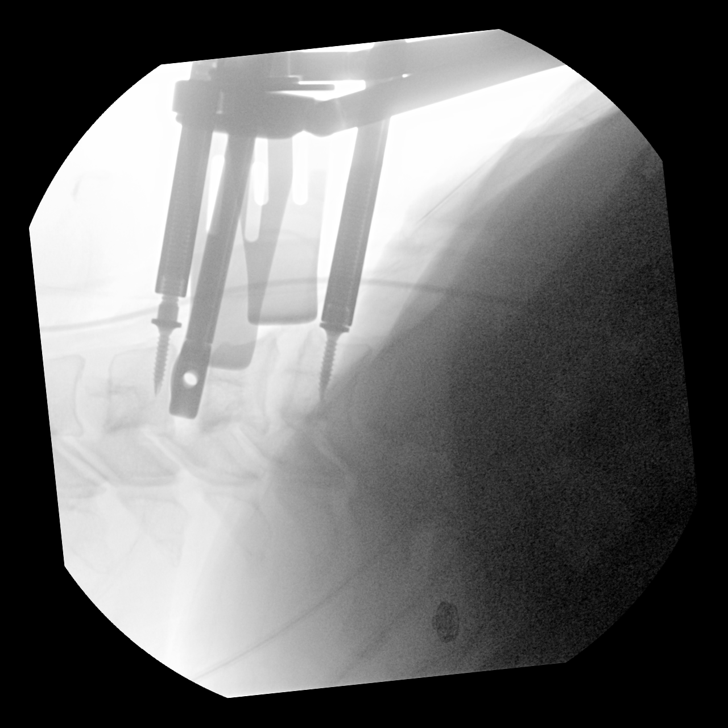
[im 3/8]
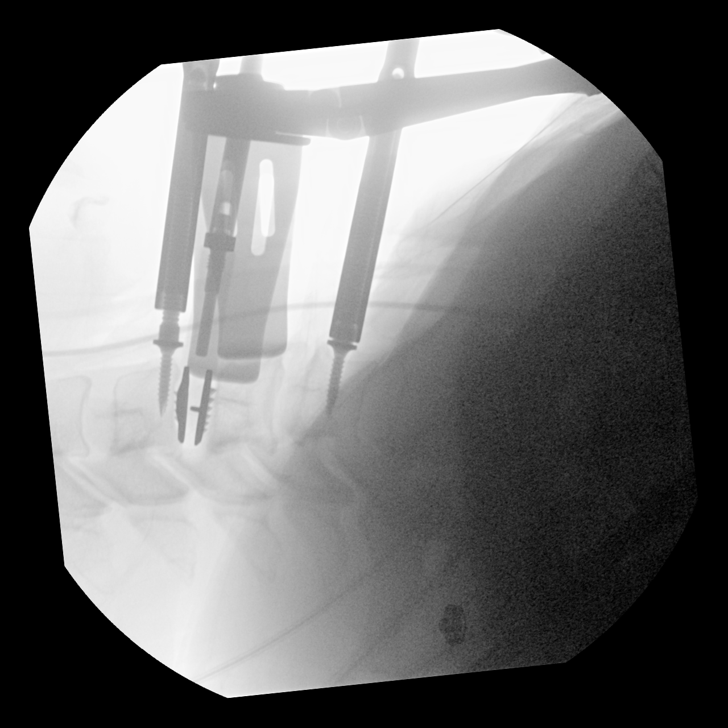
[im 4/8]
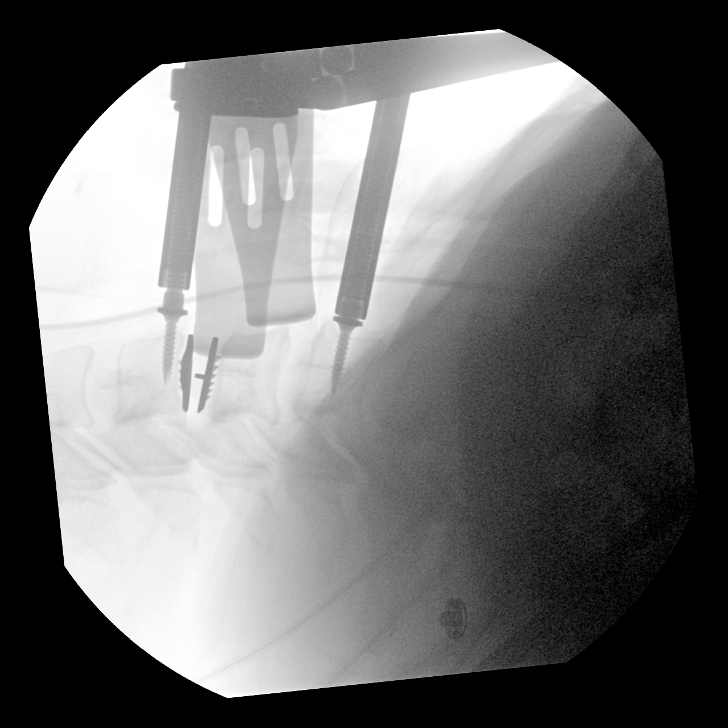
[im 5/8]
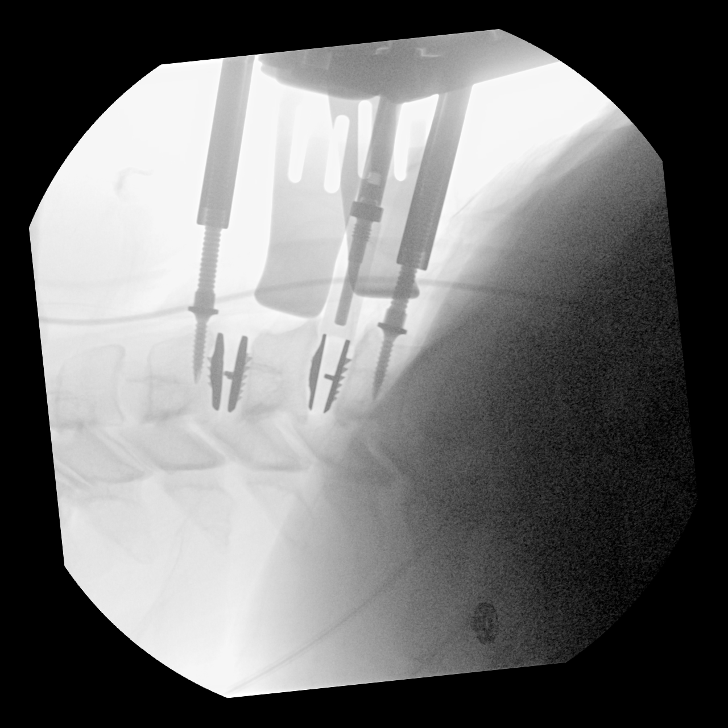
[im 6/8]
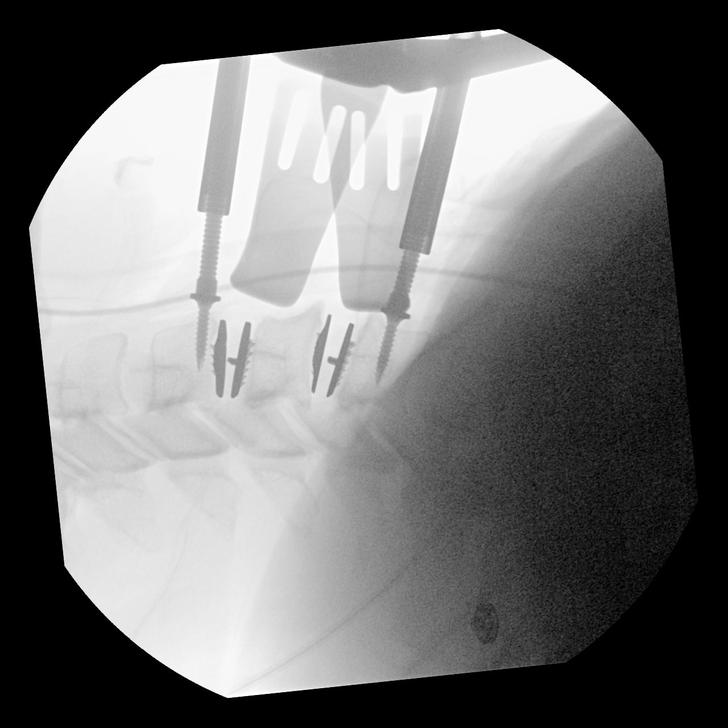
[im 7/8]
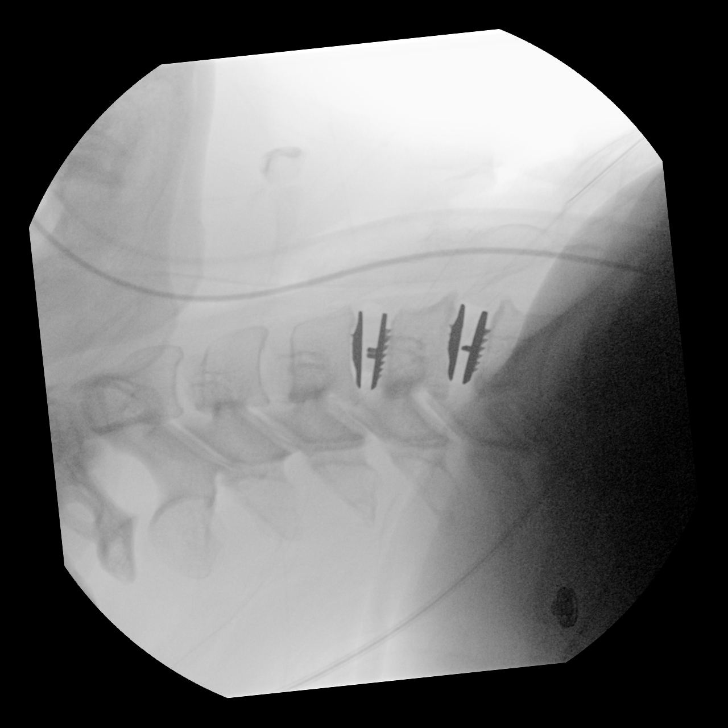
[im 8/8]
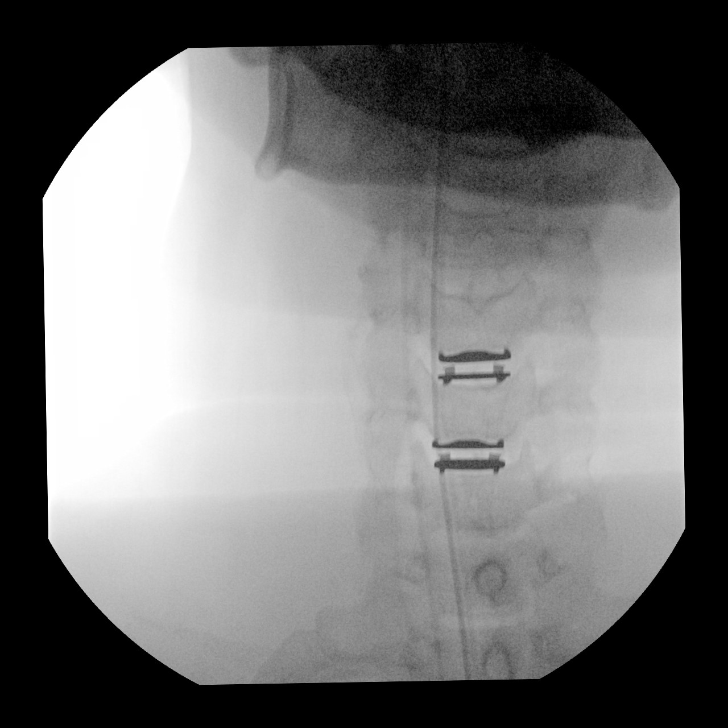

[8 of 8 positions shown; findings below may reference images not displayed]

FINDINGS: Multiple intraoperative fluoroscopic spot images are provided.
Interbody prosthesis placement at C4-5 and C5-6 in satisfactory
position.
IMPRESSION: Interbody prosthesis placement at C4-5 and C5-6.

## 2019-08-03 IMAGING — RF DG C-ARM 1-60 MIN
1 series · 8 of 8 positions shown · non-contrast
Comparison: None.

CLINICAL DATA: Anterior cervical surgery

EXAM:
CERVICAL SPINE - 2-3 VIEW; DG C-ARM 1-60 MIN

[Series 1: unknown protocol · 0.14mm/px · 8 of 8 slices shown]
[im 1/8]
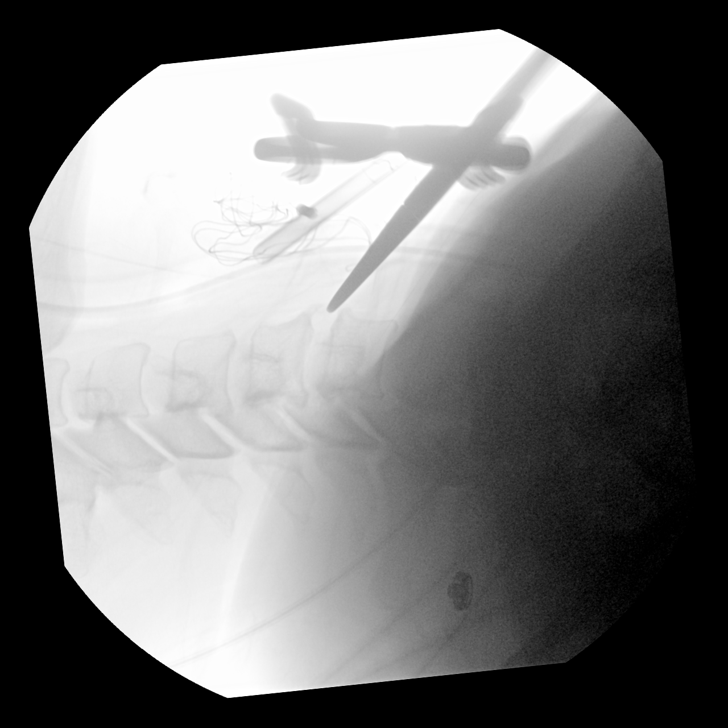
[im 2/8]
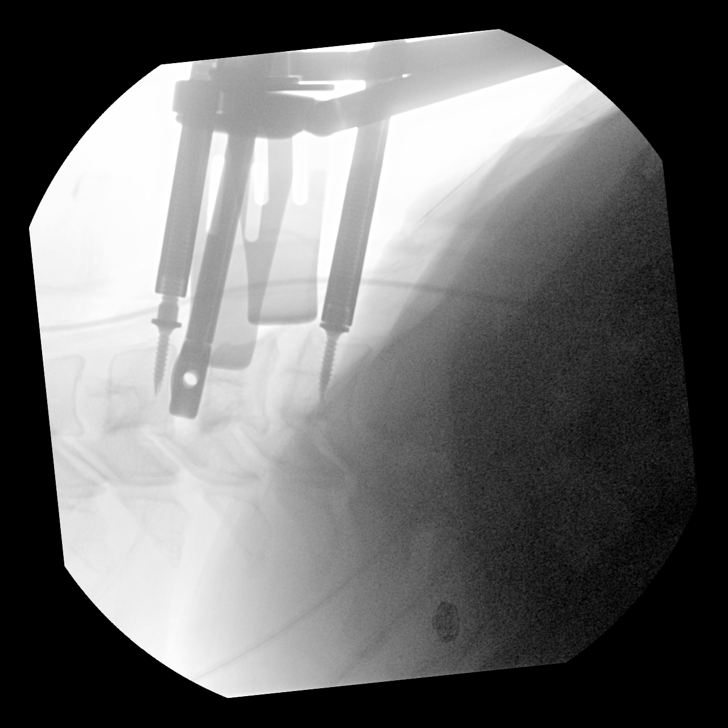
[im 3/8]
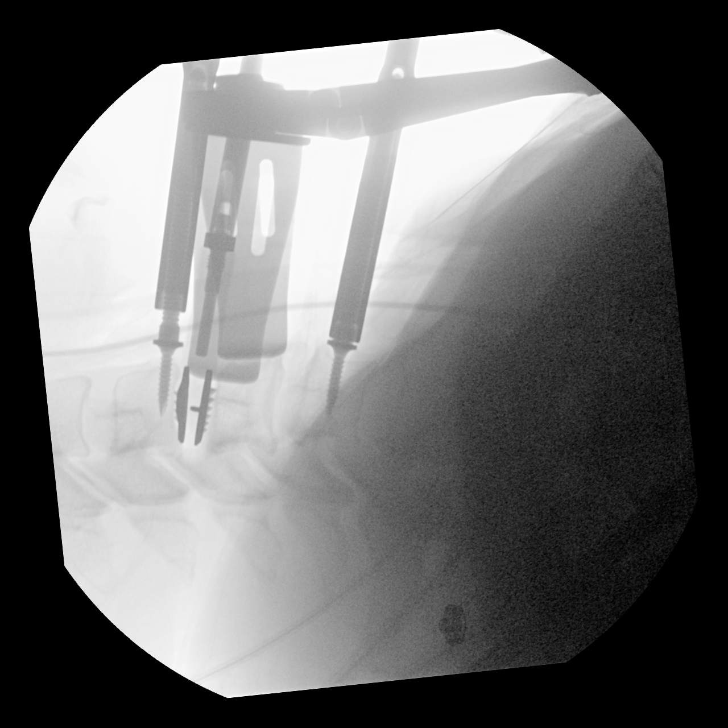
[im 4/8]
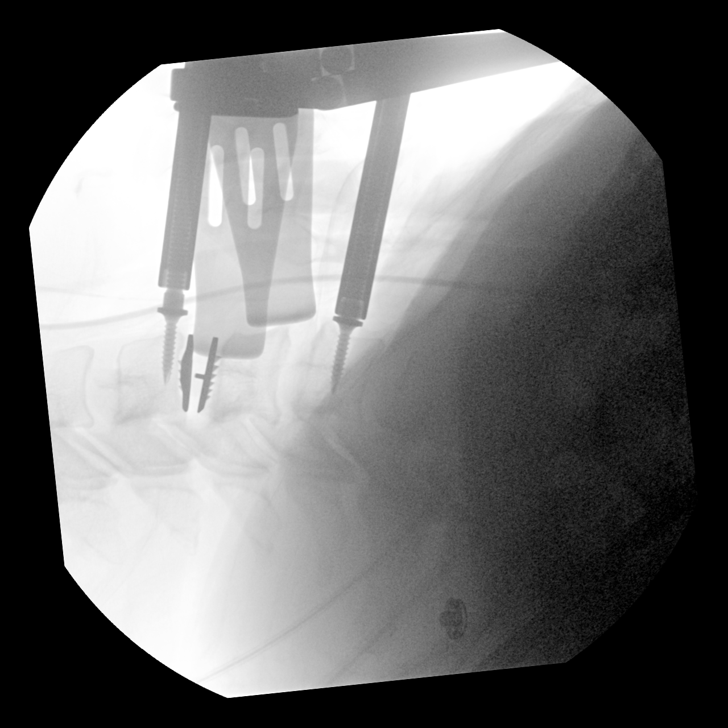
[im 5/8]
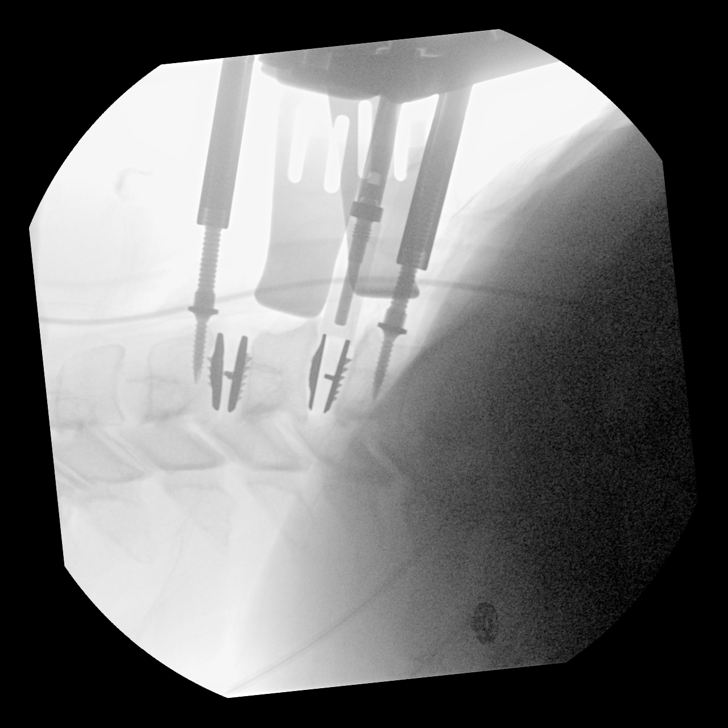
[im 6/8]
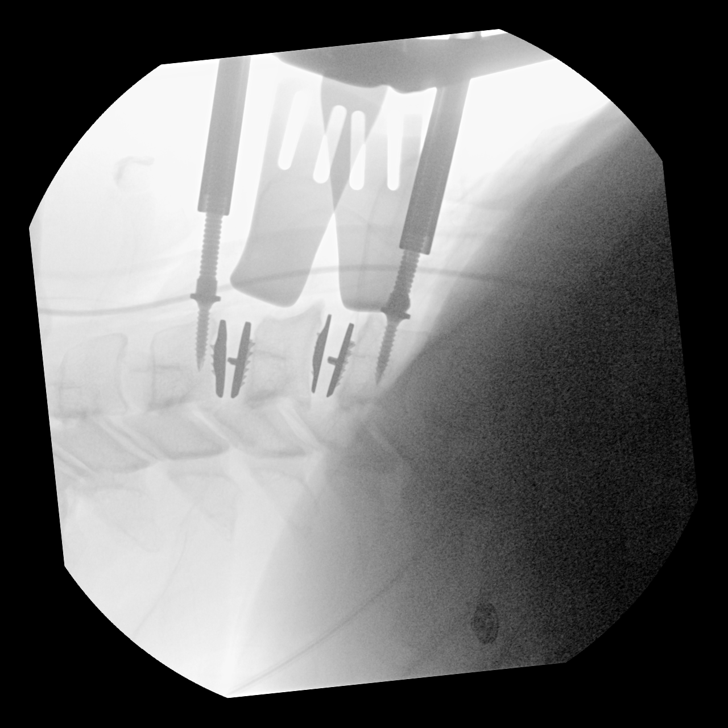
[im 7/8]
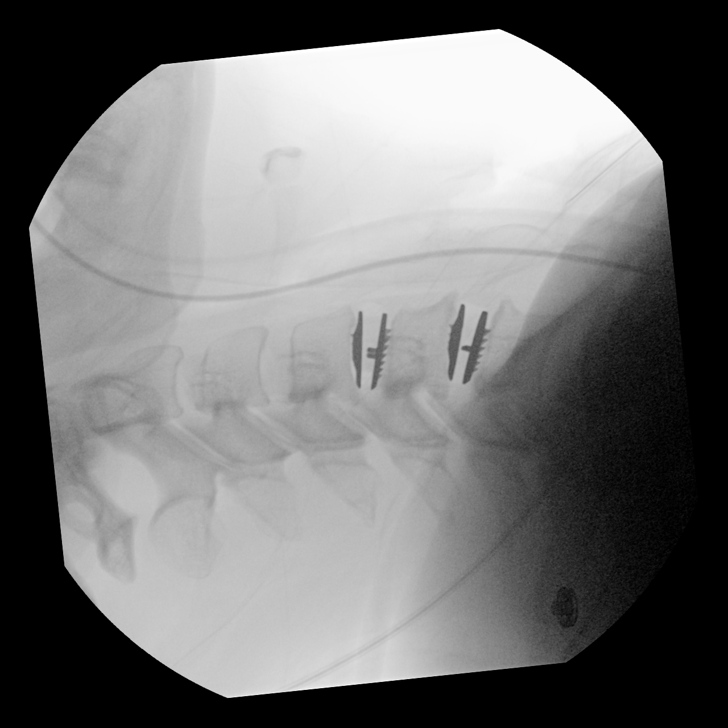
[im 8/8]
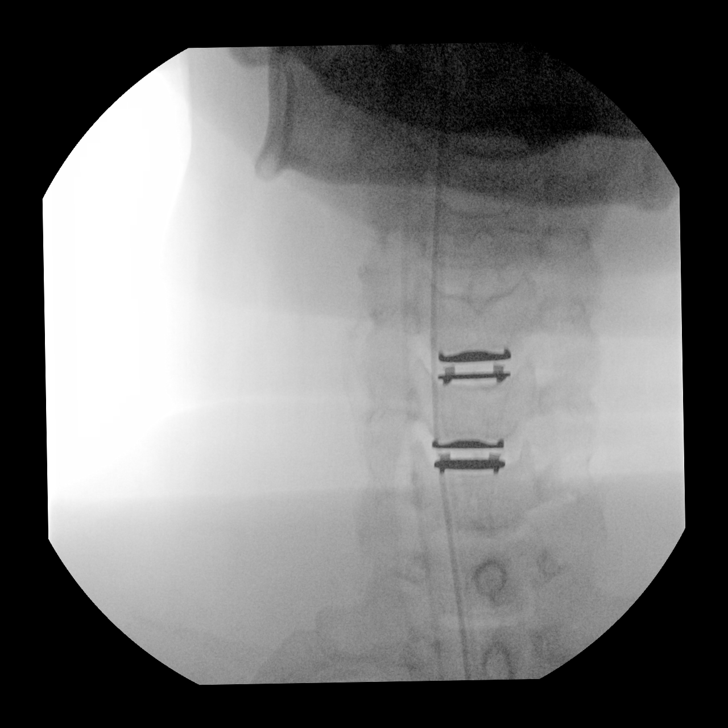

[8 of 8 positions shown; findings below may reference images not displayed]

FINDINGS: Multiple intraoperative fluoroscopic spot images are provided.
Interbody prosthesis placement at C4-5 and C5-6 in satisfactory
position.
IMPRESSION: Interbody prosthesis placement at C4-5 and C5-6.

## 2019-08-03 SURGERY — CERVICAL ANTERIOR DISC ARTHROPLASTY
Anesthesia: General | Site: Neck

## 2019-08-03 MED ORDER — PHENYLEPHRINE HCL (PRESSORS) 10 MG/ML IV SOLN
INTRAVENOUS | Status: DC | PRN
  Administered 2019-08-03 (×2): 100 ug via INTRAVENOUS

## 2019-08-03 MED ORDER — FENTANYL CITRATE (PF) 100 MCG/2ML IJ SOLN
INTRAMUSCULAR | Status: DC | PRN
  Administered 2019-08-03 (×4): 50 ug via INTRAVENOUS

## 2019-08-03 MED ORDER — DULOXETINE HCL 30 MG PO CPEP
90.0000 mg | ORAL_CAPSULE | Freq: Every day | ORAL | Status: DC
  Administered 2019-08-03 – 2019-08-04 (×2): 90 mg via ORAL
  Filled 2019-08-03 (×2): qty 3

## 2019-08-03 MED ORDER — POLYETHYLENE GLYCOL 3350 17 G PO PACK: 17.0000 g | PACK | Freq: Every day | ORAL | Status: DC | PRN

## 2019-08-03 MED ORDER — ONDANSETRON HCL 4 MG/2ML IJ SOLN
INTRAMUSCULAR | Status: AC
  Administered 2019-08-03: 14:00:00 4 mg via INTRAVENOUS
  Filled 2019-08-03: qty 2

## 2019-08-03 MED ORDER — OXYCODONE HCL 5 MG PO TABS
5.0000 mg | ORAL_TABLET | ORAL | Status: DC | PRN
  Administered 2019-08-03 – 2019-08-04 (×3): 5 mg via ORAL
  Filled 2019-08-03 (×2): qty 1

## 2019-08-03 MED ORDER — BISACODYL 5 MG PO TBEC: 5.0000 mg | DELAYED_RELEASE_TABLET | Freq: Every day | ORAL | Status: DC | PRN

## 2019-08-03 MED ORDER — BACITRACIN 50000 UNITS IM SOLR
INTRAMUSCULAR | Status: AC
  Filled 2019-08-03: qty 1

## 2019-08-03 MED ORDER — SENNA 8.6 MG PO TABS
1.0000 | ORAL_TABLET | Freq: Two times a day (BID) | ORAL | Status: DC
  Administered 2019-08-03 – 2019-08-04 (×2): 8.6 mg via ORAL
  Filled 2019-08-03 (×2): qty 1

## 2019-08-03 MED ORDER — FENTANYL CITRATE (PF) 100 MCG/2ML IJ SOLN
INTRAMUSCULAR | Status: AC
  Filled 2019-08-03: qty 2

## 2019-08-03 MED ORDER — SODIUM CHLORIDE 0.9 % IV SOLN
INTRAVENOUS | Status: DC
  Administered 2019-08-03: 20:00:00 via INTRAVENOUS

## 2019-08-03 MED ORDER — THROMBIN 5000 UNITS EX SOLR
CUTANEOUS | Status: AC
  Filled 2019-08-03: qty 5000

## 2019-08-03 MED ORDER — ONDANSETRON HCL 4 MG/2ML IJ SOLN
INTRAMUSCULAR | Status: AC
  Filled 2019-08-03: qty 2

## 2019-08-03 MED ORDER — FENTANYL CITRATE (PF) 100 MCG/2ML IJ SOLN
25.0000 ug | INTRAMUSCULAR | Status: DC | PRN
  Administered 2019-08-03 (×4): 25 ug via INTRAVENOUS

## 2019-08-03 MED ORDER — MIDAZOLAM HCL 2 MG/2ML IJ SOLN
INTRAMUSCULAR | Status: DC | PRN
  Administered 2019-08-03: 2 mg via INTRAVENOUS

## 2019-08-03 MED ORDER — MAGNESIUM CITRATE PO SOLN
1.0000 | Freq: Once | ORAL | Status: DC | PRN
  Filled 2019-08-03: qty 296

## 2019-08-03 MED ORDER — MIDAZOLAM HCL 2 MG/2ML IJ SOLN
INTRAMUSCULAR | Status: AC
  Filled 2019-08-03: qty 2

## 2019-08-03 MED ORDER — ONDANSETRON HCL 4 MG/2ML IJ SOLN
4.0000 mg | Freq: Four times a day (QID) | INTRAMUSCULAR | Status: DC | PRN
  Administered 2019-08-03: 19:00:00 4 mg via INTRAVENOUS

## 2019-08-03 MED ORDER — LACTATED RINGERS IV SOLN
INTRAVENOUS | Status: DC
  Administered 2019-08-03: 09:00:00 via INTRAVENOUS

## 2019-08-03 MED ORDER — SUGAMMADEX SODIUM 200 MG/2ML IV SOLN
INTRAVENOUS | Status: DC | PRN
  Administered 2019-08-03: 158 mg via INTRAVENOUS

## 2019-08-03 MED ORDER — CEFAZOLIN SODIUM-DEXTROSE 2-4 GM/100ML-% IV SOLN
INTRAVENOUS | Status: AC
  Filled 2019-08-03: qty 100

## 2019-08-03 MED ORDER — METHOCARBAMOL 1000 MG/10ML IJ SOLN
500.0000 mg | Freq: Four times a day (QID) | INTRAVENOUS | Status: DC
  Filled 2019-08-03: qty 5

## 2019-08-03 MED ORDER — GLYCOPYRROLATE 0.2 MG/ML IJ SOLN
INTRAMUSCULAR | Status: DC | PRN
  Administered 2019-08-03: 0.2 mg via INTRAVENOUS

## 2019-08-03 MED ORDER — PROMETHAZINE HCL 25 MG/ML IJ SOLN
INTRAMUSCULAR | Status: AC
  Filled 2019-08-03: qty 1

## 2019-08-03 MED ORDER — SODIUM CHLORIDE 0.9 % IR SOLN
Status: DC | PRN
  Administered 2019-08-03: 11:00:00 1000 mL

## 2019-08-03 MED ORDER — FENTANYL CITRATE (PF) 100 MCG/2ML IJ SOLN
INTRAMUSCULAR | Status: AC
  Administered 2019-08-03: 14:00:00 25 ug via INTRAVENOUS
  Filled 2019-08-03: qty 2

## 2019-08-03 MED ORDER — METHOCARBAMOL 500 MG PO TABS: 500.0000 mg | ORAL_TABLET | Freq: Four times a day (QID) | ORAL | 0 refills | Status: DC | PRN

## 2019-08-03 MED ORDER — ACETAMINOPHEN 500 MG PO TABS
1000.0000 mg | ORAL_TABLET | Freq: Four times a day (QID) | ORAL | Status: DC
  Administered 2019-08-03 – 2019-08-04 (×2): 1000 mg via ORAL
  Filled 2019-08-03 (×3): qty 2

## 2019-08-03 MED ORDER — HYDROMORPHONE HCL 1 MG/ML IJ SOLN
INTRAMUSCULAR | Status: AC
  Filled 2019-08-03: qty 1

## 2019-08-03 MED ORDER — ONDANSETRON HCL 4 MG/2ML IJ SOLN
INTRAMUSCULAR | Status: DC | PRN
  Administered 2019-08-03: 4 mg via INTRAVENOUS

## 2019-08-03 MED ORDER — EPHEDRINE SULFATE 50 MG/ML IJ SOLN
INTRAMUSCULAR | Status: DC | PRN
  Administered 2019-08-03: 5 mg via INTRAVENOUS

## 2019-08-03 MED ORDER — ONDANSETRON HCL 4 MG/2ML IJ SOLN
4.0000 mg | Freq: Once | INTRAMUSCULAR | Status: AC | PRN
  Administered 2019-08-03: 14:00:00 4 mg via INTRAVENOUS

## 2019-08-03 MED ORDER — GLYCOPYRROLATE 0.2 MG/ML IJ SOLN
INTRAMUSCULAR | Status: AC
  Filled 2019-08-03: qty 1

## 2019-08-03 MED ORDER — SODIUM CHLORIDE FLUSH 0.9 % IV SOLN
INTRAVENOUS | Status: AC
  Filled 2019-08-03: qty 10

## 2019-08-03 MED ORDER — THROMBIN 5000 UNITS EX SOLR
CUTANEOUS | Status: DC | PRN
  Administered 2019-08-03: 5000 [IU] via TOPICAL

## 2019-08-03 MED ORDER — DEXAMETHASONE SODIUM PHOSPHATE 10 MG/ML IJ SOLN
INTRAMUSCULAR | Status: AC
  Filled 2019-08-03: qty 1

## 2019-08-03 MED ORDER — BUPIVACAINE-EPINEPHRINE (PF) 0.5% -1:200000 IJ SOLN
INTRAMUSCULAR | Status: AC
  Filled 2019-08-03: qty 30

## 2019-08-03 MED ORDER — SUGAMMADEX SODIUM 200 MG/2ML IV SOLN
INTRAVENOUS | Status: AC
  Filled 2019-08-03: qty 2

## 2019-08-03 MED ORDER — SODIUM CHLORIDE 0.9% FLUSH: 3.0000 mL | INTRAVENOUS | Status: DC | PRN

## 2019-08-03 MED ORDER — LIDOCAINE HCL (PF) 2 % IJ SOLN
INTRAMUSCULAR | Status: AC
  Filled 2019-08-03: qty 10

## 2019-08-03 MED ORDER — OXYCODONE HCL 5 MG PO TABS: 5.0000 mg | ORAL_TABLET | ORAL | 0 refills | Status: DC | PRN

## 2019-08-03 MED ORDER — SUCCINYLCHOLINE CHLORIDE 20 MG/ML IJ SOLN
INTRAMUSCULAR | Status: DC | PRN
  Administered 2019-08-03: 100 mg via INTRAVENOUS

## 2019-08-03 MED ORDER — CEFAZOLIN SODIUM-DEXTROSE 2-4 GM/100ML-% IV SOLN
2.0000 g | INTRAVENOUS | Status: AC
  Administered 2019-08-03: 2 g via INTRAVENOUS

## 2019-08-03 MED ORDER — LIDOCAINE HCL (CARDIAC) PF 100 MG/5ML IV SOSY
PREFILLED_SYRINGE | INTRAVENOUS | Status: DC | PRN
  Administered 2019-08-03: 50 mg via INTRAVENOUS

## 2019-08-03 MED ORDER — ONDANSETRON HCL 4 MG PO TABS: 4.0000 mg | ORAL_TABLET | Freq: Four times a day (QID) | ORAL | Status: DC | PRN

## 2019-08-03 MED ORDER — OXYCODONE HCL 5 MG PO TABS
10.0000 mg | ORAL_TABLET | ORAL | Status: DC | PRN
  Filled 2019-08-03: qty 2

## 2019-08-03 MED ORDER — KETAMINE HCL 50 MG/ML IJ SOLN
INTRAMUSCULAR | Status: DC | PRN
  Administered 2019-08-03 (×2): 25 mg via INTRAVENOUS

## 2019-08-03 MED ORDER — ACETAMINOPHEN 650 MG RE SUPP: 650.0000 mg | RECTAL | Status: DC | PRN

## 2019-08-03 MED ORDER — BUPIVACAINE-EPINEPHRINE (PF) 0.5% -1:200000 IJ SOLN
INTRAMUSCULAR | Status: DC | PRN
  Administered 2019-08-03: 3 mL

## 2019-08-03 MED ORDER — SCOPOLAMINE 1 MG/3DAYS TD PT72
1.0000 | MEDICATED_PATCH | TRANSDERMAL | Status: DC
  Administered 2019-08-03: 10:00:00 1.5 mg via TRANSDERMAL

## 2019-08-03 MED ORDER — PROPOFOL 10 MG/ML IV BOLUS
INTRAVENOUS | Status: DC | PRN
  Administered 2019-08-03: 150 mg via INTRAVENOUS

## 2019-08-03 MED ORDER — SCOPOLAMINE 1 MG/3DAYS TD PT72
MEDICATED_PATCH | TRANSDERMAL | Status: AC
  Administered 2019-08-03: 10:00:00 1.5 mg via TRANSDERMAL
  Filled 2019-08-03: qty 1

## 2019-08-03 MED ORDER — METHOCARBAMOL 500 MG PO TABS
500.0000 mg | ORAL_TABLET | Freq: Four times a day (QID) | ORAL | Status: DC
  Administered 2019-08-03 – 2019-08-04 (×2): 500 mg via ORAL
  Filled 2019-08-03 (×2): qty 1

## 2019-08-03 MED ORDER — SODIUM CHLORIDE 0.9 % IV SOLN: 250.0000 mL | INTRAVENOUS | Status: DC

## 2019-08-03 MED ORDER — ACETAMINOPHEN 325 MG PO TABS: 650.0000 mg | ORAL_TABLET | ORAL | Status: DC | PRN

## 2019-08-03 MED ORDER — PROPOFOL 10 MG/ML IV BOLUS
INTRAVENOUS | Status: AC
  Filled 2019-08-03: qty 20

## 2019-08-03 MED ORDER — SODIUM CHLORIDE 0.9% FLUSH: 3.0000 mL | Freq: Two times a day (BID) | INTRAVENOUS | Status: DC

## 2019-08-03 MED ORDER — HYDROMORPHONE HCL 1 MG/ML IJ SOLN
0.5000 mg | INTRAMUSCULAR | Status: DC | PRN
  Administered 2019-08-03: 19:00:00 0.5 mg via INTRAVENOUS

## 2019-08-03 MED ORDER — PHENOL 1.4 % MT LIQD
1.0000 | OROMUCOSAL | Status: DC | PRN
  Filled 2019-08-03: qty 177

## 2019-08-03 MED ORDER — DEXAMETHASONE SODIUM PHOSPHATE 10 MG/ML IJ SOLN
INTRAMUSCULAR | Status: DC | PRN
  Administered 2019-08-03: 10 mg via INTRAVENOUS

## 2019-08-03 MED ORDER — PROMETHAZINE HCL 25 MG/ML IJ SOLN
6.2500 mg | INTRAMUSCULAR | Status: DC | PRN
  Administered 2019-08-03: 16:00:00 6.25 mg via INTRAVENOUS

## 2019-08-03 MED ORDER — ROCURONIUM BROMIDE 100 MG/10ML IV SOLN
INTRAVENOUS | Status: DC | PRN
  Administered 2019-08-03: 25 mg via INTRAVENOUS
  Administered 2019-08-03: 5 mg via INTRAVENOUS

## 2019-08-03 MED ORDER — MENTHOL 3 MG MT LOZG
1.0000 | LOZENGE | OROMUCOSAL | Status: DC | PRN
  Filled 2019-08-03: qty 9

## 2019-08-03 SURGICAL SUPPLY — 60 items
ADH SKN CLS APL DERMABOND .7 (GAUZE/BANDAGES/DRESSINGS) ×1
AGENT HMST MTR 8 SURGIFLO (HEMOSTASIS) ×1
APL PRP STRL LF DISP 70% ISPRP (MISCELLANEOUS) ×2
BUR NEURO DRILL SOFT 3.0X3.8M (BURR) ×2 IMPLANT
CANISTER SUCT 1200ML W/VALVE (MISCELLANEOUS) ×4 IMPLANT
CHLORAPREP W/TINT 26 (MISCELLANEOUS) ×4 IMPLANT
COUNTER NEEDLE 20/40 LG (NEEDLE) ×2 IMPLANT
COVER LIGHT HANDLE STERIS (MISCELLANEOUS) ×4 IMPLANT
COVER WAND RF STERILE (DRAPES) ×2 IMPLANT
CRADLE LAMINECT ARM (MISCELLANEOUS) ×2 IMPLANT
DERMABOND ADVANCED (GAUZE/BANDAGES/DRESSINGS) ×1
DERMABOND ADVANCED .7 DNX12 (GAUZE/BANDAGES/DRESSINGS) ×1 IMPLANT
DISC MOBI-C CERVICAL 15X7X15 (Neuro Prosthesis/Implant) ×2 IMPLANT
DRAPE C-ARM 42X72 X-RAY (DRAPES) ×4 IMPLANT
DRAPE INCISE IOBAN 66X45 STRL (DRAPES) ×1 IMPLANT
DRAPE LAPAROTOMY 77X122 PED (DRAPES) ×2 IMPLANT
DRAPE MICROSCOPE SPINE 48X150 (DRAPES) ×2 IMPLANT
DRAPE POUCH INSTRU U-SHP 10X18 (DRAPES) ×2 IMPLANT
DRAPE SURG 17X11 SM STRL (DRAPES) ×8 IMPLANT
ELECT CAUTERY BLADE TIP 2.5 (TIP) ×2
ELECT REM PT RETURN 9FT ADLT (ELECTROSURGICAL) ×2
ELECTRODE CAUTERY BLDE TIP 2.5 (TIP) ×1 IMPLANT
ELECTRODE REM PT RTRN 9FT ADLT (ELECTROSURGICAL) ×1 IMPLANT
FEE INTRAOP MONITOR IMPULS NCS (MISCELLANEOUS) IMPLANT
FRAME EYE SHIELD (PROTECTIVE WEAR) ×4 IMPLANT
GLOVE BIOGEL PI IND STRL 7.0 (GLOVE) ×1 IMPLANT
GLOVE BIOGEL PI IND STRL 7.5 (GLOVE) IMPLANT
GLOVE BIOGEL PI INDICATOR 7.0 (GLOVE) ×1
GLOVE BIOGEL PI INDICATOR 7.5 (GLOVE) ×5
GLOVE SURG SYN 7.0 (GLOVE) ×4 IMPLANT
GLOVE SURG SYN 7.0 PF PI (GLOVE) ×2 IMPLANT
GLOVE SURG SYN 8.5  E (GLOVE) ×3
GLOVE SURG SYN 8.5 E (GLOVE) ×3 IMPLANT
GLOVE SURG SYN 8.5 PF PI (GLOVE) ×3 IMPLANT
GOWN SRG XL LVL 3 NONREINFORCE (GOWNS) ×1 IMPLANT
GOWN STRL NON-REIN TWL XL LVL3 (GOWNS) ×2
GOWN STRL REUS W/TWL MED LVL3 (GOWN DISPOSABLE) ×2 IMPLANT
GRADUATE 1200CC STRL 31836 (MISCELLANEOUS) ×2 IMPLANT
INTRAOP MONITOR FEE IMPULS NCS (MISCELLANEOUS)
INTRAOP MONITOR FEE IMPULSE (MISCELLANEOUS)
KIT TURNOVER KIT A (KITS) ×2 IMPLANT
MARKER SKIN DUAL TIP RULER LAB (MISCELLANEOUS) ×2 IMPLANT
NDL SAFETY ECLIPSE 18X1.5 (NEEDLE) IMPLANT
NEEDLE HYPO 18GX1.5 SHARP (NEEDLE)
NEEDLE HYPO 22GX1.5 SAFETY (NEEDLE) ×2 IMPLANT
NS IRRIG 1000ML POUR BTL (IV SOLUTION) ×2 IMPLANT
PACK LAMINECTOMY NEURO (CUSTOM PROCEDURE TRAY) ×2 IMPLANT
PIN CASPAR SPINAL 14MM CERVICA (PIN) ×1 IMPLANT
SLEEVE PROTECTION STRL DISP (MISCELLANEOUS) ×1 IMPLANT
SPOGE SURGIFLO 8M (HEMOSTASIS) ×1
SPONGE KITTNER 5P (MISCELLANEOUS) ×2 IMPLANT
SPONGE SURGIFLO 8M (HEMOSTASIS) ×1 IMPLANT
STAPLER SKIN PROX 35W (STAPLE) IMPLANT
SUT V-LOC 90 ABS DVC 3-0 CL (SUTURE) ×2 IMPLANT
SUT VIC AB 3-0 SH 8-18 (SUTURE) ×2 IMPLANT
SYR 30ML LL (SYRINGE) ×2 IMPLANT
TAPE CLOTH 3X10 WHT NS LF (GAUZE/BANDAGES/DRESSINGS) ×2 IMPLANT
TOWEL OR 17X26 4PK STRL BLUE (TOWEL DISPOSABLE) ×6 IMPLANT
TRAY FOLEY MTR SLVR 16FR STAT (SET/KITS/TRAYS/PACK) IMPLANT
TUBING CONNECTING 10 (TUBING) ×2 IMPLANT

## 2019-08-03 NOTE — Anesthesia Post-op Follow-up Note (Signed)
Anesthesia QCDR form completed.        

## 2019-08-03 NOTE — Discharge Summary (Signed)
Procedure: C4-6 Arthroplasty Procedure date: 08/03/2019 Diagnosis: cervical myelopathy  History: Christine Simmons is s/p C4-6 cervical arthroplasty for cervical myelopathy  POD1: She is doing better. States hands and leg complaints that she was experiencing prior to surgery have improved. Tolerating liquids without issue. Difficulty with breakfast this morning. Ambulating to bathroom and voiding without issue. Complains of posterior neck pain and itching. Nausea has improved.    Physical Exam: Vitals:   08/03/19 0854  BP: 116/78  Pulse: 83  Temp: 97.9 F (36.6 C)  SpO2: 99%    AA Ox3 Strength:5/5 throughout upper extremities Skin: glue intact at incision site.   Data:  Recent Labs  Lab 07/29/19 1200  NA 137  K 3.9  CL 105  CO2 23  BUN 11  CREATININE 0.65  GLUCOSE 120*  CALCIUM 9.1   No results for input(s): AST, ALT, ALKPHOS in the last 168 hours.  Invalid input(s): TBILI   Recent Labs  Lab 07/29/19 1200  WBC 8.3  HGB 13.5  HCT 40.7  PLT 262   Recent Labs  Lab 07/29/19 1200  APTT 28  INR 1.0         Other tests/results:  EXAM: CERVICAL SPINE - 2-3 VIEW 08/03/2019   COMPARISON:  Intraoperative films from earlier in the same day.  FINDINGS: Changes consistent with disc arthroplasty at C4-5 and C5-6. No acute bony or soft tissue abnormality is noted.  IMPRESSION: Status post disc arthroplasty at C4-5 and C5-6. Assessment/Plan:  Christine Simmons is status post C4-C6 cervical arthroplasty for cervical myelopathy. Symptoms that were present prior to surgery have improved. Discussed soft diet until more solid food is better tolerated.   We will continue postop pain control with Tylenol, naproxen, muscle relaxer, and pain medication as needed.  She is scheduled to follow-up in clinic in approximately 2 weeks to monitor progress.  Marin Olp PA-C Department of Neurosurgery

## 2019-08-03 NOTE — Progress Notes (Signed)
Amanda PA at bedside, pt going to be admitted.

## 2019-08-03 NOTE — Transfer of Care (Signed)
Immediate Anesthesia Transfer of Care Note  Patient: Christine Simmons  Procedure(s) Performed: CERVICAL ANTERIOR DISC ARTHROPLASTY C4-6 (N/A Neck)  Patient Location: PACU  Anesthesia Type:General  Level of Consciousness: sedated  Airway & Oxygen Therapy: Patient Spontanous Breathing and Patient connected to face mask oxygen  Post-op Assessment: Report given to RN and Post -op Vital signs reviewed and stable  Post vital signs: Reviewed  Last Vitals:  Vitals Value Taken Time  BP 123/74 08/03/19 1248  Temp    Pulse 97 08/03/19 1249  Resp 11 08/03/19 1249  SpO2 99 % 08/03/19 1249  Vitals shown include unvalidated device data.  Last Pain:  Vitals:   08/03/19 0854  TempSrc: Oral  PainSc: 5          Complications: No apparent anesthesia complications

## 2019-08-03 NOTE — H&P (Signed)
I have reviewed and confirmed my history and physical from 07/14/2019 with no additions or changes. Plan for C4-6 arthroplasty.  Risks and benefits reviewed.  Heart sounds normal no MRG. Chest Clear to Auscultation Bilaterally.

## 2019-08-03 NOTE — Consult Note (Signed)
Pharmacy Antibiotic Note  Christine Simmons is a 43 y.o. female admitted on 08/03/2019 for planned surgical procedure.  Pharmacy has been consulted for Cefazolin pre-op dosing.  Plan: Cefazolin 2g IV 30 minutes Pre-Op x1 ordered.   Height: 5\' 3"  (160 cm) Weight: 174 lb 2.6 oz (79 kg) IBW/kg (Calculated) : 52.4  Temp (24hrs), Avg:97.9 F (36.6 C), Min:97.9 F (36.6 C), Max:97.9 F (36.6 C)  Recent Labs  Lab 07/29/19 1200  WBC 8.3  CREATININE 0.65    Estimated Creatinine Clearance: 92 mL/min (by C-G formula based on SCr of 0.65 mg/dL).    Allergies  Allergen Reactions  . Sulfa Antibiotics Nausea And Vomiting, Nausea Only and Rash    Other reaction(s): Vomiting Hallucinations   Thank you for allowing pharmacy to be a part of this patient's care.  Pernell Dupre, PharmD, BCPS Clinical Pharmacist 08/03/2019 9:27 AM

## 2019-08-03 NOTE — Anesthesia Postprocedure Evaluation (Signed)
Anesthesia Post Note  Patient: Christine Simmons  Procedure(s) Performed: CERVICAL ANTERIOR DISC ARTHROPLASTY C4-6 (N/A Neck)  Patient location during evaluation: PACU Anesthesia Type: General Level of consciousness: awake and alert Pain management: pain level controlled Vital Signs Assessment: post-procedure vital signs reviewed and stable Respiratory status: spontaneous breathing and respiratory function stable Cardiovascular status: stable Anesthetic complications: no     Last Vitals:  Vitals:   08/03/19 1248 08/03/19 1303  BP: 123/74 125/73  Pulse: 96 (!) 116  Resp: 13 18  Temp: (!) 36.2 C   SpO2: 98% 93%    Last Pain:  Vitals:   08/03/19 1248  TempSrc:   PainSc: Asleep                 KEPHART,WILLIAM K

## 2019-08-03 NOTE — Progress Notes (Signed)
Pt able to grip strongly, no numbness or tingling at this time, strong with leg pushes/Pulls , able to ambulate and void,still with nausea, had phenergan in pacu,, will update [place] PA

## 2019-08-03 NOTE — Anesthesia Procedure Notes (Signed)
Procedure Name: Intubation Performed by: Logan, Benjamin, CRNA Pre-anesthesia Checklist: Patient identified, Patient being monitored, Timeout performed, Emergency Drugs available and Suction available Patient Re-evaluated:Patient Re-evaluated prior to induction Oxygen Delivery Method: Circle system utilized Preoxygenation: Pre-oxygenation with 100% oxygen Induction Type: IV induction Ventilation: Mask ventilation without difficulty Laryngoscope Size: 3 and McGraph Grade View: Grade II Tube type: Oral Tube size: 7.0 mm Number of attempts: 1 Airway Equipment and Method: Stylet,  LTA kit utilized and Video-laryngoscopy Placement Confirmation: ETT inserted through vocal cords under direct vision,  positive ETCO2 and breath sounds checked- equal and bilateral Secured at: 21 cm Tube secured with: Tape Dental Injury: Teeth and Oropharynx as per pre-operative assessment  Difficulty Due To: Difficulty was anticipated, Difficult Airway- due to limited oral opening and Difficult Airway- due to dentition Future Recommendations: Recommend- induction with short-acting agent, and alternative techniques readily available       

## 2019-08-03 NOTE — Progress Notes (Signed)
Procedure: C4-C6 Arthroplasty Procedure date: 08/03/2019 Diagnosis: Cervical myelopathy  History: Christine Simmons is s/p C4-6 cervical arthroplasty for cervical myelopathy  POD0: Tolerated procedure well. During post op recovery she experienced significant nausea despite pre operative scopolamine patch. Provided phenergan and this helped with the nausea but she was very sleepy and lethargic. She was able to ambulate to bathroom with assistance and void. She has not been able to tolerate food because of the nausea. No issues with swallowing noted.   Patient complaint at this time is nausea, sleepiness, and posterior neck pain. No new upper extremity pain/numbness/tingling/weakness.   Physical Exam: Vitals:   08/03/19 1609 08/03/19 1635  BP:  (!) 141/90  Pulse: 96 97  Resp: 11 15  Temp: 98 F (36.7 C) (!) 97.4 F (36.3 C)  SpO2: 96% 93%   Strength:Unable to fully and accurately assess at this time - patient is very sleepy.  Sensation: Unable to accurately assess at this time Skin: Glue intact at incision site.   Data:  Recent Labs  Lab 07/29/19 1200  NA 137  K 3.9  CL 105  CO2 23  BUN 11  CREATININE 0.65  GLUCOSE 120*  CALCIUM 9.1   No results for input(s): AST, ALT, ALKPHOS in the last 168 hours.  Invalid input(s): TBILI   Recent Labs  Lab 07/29/19 1200  WBC 8.3  HGB 13.5  HCT 40.7  PLT 262   Recent Labs  Lab 07/29/19 1200  APTT 28  INR 1.0         Other tests/results: Cervical xrays pending  Assessment/Plan:  Christine Simmons is POD0 s/p C4-6 arthroplasty for cervical myelopathy.   - mobilize - pain control - DVT prophylaxis  Marin Olp PA-C Department of Neurosurgery

## 2019-08-03 NOTE — Anesthesia Preprocedure Evaluation (Signed)
Anesthesia Evaluation  Patient identified by MRN, date of birth, ID band Patient awake    Reviewed: Allergy & Precautions, NPO status , Patient's Chart, lab work & pertinent test results  History of Anesthesia Complications (+) PONVNegative for: history of anesthetic complications  Airway Mallampati: III       Dental  (+) Missing, Chipped   Pulmonary neg sleep apnea, neg COPD, Not current smoker, former smoker,           Cardiovascular (-) hypertension(-) Past MI and (-) CHF (-) dysrhythmias (-) Valvular Problems/Murmurs     Neuro/Psych neg Seizures Anxiety    GI/Hepatic Neg liver ROS, PUD, GERD  Medicated and Controlled,  Endo/Other  neg diabetes  Renal/GU Renal InsufficiencyRenal disease     Musculoskeletal   Abdominal   Peds  Hematology   Anesthesia Other Findings   Reproductive/Obstetrics                             Anesthesia Physical Anesthesia Plan  ASA: II  Anesthesia Plan: General   Post-op Pain Management:    Induction: Intravenous  PONV Risk Score and Plan: 3 and Scopolamine patch - Pre-op, Dexamethasone and Ondansetron  Airway Management Planned: Oral ETT  Additional Equipment:   Intra-op Plan:   Post-operative Plan:   Informed Consent: I have reviewed the patients History and Physical, chart, labs and discussed the procedure including the risks, benefits and alternatives for the proposed anesthesia with the patient or authorized representative who has indicated his/her understanding and acceptance.       Plan Discussed with:   Anesthesia Plan Comments:         Anesthesia Quick Evaluation

## 2019-08-03 NOTE — Progress Notes (Signed)
Per Randolm Idol, pt may get DG cerival xray at discharge en route to her car. Lia Hopping, post op RN, understands to take patient to xray before discharging home.

## 2019-08-03 NOTE — Discharge Instructions (Addendum)
°Your surgeon has performed an operation on your cervical spine (neck) to relieve pressure on the spinal cord and/or nerves. This involved making an incision in the front of your neck and removing one or more of the discs that support your spine. Next, a small piece of bone, a titanium plate, and screws were used to fuse two or more of the vertebrae (bones) together. ° °The following are instructions to help in your recovery once you have been discharged from the hospital. Even if you feel well, it is important that you follow these activity guidelines. If you do not let your neck heal properly from the surgery, you can increase the chance of return of your symptoms and other complications. ° °Activity  °  °No bending, lifting, or twisting (“BLT”). Avoid lifting objects heavier than 10 pounds (gallon milk jug).  Where possible, avoid household activities that involve lifting, bending, reaching, pushing, or pulling such as laundry, vacuuming, grocery shopping, and childcare. Try to arrange for help from friends and family for these activities while your back heals. ° °Increase physical activity slowly as tolerated.  Taking short walks is encouraged, but avoid strenuous exercise. Do not jog, run, bicycle, lift weights, or participate in any other exercises unless specifically allowed by your doctor. ° °Talk to your doctor before resuming sexual activity. ° °You should not drive until cleared by your doctor. ° °Until released by your doctor, you should not return to work or school.  You should rest at home and let your body heal.  ° °You may shower three days after your surgery.  After showering, lightly dab your incision dry. Do not take a tub bath or go swimming until approved by your doctor at your follow-up appointment. ° °If your doctor ordered a cervical collar (neck brace) for you, you should wear it whenever you are out of bed. You may remove it when lying down or sleeping, but you should wear it at all other  times. Not all neck surgeries require a cervical collar. ° °If you smoke, we strongly recommend that you quit.  Smoking has been proven to interfere with normal bone healing and will dramatically reduce the success rate of your surgery. Please contact [facility] (800-QUIT-NOW) and use the resources at www.[facility].com for assistance in stopping smoking. ° °Surgical Incision °  °If you have a dressing on your incision, you may remove it two days after your surgery. Keep your incision area clean and dry. ° °If you have staples or stitches on your incision, you should have a follow up scheduled for removal. If you do not have staples or stitches, you will have steri-strips (small pieces of surgical tape) or Dermabond glue. The steri-strips/glue should begin to peel away within about a week (it is fine if the steri-strips fall off before then). If the strips are still in place one week after your surgery, you may gently remove them. ° °Diet          ° °You may return to your usual diet. However, you may experience discomfort when swallowing in the first month after your surgery. This is normal. You may find that softer foods are more comfortable for you to swallow. Be sure to stay hydrated. ° °When to Contact Us ° °You may experience pain in your neck and/or pain between your shoulder blades. This is normal and should improve in the next few weeks with the help of pain medication, muscle relaxers, and rest. Some patients report that a warm compress   compress on the back of the neck or between the shoulder blades helps.  However, should you experience any of the following, contact us immediately:  New numbness or weakness  Pain that is progressively getting worse, and is not relieved by your pain medication, muscle relaxers, rest, and warm compresses  Bleeding, redness, swelling, pain, or drainage from surgical incision  Chills or flu-like symptoms  Fever greater than 101.0 F (38.3 C)  Inability to eat, drink  fluids, or take medications  Problems with bowel or bladder functions  Difficulty breathing or shortness of breath  Warmth, tenderness, or swelling in your calf Contact Information  During office hours (Monday-Friday 9 am to 5 pm), please call your physician at [phone] and ask for Berdine Addison  After hours and weekends, please call the [place] Operator at [phone] and ask for the Neurosurgery Resident On Call   For a life-threatening emergency, call [place]   1) The drugs that you were given will stay in your system until tomorrow so for the next 24 hours you should not:  A) Drive an automobile B) Make any legal decisions C) Drink any alcoholic beverage   2) You may resume regular meals tomorrow.  Today it is better to start with liquids and gradually work up to solid foods.  You may eat anything you prefer, but it is better to start with liquids, then soup and crackers, and gradually work up to solid foods.   3) Please notify your doctor immediately if you have any unusual bleeding, trouble breathing, redness and pain at the surgery site, drainage, fever, or pain not relieved by medication.    4) Additional Instructions:        Please contact your physician with any problems or Same Day Surgery at [phone], Monday through Friday 6 am to 4 pm, or [place] at [facility] number at [phone].

## 2019-08-03 NOTE — Progress Notes (Signed)
Pts xray done

## 2019-08-03 NOTE — Progress Notes (Signed)
Christine Simmons in to see patient, patient very sleepy at this time,will monitor for now

## 2019-08-03 NOTE — Op Note (Signed)
Indications: Christine Simmons is a 43 yo female who presented with cervical myelopathy.  She failed conservative including physical therapy and injections.  She had worsening symptoms so presented for surgical intervention.  Findings: cervical stenosis  Preoperative Diagnosis: Cervical myelopathy Postoperative Diagnosis: same   EBL: 25 ml IVF: 600 ml Drains: none Disposition: Extubated and Stable to PACU Complications: none  No foley catheter was placed.   Preoperative Note:   Risks of surgery discussed include: infection, bleeding, stroke, coma, death, paralysis, CSF leak, nerve/spinal cord injury, numbness, tingling, weakness, complex regional pain syndrome, recurrent stenosis and/or disc herniation, vascular injury, development of instability, neck/back pain, need for further surgery, persistent symptoms, development of deformity, and the risks of anesthesia. The patient understood these risks and agreed to proceed.  Operative Note:   Operative Note:  Procedure:  1) Cervical Disc Arthroplasty at C4/5 and C5/6 using a LDR Mobi-C device   Procedure: After obtaining informed consent, the patient taken to the operating room, placed in supine position, general anesthesia induced.  The patient had a small shoulder roll placed behind the neck.  The patient received preop antibiotics and IV Decadron.  The patient had a neck incision outlined, was prepped and draped in usual sterile fashion. The incision was injected with local anesthetic.   An incision was opened, dissection taken down medial to the carotid artery and jugular vein, lateral to the trachea and esophagus.  The prevertebral fascia identified and a localizing x-ray demonstrated the correct level.  The longus colli were dissected laterally, and self-retaining retractors placed to open the operative field. The microscope was then brought into the field.  With this complete, distractor pins were placed in the vertebral bodies of C4 and  C6. The distractor was placed, then the anulus at C4/5 was opened using a bovie.  Curettes and pituitary rongeurs used to remove the majority of disk, then the drill was used to remove the posterior osteophyte and begin the foraminotomies. The nerve hook was used to elevate the posterior longitudinal ligament, which was then removed with Kerrison rongeurs. The microblunt nerve hook could be passed out the foramen bilateral at each level.   Meticulous hemostasis was obtained.  A trial spacer was used to size the disc space. Using flouroscopic guidance, a 15 mm width x 15 mm depth x 5 mm height Mobi-C was then inserted in the prepared disc space.  Then, the anulus at C5/6 was opened using a bovie.  Curettes and pituitary rongeurs used to remove the majority of disk, then the drill was used to remove the posterior osteophyte and begin the foraminotomies. The nerve hook was used to elevate the posterior longitudinal ligament, which was then removed with Kerrison rongeurs. The microblunt nerve hook could be passed out the foramen bilateral at each level.   Meticulous hemostasis was obtained.  A trial spacer was used to size the disc space. Using flouroscopic guidance, a 15 mm width x 15 mm depth x 5 mm height Mobi-C was then inserted in the prepared disc space.  The caspar distractor was removed, and bone wax used for hemostasis. Final AP and lateral radiographs were taken.   With the disc arthroplasties in good position, the wound was irrigated copiously with bacitracin-containing solution and meticulous hemostasis obtained.  Wound was closed in 2 layers using interrupted inverted 3-0 Vicryl sutures.  The wound was dressed with dermabond, the head of bed at 30 degrees, taken to recovery room in stable condition.  No new postop neurological deficits  were identified.  Sponge and pattie counts were correct at the end of the procedure.   I performed the entire procedure with the assistance of Marin Olp PA as an  Pensions consultant.  Meade Maw MD

## 2019-08-04 ENCOUNTER — Encounter: Payer: Self-pay | Admitting: Neurosurgery

## 2019-08-04 DIAGNOSIS — M4802 Spinal stenosis, cervical region: Secondary | ICD-10-CM | POA: Diagnosis not present

## 2019-08-04 DIAGNOSIS — Z79899 Other long term (current) drug therapy: Secondary | ICD-10-CM | POA: Diagnosis not present

## 2019-08-04 DIAGNOSIS — Z882 Allergy status to sulfonamides status: Secondary | ICD-10-CM | POA: Diagnosis not present

## 2019-08-04 DIAGNOSIS — Z87891 Personal history of nicotine dependence: Secondary | ICD-10-CM | POA: Diagnosis not present

## 2019-08-04 DIAGNOSIS — K219 Gastro-esophageal reflux disease without esophagitis: Secondary | ICD-10-CM | POA: Diagnosis not present

## 2019-08-04 DIAGNOSIS — Z793 Long term (current) use of hormonal contraceptives: Secondary | ICD-10-CM | POA: Diagnosis not present

## 2019-08-04 DIAGNOSIS — G959 Disease of spinal cord, unspecified: Secondary | ICD-10-CM | POA: Diagnosis not present

## 2019-08-04 DIAGNOSIS — F419 Anxiety disorder, unspecified: Secondary | ICD-10-CM | POA: Diagnosis not present

## 2019-08-04 DIAGNOSIS — M4712 Other spondylosis with myelopathy, cervical region: Secondary | ICD-10-CM | POA: Diagnosis not present

## 2019-08-04 MED ORDER — HYDROCODONE-ACETAMINOPHEN 5-325 MG PO TABS: 1.0000 | ORAL_TABLET | ORAL | 0 refills | Status: DC | PRN

## 2019-08-04 MED ORDER — HYDROCODONE-ACETAMINOPHEN 5-325 MG PO TABS: 1.0000 | ORAL_TABLET | ORAL | Status: DC | PRN

## 2019-08-04 MED ORDER — NAPROXEN 500 MG PO TABS: 500.0000 mg | ORAL_TABLET | Freq: Two times a day (BID) | ORAL | 0 refills | Status: DC

## 2019-08-04 MED ORDER — NAPROXEN 500 MG PO TABS
500.0000 mg | ORAL_TABLET | Freq: Two times a day (BID) | ORAL | Status: DC
  Administered 2019-08-04: 500 mg via ORAL
  Filled 2019-08-04 (×3): qty 1

## 2019-08-04 MED ORDER — DIPHENHYDRAMINE HCL 50 MG/ML IJ SOLN: 25.0000 mg | Freq: Four times a day (QID) | INTRAMUSCULAR | Status: DC | PRN

## 2019-08-04 MED ORDER — DIPHENHYDRAMINE HCL 25 MG PO CAPS
25.0000 mg | ORAL_CAPSULE | Freq: Four times a day (QID) | ORAL | Status: DC | PRN
  Administered 2019-08-04: 08:00:00 25 mg via ORAL
  Filled 2019-08-04: qty 1

## 2019-08-04 NOTE — Progress Notes (Signed)
Pt discharged home with husband via personal vehicle . Explained medications and what time she can take next dose. Verbalized understanding , transferred via w/c without incident.

## 2019-08-04 NOTE — Progress Notes (Signed)
Pt c/o severe itching as adverse reaction in response to oxycodone, notified Attending ,. He is ok with change and will enter alternate order .

## 2019-08-04 NOTE — Plan of Care (Signed)
  Problem: Activity: Goal: Ability to avoid complications of mobility impairment will improve Outcome: Progressing Goal: Ability to tolerate increased activity will improve Outcome: Progressing   Problem: Education: Goal: Verbalization of understanding the information provided will improve Outcome: Progressing   Problem: Education: Goal: Verbalization of understanding the information provided will improve Outcome: Progressing   Problem: Coping: Goal: Level of anxiety will decrease Outcome: Progressing   Problem: Physical Regulation: Goal: Postoperative complications will be avoided or minimized Outcome: Progressing   Problem: Respiratory: Goal: Ability to maintain a clear airway will improve Outcome: Progressing   Problem: Pain Management: Goal: Pain level will decrease Outcome: Progressing   Problem: Skin Integrity: Goal: Signs of wound healing will improve Outcome: Progressing   Problem: Tissue Perfusion: Goal: Ability to maintain adequate tissue perfusion will improve Outcome: Progressing

## 2019-08-04 NOTE — TOC Transition Note (Addendum)
Transition of Care [facility]) - CM/SW Discharge Note   Patient Details  Name: Christine Simmons MRN: [id number] Date of Birth: 10-13-1976  [facility] CM/SW Contact:  Ayanna Gheen, Lenice Llamas Phone Number: [phone]  08/04/2019, 11:46 AM   Clinical Narrative: Clinical Social Worker (CSW) met with patient alone at bedside to discuss D/C plan. Patient was alert and oriented X4 and was sitting up in the bed. CSW introduced self and explained role of CSW department. Per patient she lives in [place] with her husband [name]. Patient reported that her husband is going to stay home with her and provide 24/7 care until Saturday. Per patient her adult children will be coming to stay with her on Saturday and the following week. Patient reported that she does not need a cane or walker. Patient reported that she does not need PT. Patient reported that she was been walking independently in the room today. Patient reported no other needs or concerns. Please reconsult if future social work needs arise. CSW signing off.                    Final next level of care: Home/Self Care Barriers to Discharge: Barriers Resolved   Patient Goals and CMS Choice Patient states their goals for this hospitalization and ongoing recovery are:: To go home.      Discharge Placement                       Discharge Plan and Services In-house Referral: Clinical Social Work              DME Arranged: N/A         HH Arranged: NA          Social Determinants of Health (SDOH) Interventions     Readmission Risk Interventions No flowsheet data found.

## 2019-08-04 NOTE — Progress Notes (Signed)
PRN benadryl effective, no further c/o itching at this time.

## 2019-08-08 ENCOUNTER — Other Ambulatory Visit: Payer: Self-pay | Admitting: Family Medicine

## 2019-08-14 ENCOUNTER — Encounter: Payer: Self-pay | Admitting: Podiatry

## 2019-08-22 ENCOUNTER — Telehealth: Payer: BC Managed Care – PPO | Admitting: Family

## 2019-08-22 DIAGNOSIS — Z20828 Contact with and (suspected) exposure to other viral communicable diseases: Secondary | ICD-10-CM

## 2019-08-22 DIAGNOSIS — Z20822 Contact with and (suspected) exposure to covid-19: Secondary | ICD-10-CM

## 2019-08-22 MED ORDER — BENZONATATE 100 MG PO CAPS: 100.0000 mg | ORAL_CAPSULE | Freq: Three times a day (TID) | ORAL | 0 refills | Status: DC | PRN

## 2019-08-22 NOTE — Progress Notes (Signed)
E-Visit for Corona Virus Screening   Your current symptoms could be consistent with the coronavirus.  Many health care providers can now test patients at their office but not all are.  [facility] has multiple testing sites. For information on our COVID testing locations and hours go to [email or web address]  Please quarantine yourself while awaiting your test results.  We are enrolling you in our MyChart Home Mont[facility] for COVID19 . Daily you will receive a questionnaire within the MyChart website. Our COVID 19 response team willl be monitoriing your responses daily.  You can go to one of the testing sites listed below, while they are opened (see hours). You do not need an order and will stay in your car during the test. You do need to self isolate until your results return and if positive 10 days from when your symptoms started and until you are 3 days fever free.   Testing Locations (Monday - Friday, 8 a.m. - 3:30 p.m.) . [place]: [facility], [address], [place], [place]  . [place]: [facility], [address], [place], [place] (entrance off [address])  . [place]: (Closed each Monday): Testing site relocated to the short stay covered drive at [facility]. (Use the [address] entrance to [facility] next to [facility].)   COVID-19 is a respiratory illness with symptoms that are similar to the flu. Symptoms are typically mild to moderate, but there have been cases of severe illness and death due to the virus. The following symptoms may appear 2-14 days after exposure: . Fever . Cough . Shortness of breath or difficulty breathing . Chills . Repeated shaking with chills . Muscle pain . Headache . Sore throat . New loss of taste or smell . Fatigue . Congestion or runny nose . Nausea or vomiting . Diarrhea  It is vitally important that if you feel that you  have an infection such as this virus or any other virus that you stay home and away from places where you may spread it to others.  You should self-quarantine for 14 days if you have symptoms that could potentially be coronavirus or have been in close contact a with a person diagnosed with COVID-19 within the last 2 weeks. You should avoid contact with people age 65 and older.   You should wear a mask or cloth face covering over your nose and mouth if you must be around other people or animals, including pets (even at home). Try to stay at least 6 feet away from other people. This will protect the people around you.  You can use medication such as A prescription cough medication called Tessalon Perles 100 mg. You may take 1-2 capsules every 8 hours as needed for cough.  You may also take acetaminophen (Tylenol) as needed for fever.   Reduce your risk of any infection by using the same precautions used for avoiding the common cold or flu:  . Wash your hands often with soap and warm water for at least 20 seconds.  If soap and water are not readily available, use an alcohol-based hand sanitizer with at least 60% alcohol.  . If coughing or sneezing, cover your mouth and nose by coughing or sneezing into the elbow areas of your shirt or coat, into a tissue or into your sleeve (not your hands). . Avoid shaking hands with others and consider head nods or verbal greetings only. . Avoid touching your   eyes, nose, or mouth with unwashed hands.  . Avoid close contact with people who are sick. . Avoid places or events with large numbers of people in one location, like concerts or sporting events. . Carefully consider travel plans you have or are making. . If you are planning any travel outside or inside the [place], visit the CDC's Travelers' Health webpage for the latest health notices. . If you have some symptoms but not all symptoms, continue to monitor at home and seek medical attention if your symptoms  worsen. . If you are having a medical emergency, call 911.  HOME CARE . Only take medications as instructed by your medical team. . Drink plenty of fluids and get plenty of rest. . A steam or ultrasonic humidifier can help if you have congestion.   GET HELP RIGHT AWAY IF YOU HAVE EMERGENCY WARNING SIGNS** FOR COVID-19. If you or someone is showing any of these signs seek emergency medical care immediately. Call 911 or proceed to your closest emergency facility if: . You develop worsening high fever. . Trouble breathing . Bluish lips or face . Persistent pain or pressure in the chest . New confusion . Inability to wake or stay awake . You cough up blood. . Your symptoms become more severe  **This list is not all possible symptoms. Contact your medical provider for any symptoms that are sever or concerning to you.   MAKE SURE YOU   Understand these instructions.  Will watch your condition.  Will get help right away if you are not doing well or get worse.  Your e-visit answers were reviewed by a board certified advanced clinical practitioner to complete your personal care plan.  Depending on the condition, your plan could have included both over the counter or prescription medications.  If there is a problem please reply once you have received a response from your provider.  Your safety is important to us.  If you have drug allergies check your prescription carefully.    You can use MyChart to ask questions about today's visit, request a non-urgent call back, or ask for a work or school excuse for 24 hours related to this e-Visit. If it has been greater than 24 hours you will need to follow up with your provider, or enter a new e-Visit to address those concerns. You will get an e-mail in the next two days asking about your experience.  I hope that your e-visit has been valuable and will speed your recovery. Thank you for using e-visits.  Approximately 5 minutes was spent documenting  and reviewing patient's chart.    

## 2019-08-23 ENCOUNTER — Encounter (INDEPENDENT_AMBULATORY_CARE_PROVIDER_SITE_OTHER): Payer: Self-pay

## 2019-08-24 ENCOUNTER — Other Ambulatory Visit: Payer: Self-pay | Admitting: *Deleted

## 2019-08-24 ENCOUNTER — Encounter (INDEPENDENT_AMBULATORY_CARE_PROVIDER_SITE_OTHER): Payer: Self-pay

## 2019-08-24 DIAGNOSIS — Z20822 Contact with and (suspected) exposure to covid-19: Secondary | ICD-10-CM

## 2019-08-25 ENCOUNTER — Encounter (INDEPENDENT_AMBULATORY_CARE_PROVIDER_SITE_OTHER): Payer: Self-pay

## 2019-08-26 ENCOUNTER — Ambulatory Visit: Payer: BC Managed Care – PPO | Admitting: Podiatry

## 2019-08-26 LAB — NOVEL CORONAVIRUS, NAA: SARS-CoV-2, NAA: NOT DETECTED

## 2019-09-01 ENCOUNTER — Encounter: Payer: Self-pay | Admitting: Family Medicine

## 2019-09-02 ENCOUNTER — Ambulatory Visit: Payer: BC Managed Care – PPO | Admitting: Podiatry

## 2019-09-05 ENCOUNTER — Encounter: Payer: Self-pay | Admitting: Family Medicine

## 2019-09-05 ENCOUNTER — Ambulatory Visit (INDEPENDENT_AMBULATORY_CARE_PROVIDER_SITE_OTHER): Payer: BC Managed Care – PPO | Admitting: Family Medicine

## 2019-09-05 VITALS — Ht 63.0 in | Wt 170.0 lb

## 2019-09-05 DIAGNOSIS — G8929 Other chronic pain: Secondary | ICD-10-CM | POA: Diagnosis not present

## 2019-09-05 DIAGNOSIS — M5412 Radiculopathy, cervical region: Secondary | ICD-10-CM | POA: Diagnosis not present

## 2019-09-05 DIAGNOSIS — M546 Pain in thoracic spine: Secondary | ICD-10-CM

## 2019-09-05 DIAGNOSIS — M545 Low back pain: Secondary | ICD-10-CM

## 2019-09-05 MED ORDER — DULOXETINE HCL 60 MG PO CPEP: 60.0000 mg | ORAL_CAPSULE | Freq: Every day | ORAL | 1 refills | Status: DC

## 2019-09-05 NOTE — Progress Notes (Signed)
Patient ID: Christine Simmons, female   DOB: Jul 15, 1976, 43 y.o.   MRN: [id number]    Virtual Visit via video Note  This visit type was conducted due to national recommendations for restrictions regarding the COVID-19 pandemic (e.g. social distancing).  This format is felt to be most appropriate for this patient at this time.  All issues noted in this document were discussed and addressed.  No physical exam was performed (except for noted visual exam findings with Video Visits).   I connected with Christine Simmons today at 10:20 AM EST by a video enabled telemedicine application or telephone and verified that I am speaking with the correct person using two identifiers. Location patient: home Location provider: work or home office Persons participating in the virtual visit: patient, provider  I discussed the limitations, risks, security and privacy concerns of performing an evaluation and management service by video and the availability of in person appointments. I also discussed with the patient that there may be a patient responsible charge related to this service. The patient expressed understanding and agreed to proceed.  HPI: Patient and I connected via video to follow-up on Cymbalta and chronic neck, mid and low back pain.  Patient overall feels Cymbalta 60 mg is helpful to reduce her pain overall.  She did have neck surgery and is feeling better after the surgery.  States her neck is in less pain and arm strength feels improved.  Continues to struggle with mid and low back pain.  She is working with neurosurgeon and physical therapy to help figure out because of this pain.  She continues to remain out of work, plan possibly to start back on the 23rd.  Denies any SI or HI.  Overall feels the Cymbalta helps control her mood as well as improve her pain.  States that she feels a lot at times when she is down it is directly related to her pain not being greatly controlled.  Is happy that the Cymbalta  has been helpful in combating both her pain and mood issues.   ROS: See pertinent positives and negatives per HPI.  Past Medical History:  Diagnosis Date  . Anxiety   . Arthritis    low back and neck  . Chronic kidney disease    kidney stones and infection  . Degenerative disc disease, cervical   . Endometriosis   . Family history of adverse reaction to anesthesia    "unable to put mother to sleep"  . Frequent headaches   . GERD (gastroesophageal reflux disease)   . Lichen   . Lower back pain   . Menorrhagia   . Neck pain   . Ovarian cyst     Past Surgical History:  Procedure Laterality Date  . ABDOMINAL HYSTERECTOMY    . ANKLE SURGERY Left   . CERVICAL DISC ARTHROPLASTY N/A 08/03/2019   Procedure: CERVICAL ANTERIOR DISC ARTHROPLASTY C4-6;  Surgeon: Venetia Night, MD;  Location: [facility] ORS;  Service: Neurosurgery;  Laterality: N/A;  . CESAREAN SECTION    . CHOLECYSTECTOMY N/A 04/10/2015   Procedure: LAPAROSCOPIC CHOLECYSTECTOMY WITH INTRAOPERATIVE CHOLANGIOGRAM;  Surgeon: Lattie Haw, MD;  Location: [facility] ORS;  Service: General;  Laterality: N/A;  . ESOPHAGOGASTRODUODENOSCOPY (EGD) WITH PROPOFOL N/A 12/12/2016   Procedure: ESOPHAGOGASTRODUODENOSCOPY (EGD) WITH PROPOFOL;  Surgeon: Christena Deem, MD;  Location: [facility] ENDOSCOPY;  Service: Endoscopy;  Laterality: N/A;  . GALLBLADDER SURGERY    . LAPAROSCOPIC VAGINAL HYSTERECTOMY WITH SALPINGECTOMY Bilateral 07/07/2016   Procedure: LAPAROSCOPIC ASSISTED VAGINAL HYSTERECTOMY  WITH SALPINGECTOMY;  Surgeon: Boykin Nearing, MD;  Location: [facility] ORS;  Service: Gynecology;  Laterality: Bilateral;    Family History  Problem Relation Age of Onset  . Hypertension Mother   . COPD Mother   . Arthritis Mother   . Gallbladder disease Maternal Grandmother   . Heart disease Maternal Grandmother   . Hyperlipidemia Maternal Grandmother   . Intellectual disability Maternal Grandmother   . Hypertension Maternal Grandmother   .  Heart attack Maternal Grandmother   . Hypertension Father   . Alcohol abuse Maternal Grandfather   . Cancer Maternal Grandfather   . COPD Maternal Grandfather   . Autoimmune disease Paternal Aunt    Social History   Tobacco Use  . Smoking status: Former Smoker    Quit date: 04/09/1997    Years since quitting: 22.4  . Smokeless tobacco: Never Used  Substance Use Topics  . Alcohol use: No    Current Outpatient Medications:  .  DULoxetine (CYMBALTA) 60 MG capsule, TAKE 1 CAPSULE BY MOUTH EVERY DAY, Disp: 90 capsule, Rfl: 0 .  methocarbamol (ROBAXIN) 500 MG tablet, Take 1 tablet (500 mg total) by mouth every 6 (six) hours as needed for muscle spasms., Disp: 80 tablet, Rfl: 0 .  naproxen (NAPROSYN) 500 MG tablet, Take 1 tablet (500 mg total) by mouth 2 (two) times daily with a meal., Disp: 60 tablet, Rfl: 0 .  pantoprazole (PROTONIX) 40 MG tablet, Take 40 mg by mouth 2 (two) times daily. , Disp: , Rfl:  .  benzonatate (TESSALON PERLES) 100 MG capsule, Take 1 capsule (100 mg total) by mouth 3 (three) times daily as needed. (Patient not taking: Reported on 09/05/2019), Disp: 20 capsule, Rfl: 0 .  Elagolix Sodium (ORILISSA PO), Take 1 tablet by mouth daily., Disp: , Rfl:   EXAM:  GENERAL: alert, oriented, appears well and in no acute distress  HEENT: atraumatic, conjunttiva clear, no obvious abnormalities on inspection of external nose and ears  NECK: normal movements of the head and neck  LUNGS: on inspection no signs of respiratory distress, breathing rate appears normal, no obvious gross SOB, gasping or wheezing  CV: no obvious cyanosis  MS: moves all visible extremities without noticeable abnormality  PSYCH/NEURO: pleasant and cooperative, no obvious depression or anxiety, speech and thought processing grossly intact  ASSESSMENT AND PLAN:  Discussed the following assessment and plan:  Cervical radiculopathy - Plan: DULoxetine (CYMBALTA) 60 MG capsule  Chronic bilateral low  back pain, unspecified whether sciatica present - Plan: DULoxetine (CYMBALTA) 60 MG capsule  Chronic bilateral thoracic back pain - Plan: DULoxetine (CYMBALTA) 60 MG capsule  She will continue Cymbalta at this time.  Refill sent in.  She will continue working with her surgeon and physical therapy in regards to her other pain.  She will tentatively return to work on the 23rd November.   I discussed the assessment and treatment plan with the patient. The patient was provided an opportunity to ask questions and all were answered. The patient agreed with the plan and demonstrated an understanding of the instructions.   The patient was advised to call back or seek an in-person evaluation if the symptoms worsen/things change.   Jodelle Green, FNP

## 2019-09-07 DIAGNOSIS — M533 Sacrococcygeal disorders, not elsewhere classified: Secondary | ICD-10-CM | POA: Diagnosis not present

## 2019-09-07 DIAGNOSIS — M47816 Spondylosis without myelopathy or radiculopathy, lumbar region: Secondary | ICD-10-CM | POA: Diagnosis not present

## 2019-09-07 DIAGNOSIS — M5136 Other intervertebral disc degeneration, lumbar region: Secondary | ICD-10-CM | POA: Diagnosis not present

## 2019-09-07 DIAGNOSIS — M5441 Lumbago with sciatica, right side: Secondary | ICD-10-CM | POA: Diagnosis not present

## 2019-09-08 DIAGNOSIS — Z981 Arthrodesis status: Secondary | ICD-10-CM | POA: Diagnosis not present

## 2019-09-08 DIAGNOSIS — M4322 Fusion of spine, cervical region: Secondary | ICD-10-CM | POA: Diagnosis not present

## 2019-09-08 DIAGNOSIS — G959 Disease of spinal cord, unspecified: Secondary | ICD-10-CM | POA: Diagnosis not present

## 2019-09-28 ENCOUNTER — Other Ambulatory Visit: Payer: Self-pay

## 2019-09-28 DIAGNOSIS — Z20822 Contact with and (suspected) exposure to covid-19: Secondary | ICD-10-CM

## 2019-09-30 LAB — NOVEL CORONAVIRUS, NAA: SARS-CoV-2, NAA: NOT DETECTED

## 2019-10-06 DIAGNOSIS — M62838 Other muscle spasm: Secondary | ICD-10-CM | POA: Diagnosis not present

## 2019-10-06 DIAGNOSIS — G959 Disease of spinal cord, unspecified: Secondary | ICD-10-CM | POA: Diagnosis not present

## 2019-10-06 DIAGNOSIS — M25511 Pain in right shoulder: Secondary | ICD-10-CM | POA: Diagnosis not present

## 2019-10-06 DIAGNOSIS — M542 Cervicalgia: Secondary | ICD-10-CM | POA: Diagnosis not present

## 2019-10-06 DIAGNOSIS — M7541 Impingement syndrome of right shoulder: Secondary | ICD-10-CM | POA: Diagnosis not present

## 2019-10-06 DIAGNOSIS — G8929 Other chronic pain: Secondary | ICD-10-CM | POA: Diagnosis not present

## 2019-10-07 ENCOUNTER — Other Ambulatory Visit: Payer: Self-pay | Admitting: Neurosurgery

## 2019-10-07 DIAGNOSIS — G959 Disease of spinal cord, unspecified: Secondary | ICD-10-CM

## 2019-10-19 ENCOUNTER — Telehealth: Payer: BC Managed Care – PPO

## 2019-10-19 ENCOUNTER — Ambulatory Visit: Payer: BC Managed Care – PPO

## 2019-11-01 ENCOUNTER — Ambulatory Visit
Admission: RE | Admit: 2019-11-01 | Discharge: 2019-11-01 | Disposition: A | Discharge status: Home / Self Care | Payer: BC Managed Care – PPO | Source: Ambulatory Visit | Attending: Neurosurgery | Admitting: Neurosurgery

## 2019-11-01 ENCOUNTER — Other Ambulatory Visit: Payer: Self-pay

## 2019-11-01 DIAGNOSIS — G959 Disease of spinal cord, unspecified: Secondary | ICD-10-CM | POA: Diagnosis not present

## 2019-11-01 DIAGNOSIS — M4322 Fusion of spine, cervical region: Secondary | ICD-10-CM | POA: Diagnosis not present

## 2019-11-01 DIAGNOSIS — M50221 Other cervical disc displacement at C4-C5 level: Secondary | ICD-10-CM | POA: Diagnosis not present

## 2019-11-01 IMAGING — MR MR CERVICAL SPINE W/O CM
5 of 6 series · 34 of 48 positions shown · non-contrast
Comparison: Cervical radiographs [DATE]. Cervical MRI
[DATE]

CLINICAL DATA: Cervical spine fusion [DATE].  Right arm pain

EXAM:
MRI CERVICAL SPINE WITHOUT CONTRAST
TECHNIQUE: Multiplanar, multisequence MR imaging of the cervical spine was
performed. No intravenous contrast was administered.

[Series 5: T2 · sagittal · 3.0mm · 0.62mm/px · 5 of 15 slices shown (1 of 2)]
[im 1/15]
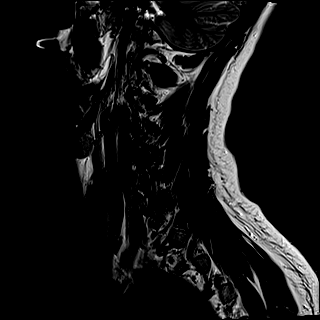
[im 4/15]
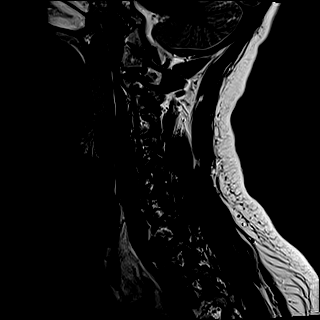
[im 8/15]
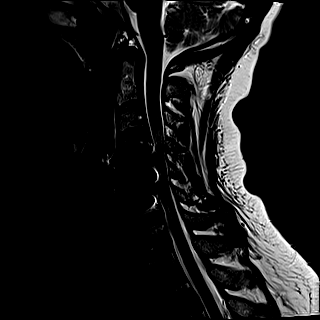
[im 11/15]
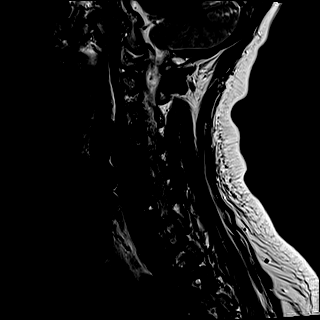
[im 15/15]
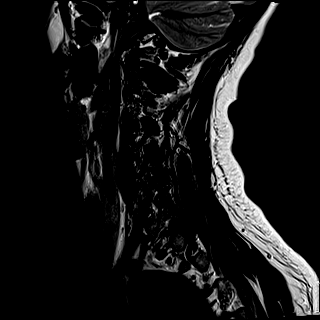

[Series 6: FLAIR · sagittal · 3.0mm · 0.78mm/px · 2 of 15 slices shown]
[im 1/15]
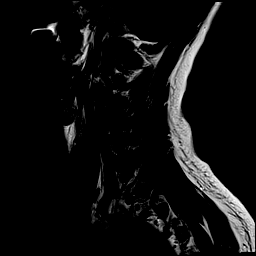
[im 4/15]
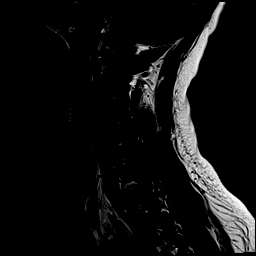

[Series 7: STIR · sagittal · 3.0mm · 0.62mm/px · 5 of 15 slices shown]
[im 1/15]
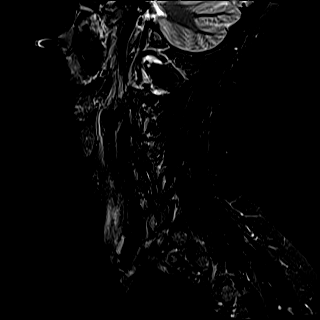
[im 4/15]
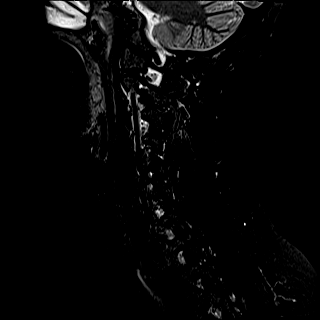
[im 8/15]
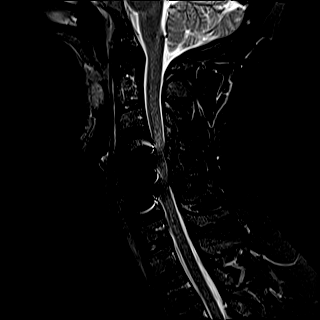
[im 11/15]
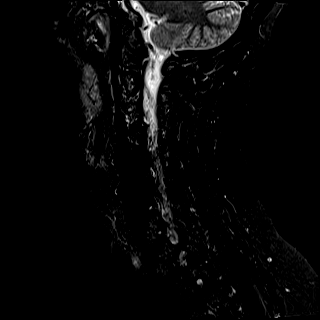
[im 15/15]
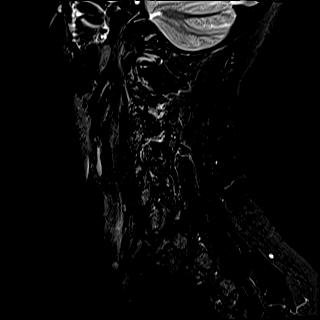

[Series 8: T2 · axial · 3.0mm · 0.70mm/px · z∈[-110,-9]mm · 11 of 30 slices shown (2 of 2)]
[im 1/30]
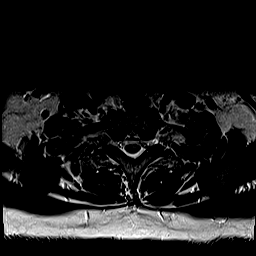
[im 3/30]
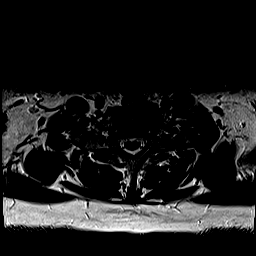
[im 6/30]
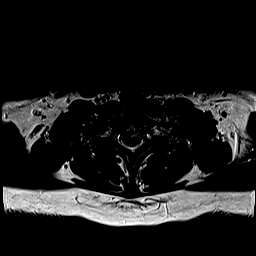
[im 9/30]
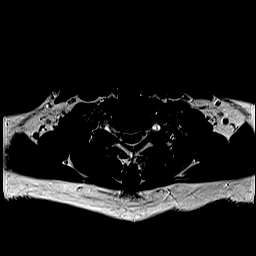
[im 12/30]
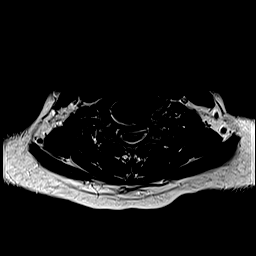
[im 15/30]
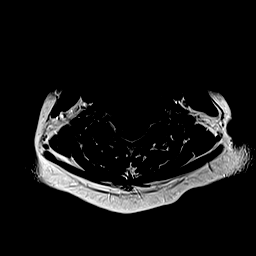
[im 18/30]
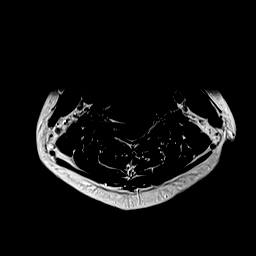
[im 21/30]
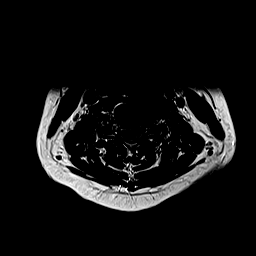
[im 24/30]
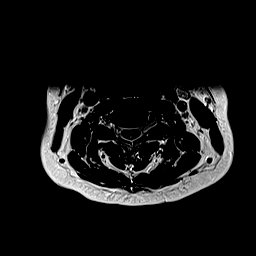
[im 27/30]
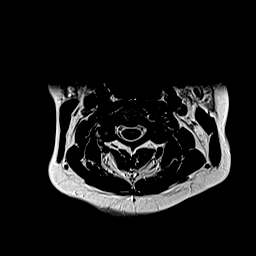
[im 30/30]
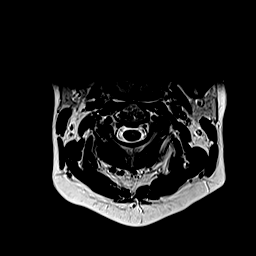

[Series 10: T1 · axial · non-contrast · 3.0mm · 0.35mm/px · z∈[-110,-9]mm · 11 of 30 slices shown]
[im 1/30]
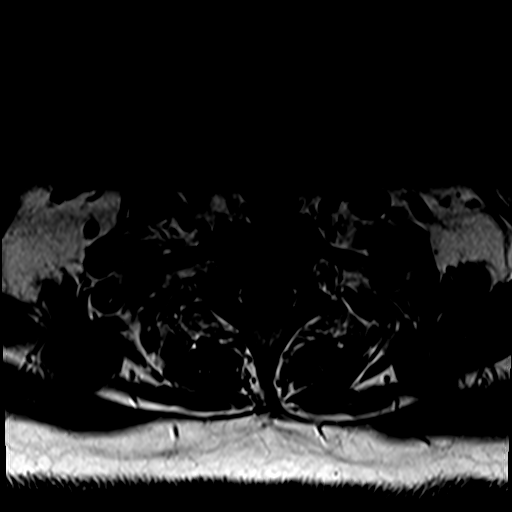
[im 3/30]
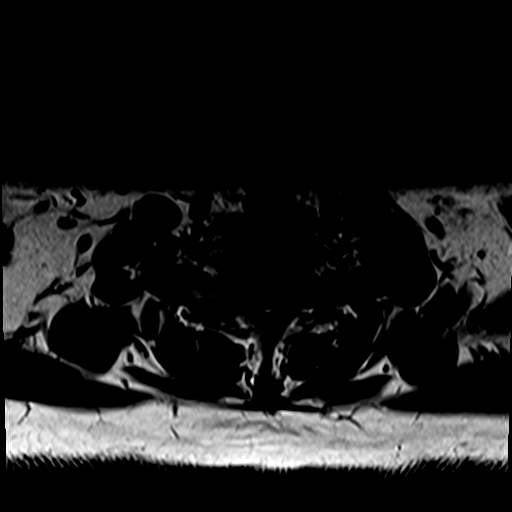
[im 6/30]
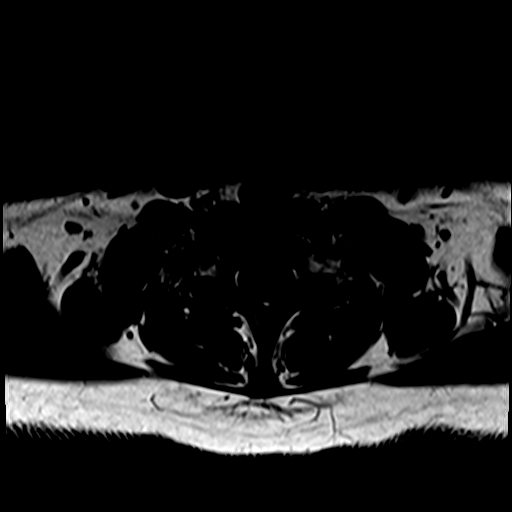
[im 9/30]
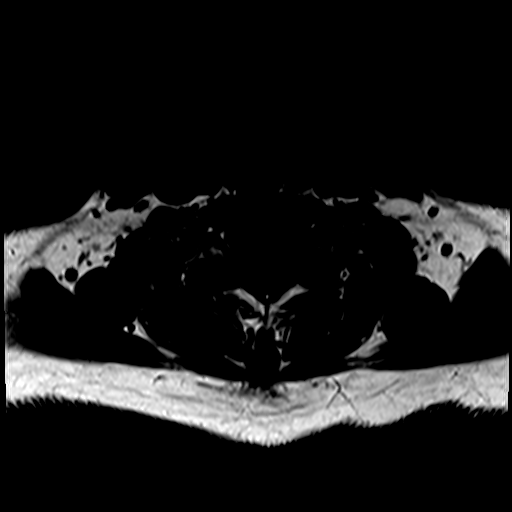
[im 12/30]
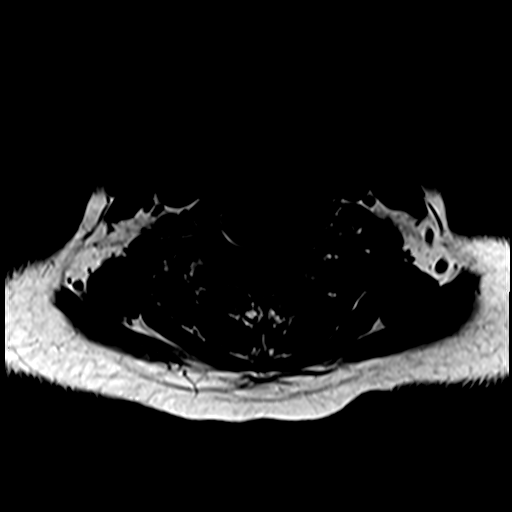
[im 15/30]
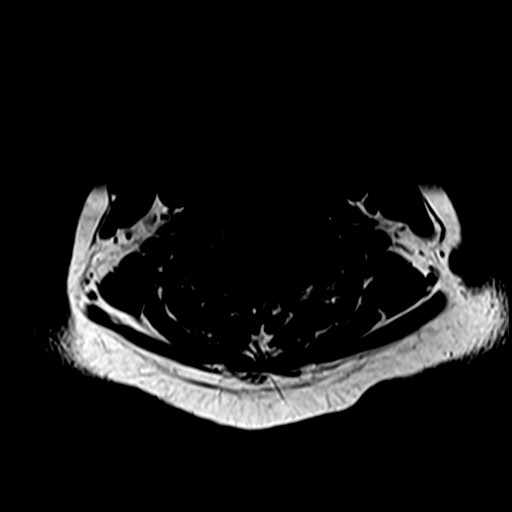
[im 18/30]
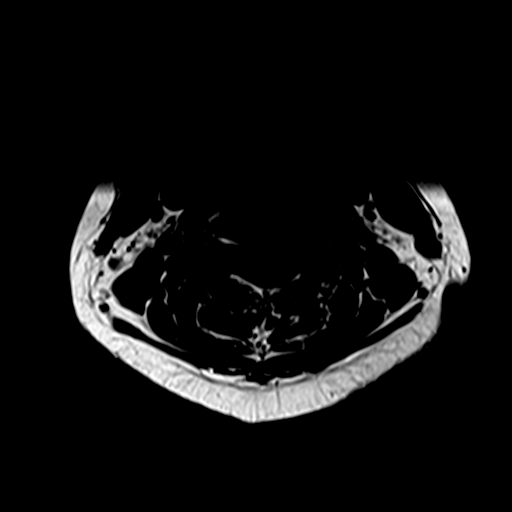
[im 21/30]
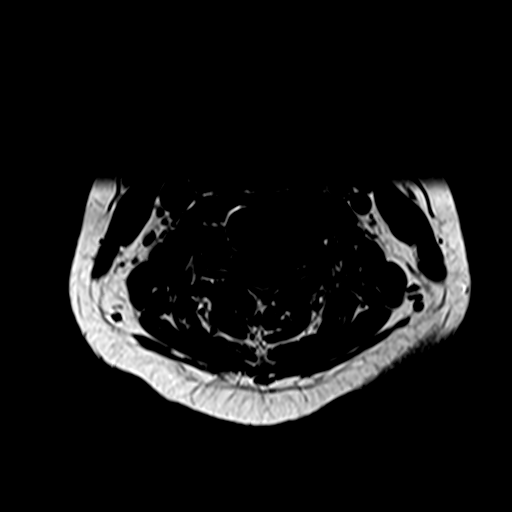
[im 24/30]
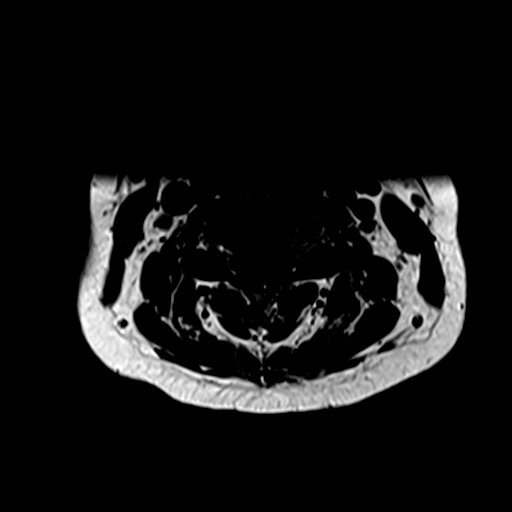
[im 27/30]
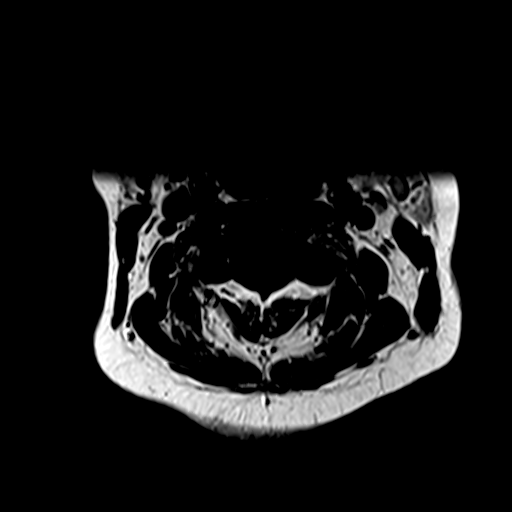
[im 30/30]
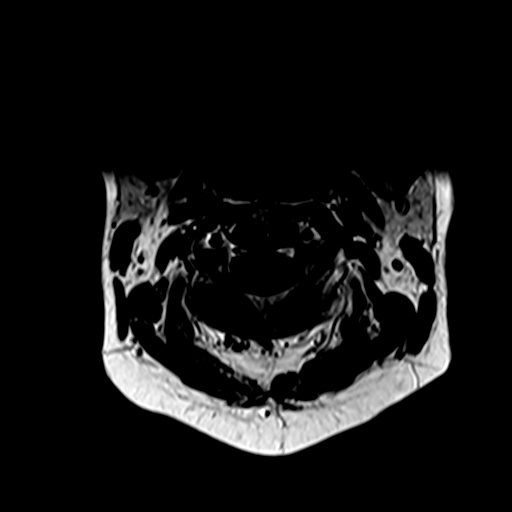

[34 of 48 positions shown; findings below may reference images not displayed]

FINDINGS: Alignment: Alignment difficult evaluated C4-5 due to extensive
artifact from disc prosthesis at C4-5 and C5-6. Otherwise normal
alignment

Vertebrae: C4, C5, and C6 are obscured by extensive hardware related
artifact. Otherwise negative vertebral signal.

Cord: Cord evaluation is limited at C4-5 and C5-6 due to hardware.
Otherwise no cord signal abnormality.

Posterior Fossa, vertebral arteries, paraspinal tissues: Negative

Disc levels:

C2-3: Negative

C3-4: Negative

C4-5: Prosthetic disc causes significant artifact obscuring the
canal and foramen.

C5-6: Prosthetic disc causes significant artifact obscuring canal
and foramen.

C6-7: Mild disc bulging without stenosis.

C7-T1: Minimal central disc protrusion.  Negative for stenosis.
IMPRESSION: Intervertebral disc hardware at C4-5 and C5-6 causes significant
artifact on MRI obscuring much of the cord and canal at these
levels.

No significant stenosis above or below the fusion.

## 2019-11-09 ENCOUNTER — Other Ambulatory Visit: Payer: Self-pay | Admitting: Student

## 2019-11-09 DIAGNOSIS — K219 Gastro-esophageal reflux disease without esophagitis: Secondary | ICD-10-CM | POA: Diagnosis not present

## 2019-11-09 DIAGNOSIS — R1312 Dysphagia, oropharyngeal phase: Secondary | ICD-10-CM | POA: Diagnosis not present

## 2019-11-09 DIAGNOSIS — Z981 Arthrodesis status: Secondary | ICD-10-CM | POA: Diagnosis not present

## 2019-11-17 ENCOUNTER — Ambulatory Visit: Payer: BC Managed Care – PPO

## 2019-11-17 DIAGNOSIS — B349 Viral infection, unspecified: Secondary | ICD-10-CM | POA: Diagnosis not present

## 2019-12-01 ENCOUNTER — Other Ambulatory Visit: Payer: Self-pay | Admitting: Family Medicine

## 2019-12-01 DIAGNOSIS — M5412 Radiculopathy, cervical region: Secondary | ICD-10-CM

## 2019-12-01 DIAGNOSIS — M545 Low back pain, unspecified: Secondary | ICD-10-CM

## 2019-12-01 DIAGNOSIS — G8929 Other chronic pain: Secondary | ICD-10-CM

## 2019-12-01 DIAGNOSIS — M546 Pain in thoracic spine: Secondary | ICD-10-CM

## 2019-12-01 NOTE — Telephone Encounter (Signed)
Pt needs a refill on DULoxetine (CYMBALTA) 60 MG capsule sent to [facility] in [place] will set up a TOC appt with K.Mills when schedule is open

## 2019-12-02 ENCOUNTER — Ambulatory Visit
Admission: RE | Admit: 2019-12-02 | Discharge: 2019-12-02 | Disposition: A | Discharge status: Home / Self Care | Payer: BC Managed Care – PPO | Source: Ambulatory Visit | Attending: Student | Admitting: Student

## 2019-12-02 ENCOUNTER — Other Ambulatory Visit: Payer: Self-pay

## 2019-12-02 DIAGNOSIS — R1312 Dysphagia, oropharyngeal phase: Secondary | ICD-10-CM

## 2019-12-02 DIAGNOSIS — Z981 Arthrodesis status: Secondary | ICD-10-CM | POA: Diagnosis not present

## 2019-12-02 DIAGNOSIS — R1310 Dysphagia, unspecified: Secondary | ICD-10-CM | POA: Diagnosis not present

## 2019-12-02 DIAGNOSIS — K219 Gastro-esophageal reflux disease without esophagitis: Secondary | ICD-10-CM | POA: Diagnosis not present

## 2019-12-02 IMAGING — RF DG ESOPHAGUS
10 of 12 series · 14 of 18 positions shown · non-contrast
Comparison: CT chest report [DATE].

CLINICAL DATA: Dysphagia.

EXAM:
ESOPHOGRAM / BARIUM SWALLOW / BARIUM TABLET STUDY
TECHNIQUE: Combined double contrast and single contrast examination performed
using effervescent crystals, thick barium liquid, and thin barium
liquid. The patient was observed with fluoroscopy swallowing a 13 mm
barium sulphate tablet.
FLUOROSCOPY TIME:  Fluoroscopy Time:  1 minutes 12 seconds
Radiation Exposure Index (if provided by the fluoroscopic device):
25.9 mGy
Number of Acquired Spot Images: 24

[Series 1: fluoro_barium 2fps_bw · 0.18mm/px · 2 of 7 frames shown (1 of 10)]
[frame 2/7]
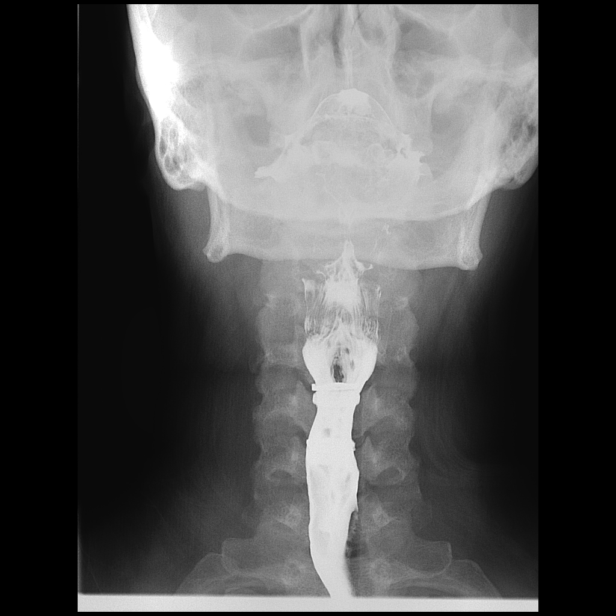
[frame 4/7]
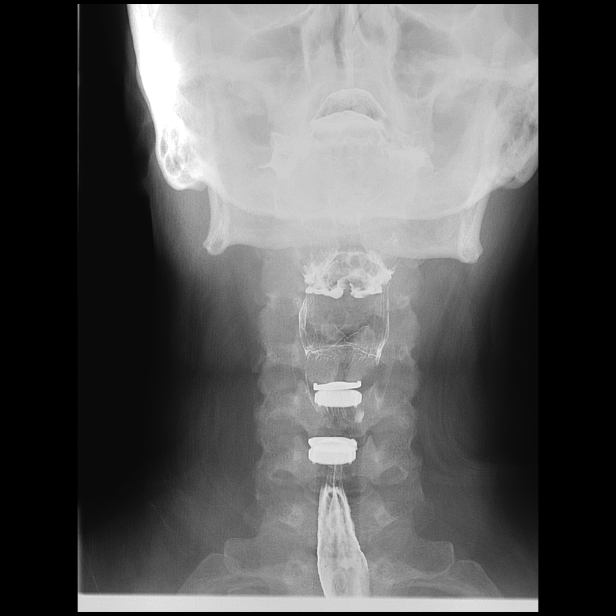

[Series 2: fluoro_barium 2fps_bw · 0.18mm/px · 3 of 6 frames shown (2 of 10)]
[frame 1/6]
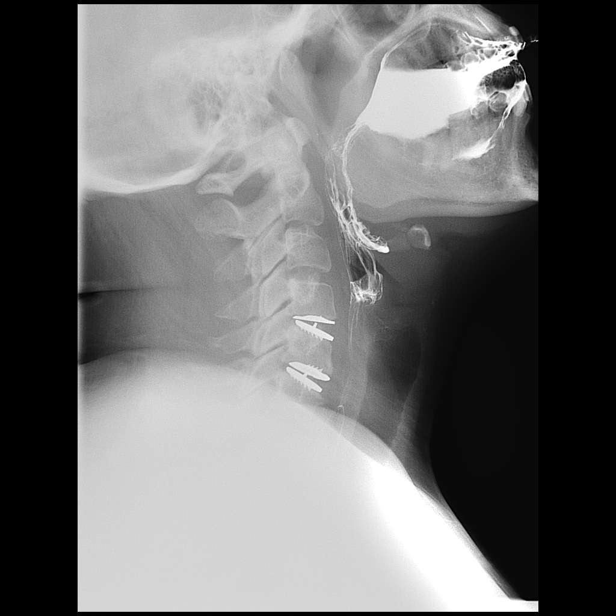
[frame 2/6]
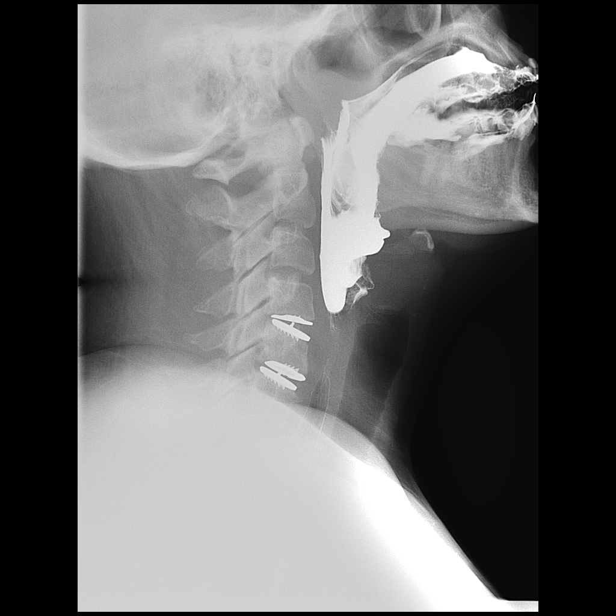
[frame 4/6]
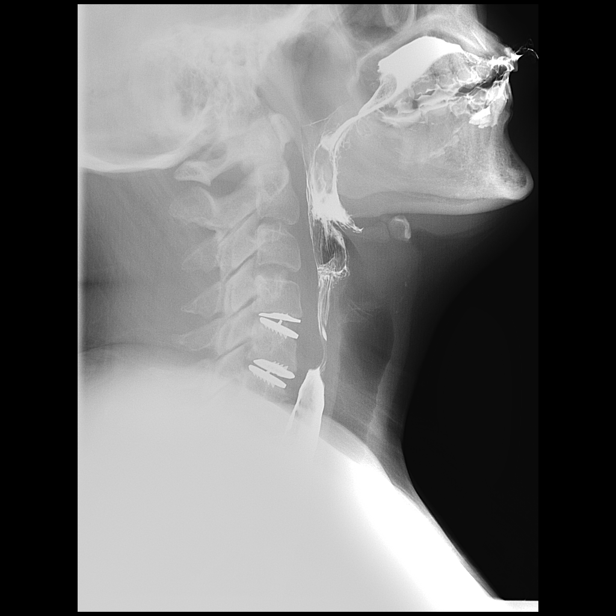

[Series 3: fluoro_barium 2fps_bw · 0.18mm/px · 1 of 1 slices shown (3 of 10)]
[im 1/1]
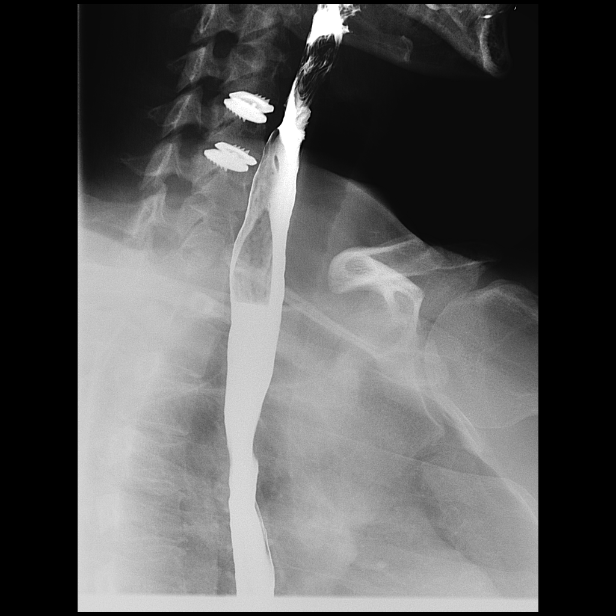

[Series 4: fluoro_barium 2fps_bw · 0.18mm/px · 1 of 1 slices shown (4 of 10)]
[im 1/1]
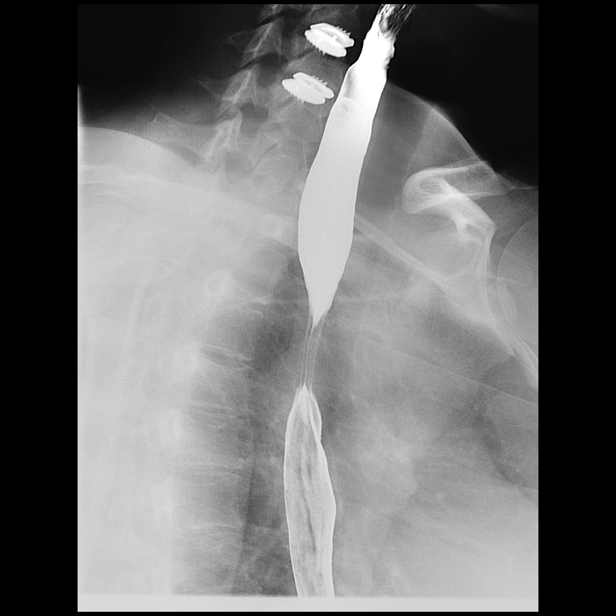

[Series 5: fluoro_barium 2fps_bw · 0.18mm/px · 1 of 1 slices shown (5 of 10)]
[im 1/1]
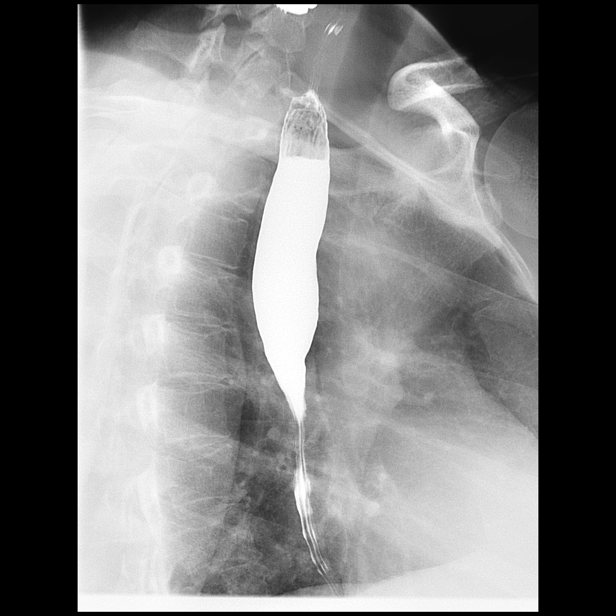

[Series 6: fluoro_barium 2fps_bw · 0.18mm/px · 1 of 1 slices shown (6 of 10)]
[im 1/1]
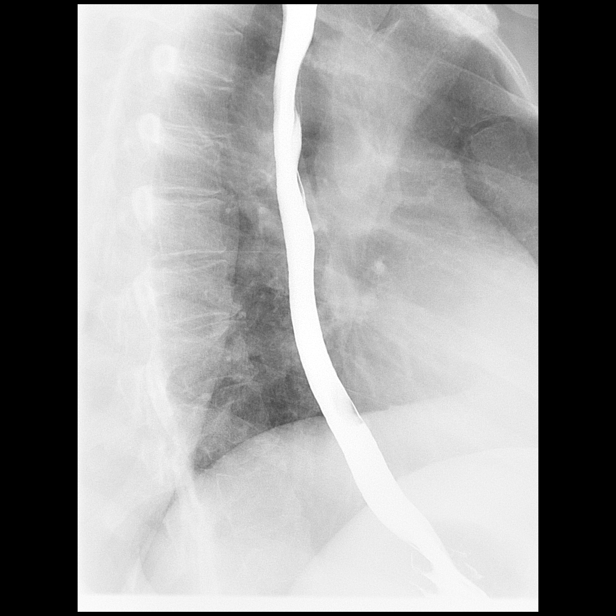

[Series 8: fluoro_barium 2fps_bw · 0.18mm/px · 1 of 1 slices shown (7 of 10)]
[im 1/1]
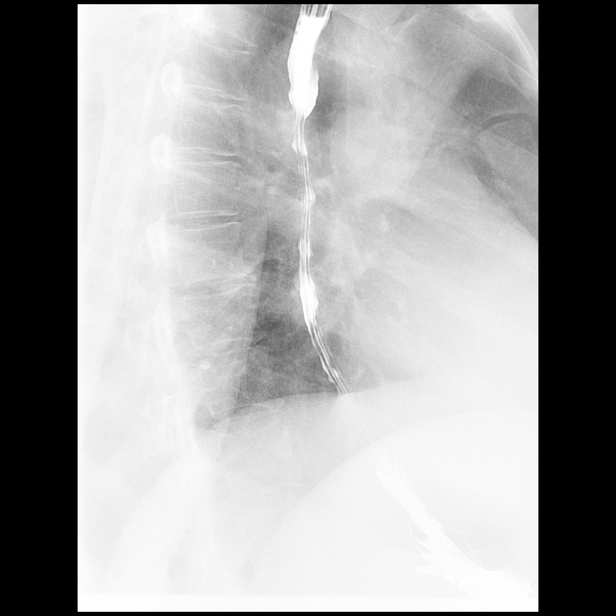

[Series 9: fluoro_barium 2fps_bw · 0.18mm/px · 2 of 2 frames shown (8 of 10)]
[frame 1/2]
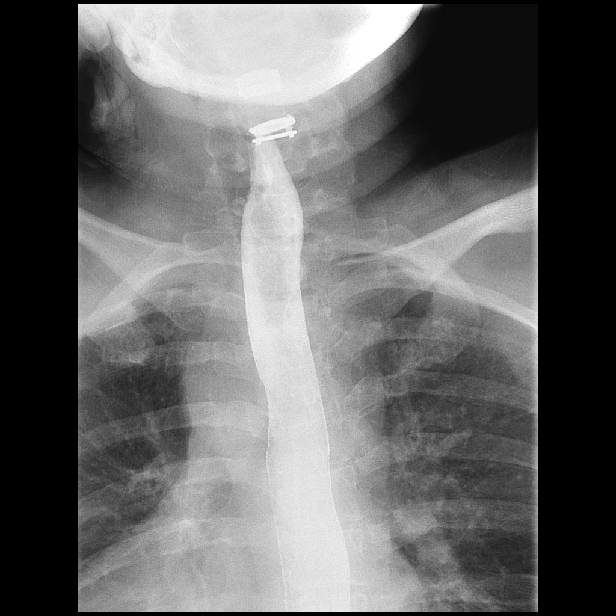
[frame 2/2]
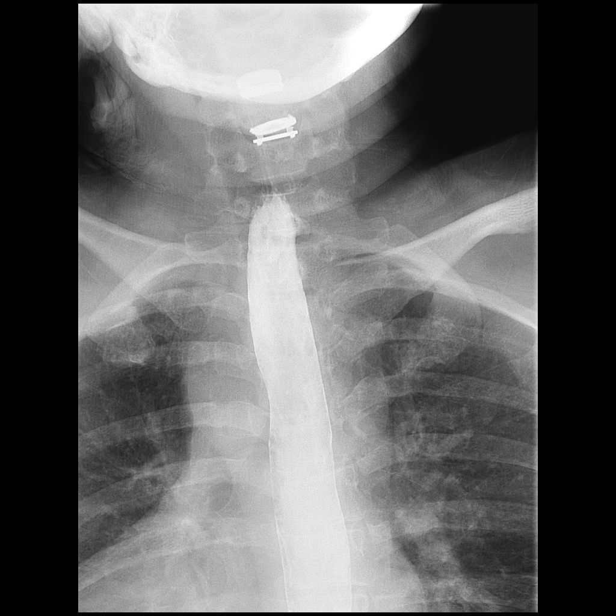

[Series 11: fluoro_barium 2fps_bw · 0.18mm/px · 1 of 1 slices shown (9 of 10)]
[im 1/1]
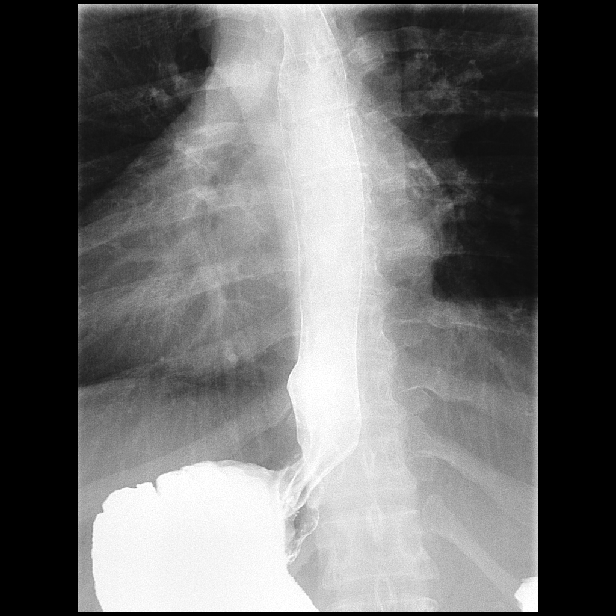

[Series 12: fluoro_barium 2fps_bw · 0.18mm/px · 1 of 1 slices shown (10 of 10)]
[im 1/1]
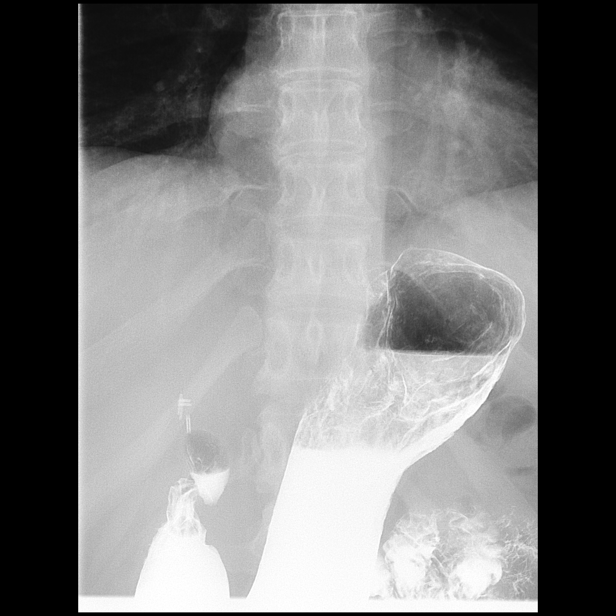

[14 of 18 positions shown; findings below may reference images not displayed]

FINDINGS: Prior cervical spine fusion cervical esophagus appears normal. No
evidence of aspiration. Thoracic esophagus appears normal. No
obstructing abnormality identified. Peristalsis is normal. No hiatal
hernia or reflux. Standardized barium tablet passes normally.
IMPRESSION: Normal exam.

## 2019-12-06 MED ORDER — DULOXETINE HCL 60 MG PO CPEP: 60.0000 mg | ORAL_CAPSULE | Freq: Every day | ORAL | 0 refills | Status: DC

## 2019-12-06 NOTE — Telephone Encounter (Signed)
Patient to schedule with new NP on arrival [facility] to fill Cymbalta until establishes in PCP name last until schedules with NP for 30 days only?

## 2019-12-06 NOTE — Addendum Note (Signed)
Addended by: Dennie Bible on: 12/06/2019 10:27 AM   Modules accepted: Orders

## 2019-12-07 ENCOUNTER — Other Ambulatory Visit: Payer: Self-pay | Admitting: Student

## 2019-12-07 DIAGNOSIS — Z981 Arthrodesis status: Secondary | ICD-10-CM

## 2019-12-07 DIAGNOSIS — R1312 Dysphagia, oropharyngeal phase: Secondary | ICD-10-CM

## 2019-12-19 DIAGNOSIS — R202 Paresthesia of skin: Secondary | ICD-10-CM | POA: Diagnosis not present

## 2019-12-19 DIAGNOSIS — R200 Anesthesia of skin: Secondary | ICD-10-CM | POA: Diagnosis not present

## 2019-12-20 ENCOUNTER — Ambulatory Visit: Admission: RE | Admit: 2019-12-20 | Payer: BC Managed Care – PPO | Source: Ambulatory Visit

## 2019-12-23 ENCOUNTER — Encounter: Payer: Self-pay | Admitting: Podiatry

## 2019-12-26 NOTE — Telephone Encounter (Signed)
Appointment scheduled for January 10, 2020 @2 :15

## 2020-01-10 ENCOUNTER — Ambulatory Visit: Payer: BC Managed Care – PPO | Admitting: Podiatry

## 2020-01-17 ENCOUNTER — Ambulatory Visit: Payer: BC Managed Care – PPO | Admitting: Podiatry

## 2020-01-21 ENCOUNTER — Encounter: Payer: Self-pay | Admitting: Podiatry

## 2020-02-07 ENCOUNTER — Ambulatory Visit: Payer: BC Managed Care – PPO | Admitting: Podiatry

## 2020-02-09 DIAGNOSIS — M4322 Fusion of spine, cervical region: Secondary | ICD-10-CM | POA: Diagnosis not present

## 2020-02-09 DIAGNOSIS — M25551 Pain in right hip: Secondary | ICD-10-CM | POA: Diagnosis not present

## 2020-02-09 DIAGNOSIS — G959 Disease of spinal cord, unspecified: Secondary | ICD-10-CM | POA: Diagnosis not present

## 2020-02-09 DIAGNOSIS — G8929 Other chronic pain: Secondary | ICD-10-CM | POA: Diagnosis not present

## 2020-03-09 ENCOUNTER — Encounter: Payer: Self-pay | Admitting: Emergency Medicine

## 2020-03-09 ENCOUNTER — Emergency Department: Payer: BC Managed Care – PPO

## 2020-03-09 ENCOUNTER — Emergency Department
Admission: EM | Admit: 2020-03-09 | Discharge: 2020-03-09 | Disposition: A | Discharge status: Home / Self Care | Payer: BC Managed Care – PPO | Attending: Emergency Medicine | Admitting: Emergency Medicine

## 2020-03-09 ENCOUNTER — Other Ambulatory Visit: Payer: Self-pay

## 2020-03-09 DIAGNOSIS — F419 Anxiety disorder, unspecified: Secondary | ICD-10-CM | POA: Diagnosis not present

## 2020-03-09 DIAGNOSIS — W230XXA Caught, crushed, jammed, or pinched between moving objects, initial encounter: Secondary | ICD-10-CM | POA: Insufficient documentation

## 2020-03-09 DIAGNOSIS — Y92810 Car as the place of occurrence of the external cause: Secondary | ICD-10-CM | POA: Diagnosis not present

## 2020-03-09 DIAGNOSIS — S60011A Contusion of right thumb without damage to nail, initial encounter: Secondary | ICD-10-CM | POA: Diagnosis not present

## 2020-03-09 DIAGNOSIS — Z79899 Other long term (current) drug therapy: Secondary | ICD-10-CM | POA: Insufficient documentation

## 2020-03-09 DIAGNOSIS — S62521A Displaced fracture of distal phalanx of right thumb, initial encounter for closed fracture: Secondary | ICD-10-CM | POA: Insufficient documentation

## 2020-03-09 DIAGNOSIS — Y9389 Activity, other specified: Secondary | ICD-10-CM | POA: Diagnosis not present

## 2020-03-09 DIAGNOSIS — S62639A Displaced fracture of distal phalanx of unspecified finger, initial encounter for closed fracture: Secondary | ICD-10-CM

## 2020-03-09 DIAGNOSIS — Z87891 Personal history of nicotine dependence: Secondary | ICD-10-CM | POA: Insufficient documentation

## 2020-03-09 DIAGNOSIS — Y999 Unspecified external cause status: Secondary | ICD-10-CM | POA: Insufficient documentation

## 2020-03-09 DIAGNOSIS — M79644 Pain in right finger(s): Secondary | ICD-10-CM | POA: Diagnosis not present

## 2020-03-09 DIAGNOSIS — S6991XA Unspecified injury of right wrist, hand and finger(s), initial encounter: Secondary | ICD-10-CM | POA: Diagnosis not present

## 2020-03-09 IMAGING — DX DG FINGER THUMB 2+V*R*
3 series · 3 of 3 positions shown · non-contrast
Comparison: None.

CLINICAL DATA: Pain after closing thumb in car door

EXAM:
RIGHT THUMB 2+V

[finger ap]
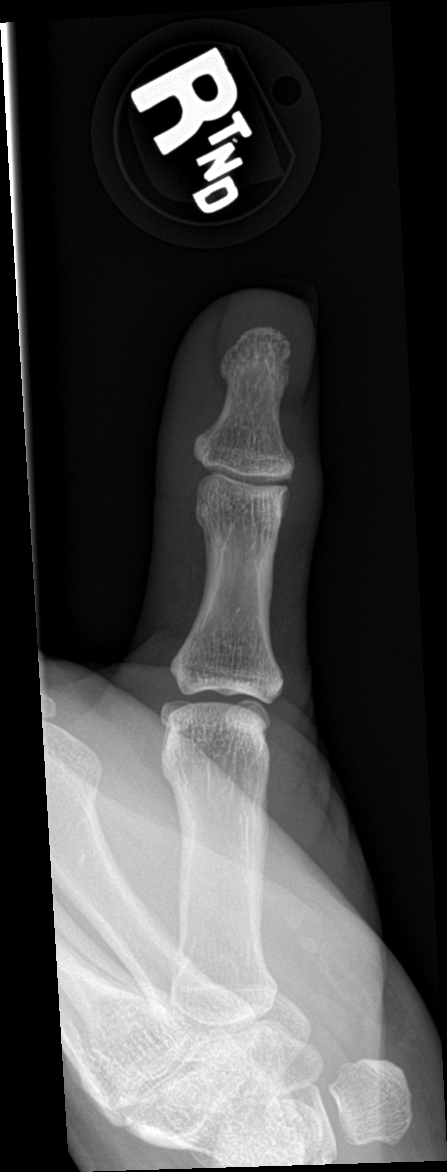

[finger obl]
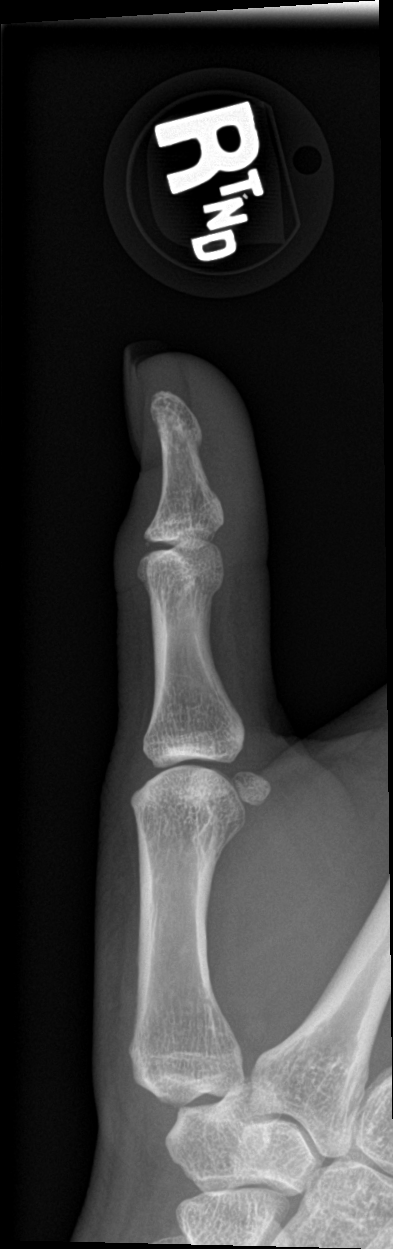

[finger lat]
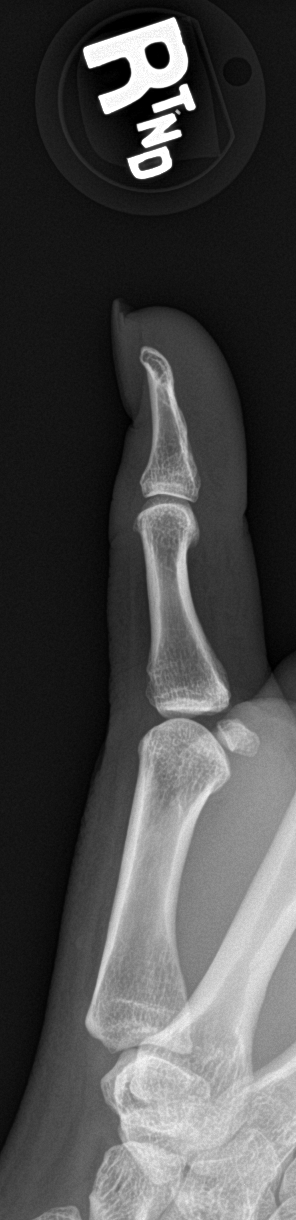

[3 of 3 positions shown; findings below may reference images not displayed]

FINDINGS: Frontal, oblique, and lateral views obtained. There is a tiny
calcification in the dorsal aspect of the first IP joint. Question
arthropathic etiology versus minimal avulsion in this area. No other
evidence of potential fracture. No dislocation. No joint space
narrowing or erosion.
IMPRESSION: Tiny calcification in the dorsal aspect of the first IP joint which
probably is of arthropathic etiology but conceivably could represent
a tiny avulsion. Beyond potential tiny avulsion in this area, no
evident fracture. No dislocation. No joint space narrowing.

## 2020-03-09 MED ORDER — NAPROXEN 500 MG PO TABS
500.0000 mg | ORAL_TABLET | Freq: Once | ORAL | Status: AC
  Administered 2020-03-09: 500 mg via ORAL
  Filled 2020-03-09: qty 1

## 2020-03-09 MED ORDER — IBUPROFEN 600 MG PO TABS: 600.0000 mg | ORAL_TABLET | Freq: Three times a day (TID) | ORAL | 0 refills | Status: DC | PRN

## 2020-03-09 MED ORDER — OXYCODONE-ACETAMINOPHEN 7.5-325 MG PO TABS: 1.0000 | ORAL_TABLET | Freq: Four times a day (QID) | ORAL | 0 refills | Status: DC | PRN

## 2020-03-09 MED ORDER — LIDOCAINE 5 % EX PTCH
1.0000 | MEDICATED_PATCH | Freq: Once | CUTANEOUS | Status: DC
  Administered 2020-03-09: 1 via TRANSDERMAL
  Filled 2020-03-09: qty 1

## 2020-03-09 NOTE — Discharge Instructions (Addendum)
Follow discharge care instruction were explained for 3 to 5 days as needed.

## 2020-03-09 NOTE — ED Notes (Signed)
See triage note  States she shut her right thumb in car door 1 week ago

## 2020-03-09 NOTE — ED Provider Notes (Signed)
[facility] Emergency Department Provider Note   ____________________________________________   First MD Initiated Contact with Patient 03/09/20 1007     (approximate)  I have reviewed the triage vital signs and the nursing notes.   HISTORY  Chief Complaint Hand Pain (R thumb pain)    HPI Christine Simmons is a 44 y.o. female patient complain of right thumb pain secondary to contusion by car door.  Incident occurred prior to arrival.  Patient denies loss sensation but states decreased range of motion flexion of the distal phalanx.  Patient rates pain as a 5/10.  Patient described pain as "achy".  No palliative measure prior to arrival.  Patient is right-hand dominant.         Past Medical History:  Diagnosis Date  . Anxiety   . Arthritis    low back and neck  . Chronic kidney disease    kidney stones and infection  . Degenerative disc disease, cervical   . Endometriosis   . Family history of adverse reaction to anesthesia    "unable to put mother to sleep"  . Frequent headaches   . GERD (gastroesophageal reflux disease)   . Lichen   . Lower back pain   . Menorrhagia   . Neck pain   . Ovarian cyst     Patient Active Problem List   Diagnosis Date Noted  . S/P cervical spinal fusion 08/03/2019  . Chronic right hip pain 06/09/2019  . Degenerative disc disease, cervical 03/31/2019  . Menorrhagia with regular cycle 03/31/2019  . Pelvic pain in female 03/31/2019  . Cervical radiculopathy 08/10/2018  . Chronic bilateral low back pain 08/10/2018  . Chronic bilateral thoracic back pain 08/10/2018  . Family history of metabolic and nutritional disorder 08/10/2018  . Fatigue 08/10/2018  . Post-operative state 07/07/2016  . Cholelithiasis 04/10/2015  . Lichen [id number]/8144    Past Surgical History:  Procedure Laterality Date  . ABDOMINAL HYSTERECTOMY    . ANKLE SURGERY Left   . CERVICAL DISC ARTHROPLASTY N/A 08/03/2019   Procedure: CERVICAL  ANTERIOR [place] ARTHROPLASTY C4-6;  Surgeon: Meade Maw, MD;  Location: [facility] ORS;  Service: Neurosurgery;  Laterality: N/A;  . CESAREAN SECTION    . CHOLECYSTECTOMY N/A 04/10/2015   Procedure: LAPAROSCOPIC CHOLECYSTECTOMY WITH INTRAOPERATIVE CHOLANGIOGRAM;  Surgeon: Florene Glen, MD;  Location: [facility] ORS;  Service: General;  Laterality: N/A;  . ESOPHAGOGASTRODUODENOSCOPY (EGD) WITH PROPOFOL N/A 12/12/2016   Procedure: ESOPHAGOGASTRODUODENOSCOPY (EGD) WITH PROPOFOL;  Surgeon: Lollie Sails, MD;  Location: [facility] ENDOSCOPY;  Service: Endoscopy;  Laterality: N/A;  . GALLBLADDER SURGERY    . LAPAROSCOPIC VAGINAL HYSTERECTOMY WITH SALPINGECTOMY Bilateral 07/07/2016   Procedure: LAPAROSCOPIC ASSISTED VAGINAL HYSTERECTOMY WITH SALPINGECTOMY;  Surgeon: Boykin Nearing, MD;  Location: [facility] ORS;  Service: Gynecology;  Laterality: Bilateral;    Prior to Admission medications   Medication Sig Start Date End Date Taking? Authorizing Provider  DULoxetine (CYMBALTA) 60 MG capsule Take 1 capsule (60 mg total) by mouth daily. 12/06/19   Crecencio Mc, MD  Elagolix Sodium (ORILISSA PO) Take 1 tablet by mouth daily.    [provider]  ibuprofen (ADVIL) 600 MG tablet Take 1 tablet (600 mg total) by mouth every 8 (eight) hours as needed. 03/09/20   Sable Feil, PA-C  oxyCODONE-acetaminophen (PERCOCET) 7.5-325 MG tablet Take 1 tablet by mouth every 6 (six) hours as needed. 03/09/20   Sable Feil, PA-C    Allergies Sulfa antibiotics  Family History  Problem Relation  Age of Onset  . Hypertension Mother   . COPD Mother   . Arthritis Mother   . Gallbladder disease Maternal Grandmother   . Heart disease Maternal Grandmother   . Hyperlipidemia Maternal Grandmother   . Intellectual disability Maternal Grandmother   . Hypertension Maternal Grandmother   . Heart attack Maternal Grandmother   . Hypertension Father   . Alcohol abuse Maternal Grandfather   . Cancer Maternal Grandfather    . COPD Maternal Grandfather   . Autoimmune disease Paternal Aunt     Social History Social History   Tobacco Use  . Smoking status: Former Smoker    Quit date: 04/09/1997    Years since quitting: 22.9  . Smokeless tobacco: Never Used  Substance Use Topics  . Alcohol use: No  . Drug use: No    Review of Systems Constitutional: No fever/chills Eyes: No visual changes. ENT: No sore throat. Cardiovascular: Denies chest pain. Respiratory: Denies shortness of breath. Gastrointestinal: No abdominal pain.  No nausea, no vomiting.  No diarrhea.  No constipation. Genitourinary: Negative for dysuria. Musculoskeletal: Right thumb pain.   Skin: Negative for rash. Neurological: Negative for headaches, focal weakness or numbness. Psychiatric:  Anxiety Allergic/Immunilogical: Sulfur antibiotics ____________________________________________   PHYSICAL EXAM:  VITAL SIGNS: ED Triage Vitals  Enc Vitals Group     BP 03/09/20 0953 115/68     Pulse Rate 03/09/20 0953 89     Resp 03/09/20 0953 18     Temp 03/09/20 0953 (!) 97.5 F (36.4 C)     Temp Source 03/09/20 0953 Oral     SpO2 03/09/20 0953 98 %     Weight 03/09/20 0949 160 lb (72.6 kg)     Height 03/09/20 0949 5\' 3"  (1.6 m)     Head Circumference --      Peak Flow --      Pain Score 03/09/20 0949 5     Pain Loc --      Pain Edu? --      Excl. in GC? --    Constitutional: Alert and oriented. Well appearing and in no acute distress.  Anxious Cardiovascular: Normal rate, regular rhythm. Grossly normal heart sounds.  Good peripheral circulation. Respiratory: Normal respiratory effort.  No retractions. Lungs CTAB. Musculoskeletal: Ecchymosis and edema to right thumb.  Decreased range of motion with flexion of the D JP.   Neurologic:  Normal speech and language. No gross focal neurologic deficits are appreciated. No gait instability. Skin:  Skin is warm, dry and intact. No rash noted.  Edema and ecchymosis Psychiatric: Mood and  affect are normal. Speech and behavior are normal.  ____________________________________________   LABS (all labs ordered are listed, but only abnormal results are displayed)  Labs Reviewed - No data to display ____________________________________________  EKG   ____________________________________________  RADIOLOGY  ED MD interpretation:    Official radiology report(s): DG Finger Thumb Right  Result Date: 03/09/2020 CLINICAL DATA:  Pain after closing thumb in car door EXAM: RIGHT THUMB 2+V COMPARISON:  None. FINDINGS: Frontal, oblique, and lateral views obtained. There is a tiny calcification in the dorsal aspect of the first IP joint. Question arthropathic etiology versus minimal avulsion in this area. No other evidence of potential fracture. No dislocation. No joint space narrowing or erosion. IMPRESSION: Tiny calcification in the dorsal aspect of the first IP joint which probably is of arthropathic etiology but conceivably could represent a tiny avulsion. Beyond potential tiny avulsion in this area, no evident fracture. No dislocation. No  joint space narrowing. Electronically Signed   By: Bretta Bang III M.D.   On: 03/09/2020 10:31    ____________________________________________   PROCEDURES  Procedure(s) performed (including Critical Care):  Procedures   ____________________________________________   INITIAL IMPRESSION / ASSESSMENT AND PLAN / ED COURSE  As part of my medical decision making, I reviewed the following data within the electronic MEDICAL RECORD NUMBER     Patient presents with pain edema to right thumb secondary to contusion a car door.  X-ray is suspicious for small avulsion fracture.  Patient placed in a thumb spica splint and given discharge care instruction.  Patient advised to follow orthopedic if no improvement in 3 to 5 days.    Christine Simmons was evaluated in Emergency Department on 03/09/2020 for the symptoms described in the history of  present illness. She was evaluated in the context of the global COVID-19 pandemic, which necessitated consideration that the patient might be at risk for infection with the SARS-CoV-2 virus that causes COVID-19. Institutional protocols and algorithms that pertain to the evaluation of patients at risk for COVID-19 are in a state of rapid change based on information released by regulatory bodies including the CDC and federal and state organizations. These policies and algorithms were followed during the patient's care in the ED.       ____________________________________________   FINAL CLINICAL IMPRESSION(S) / ED DIAGNOSES  Final diagnoses:  Closed avulsion fracture of distal phalanx of finger, initial encounter     ED Discharge Orders         Ordered    oxyCODONE-acetaminophen (PERCOCET) 7.5-325 MG tablet  Every 6 hours PRN     03/09/20 1047    ibuprofen (ADVIL) 600 MG tablet  Every 8 hours PRN     03/09/20 1047           Note:  This document was prepared using Dragon voice recognition software and may include unintentional dictation errors.    Joni Reining, PA-C 03/09/20 1107    Emily Filbert, MD 03/09/20 [phone]

## 2020-06-23 ENCOUNTER — Telehealth: Payer: BC Managed Care – PPO | Admitting: Nurse Practitioner

## 2020-06-23 DIAGNOSIS — M545 Low back pain, unspecified: Secondary | ICD-10-CM

## 2020-06-23 DIAGNOSIS — R399 Unspecified symptoms and signs involving the genitourinary system: Secondary | ICD-10-CM

## 2020-06-23 NOTE — Progress Notes (Signed)
Based on what you shared with me it looks like you have urinary symptoms with associated back pain,that should be evaluated in a face to face office visit. Due to back pain you need to have a urinalysis and urine culture.    NOTE: If you entered your credit card information for this eVisit, you will not be charged. You may see a "hold" on your card for the $35 but that hold will drop off and you will not have a charge processed.  If you are having a true medical emergency please call 911.     For an urgent face to face visit, [place] has four urgent care centers for your convenience:    [facility]    [phone]                  Get Driving Directions  [address] [place], [place] [address]  10 am to 8 pm Monday-Friday  12 pm to 8 pm [facility] at [facility]  [phone]                  Get Driving Directions  [address] [place], [place] [address]  8 am to 8 pm Monday-Friday  9 am to 6 pm Saturday  11 am to 6 pm Sunday    [facility] at [facility]  [phone]                  Get Driving Directions   [address] [place], [place] [address]  8 am to 8 pm Monday-Friday  8 am to 4 pm [facility] Urgent Care at [facility] Directions  [phone]  [address] [place], [place] [address]   Monday-Friday, 12 PM to 6 PM    Your e-visit answers were reviewed by a board certified advanced clinical practitioner to complete your personal care plan.  Thank you for using e-Visits.

## 2020-08-19 ENCOUNTER — Emergency Department
Admission: EM | Admit: 2020-08-19 | Discharge: 2020-08-19 | Disposition: A | Discharge status: Home / Self Care | Payer: BC Managed Care – PPO | Attending: Emergency Medicine | Admitting: Emergency Medicine

## 2020-08-19 ENCOUNTER — Encounter: Payer: Self-pay | Admitting: Emergency Medicine

## 2020-08-19 ENCOUNTER — Other Ambulatory Visit: Payer: Self-pay

## 2020-08-19 DIAGNOSIS — N189 Chronic kidney disease, unspecified: Secondary | ICD-10-CM | POA: Insufficient documentation

## 2020-08-19 DIAGNOSIS — Z87891 Personal history of nicotine dependence: Secondary | ICD-10-CM | POA: Diagnosis not present

## 2020-08-19 DIAGNOSIS — R519 Headache, unspecified: Secondary | ICD-10-CM | POA: Diagnosis not present

## 2020-08-19 DIAGNOSIS — U071 COVID-19: Secondary | ICD-10-CM

## 2020-08-19 LAB — COMPREHENSIVE METABOLIC PANEL
ALT: 95 U/L — ABNORMAL HIGH (ref 0–44)
AST: 77 U/L — ABNORMAL HIGH (ref 15–41)
Albumin: 4.5 g/dL (ref 3.5–5.0)
Alkaline Phosphatase: 74 U/L (ref 38–126)
Anion gap: 12 (ref 5–15)
BUN: 13 mg/dL (ref 6–20)
CO2: 22 mmol/L (ref 22–32)
Calcium: 9.5 mg/dL (ref 8.9–10.3)
Chloride: 104 mmol/L (ref 98–111)
Creatinine, Ser: 0.71 mg/dL (ref 0.44–1.00)
GFR, Estimated: >60 mL/min (ref >60)
Glucose, Bld: 163 mg/dL — ABNORMAL HIGH (ref 70–99)
Potassium: 3.4 mmol/L — ABNORMAL LOW (ref 3.5–5.1)
Sodium: 138 mmol/L (ref 135–145)
Total Bilirubin: 1.5 mg/dL — ABNORMAL HIGH (ref 0.3–1.2)
Total Protein: 8.7 g/dL — ABNORMAL HIGH (ref 6.5–8.1)

## 2020-08-19 LAB — CBC
HCT: 41.9 % (ref 36.0–46.0)
Hemoglobin: 14.7 g/dL (ref 12.0–15.0)
MCH: 29.8 pg (ref 26.0–34.0)
MCHC: 35.1 g/dL (ref 30.0–36.0)
MCV: 85 fL (ref 80.0–100.0)
Platelets: 214 10*3/uL (ref 150–400)
RBC: 4.93 MIL/uL (ref 3.87–5.11)
RDW: 11.9 % (ref 11.5–15.5)
WBC: 4 10*3/uL (ref 4.0–10.5)
nRBC: 0 % (ref 0.0–0.2)

## 2020-08-19 MED ORDER — KETOROLAC TROMETHAMINE 30 MG/ML IJ SOLN
30.0000 mg | Freq: Once | INTRAMUSCULAR | Status: AC
  Administered 2020-08-19: 30 mg via INTRAMUSCULAR
  Filled 2020-08-19: qty 1

## 2020-08-19 MED ORDER — ONDANSETRON 4 MG PO TBDP: 4.0000 mg | ORAL_TABLET | Freq: Three times a day (TID) | ORAL | 0 refills | Status: DC | PRN

## 2020-08-19 MED ORDER — ONDANSETRON 4 MG PO TBDP
4.0000 mg | ORAL_TABLET | Freq: Once | ORAL | Status: AC
  Administered 2020-08-19: 4 mg via ORAL
  Filled 2020-08-19: qty 1

## 2020-08-19 NOTE — ED Triage Notes (Signed)
Pt reports tested + for covid a week ago and yesterday started with [place] and has a HA

## 2020-08-19 NOTE — ED Provider Notes (Signed)
[facility] Emergency Department Provider Note   ____________________________________________    I have reviewed the triage vital signs and the nursing notes.   HISTORY  Chief Complaint Nausea and Emesis     HPI Christine Simmons is a 44 y.o. female who was diagnosed with COVID-19 on October 11. She presents today with complaints of nausea vomiting, severe fatigue and headaches. She reports myalgias have mainly improved. Has not taken anything for this. Family has been sick as well. She is not vaccinated. She has not received monoclonal antibodies. No abdominal pain. Occasional cough but no shortness of breath.  Past Medical History:  Diagnosis Date  . Anxiety   . Arthritis    low back and neck  . Chronic kidney disease    kidney stones and infection  . Degenerative disc disease, cervical   . Endometriosis   . Family history of adverse reaction to anesthesia    "unable to put mother to sleep"  . Frequent headaches   . GERD (gastroesophageal reflux disease)   . Lichen   . Lower back pain   . Menorrhagia   . Neck pain   . Ovarian cyst     Patient Active Problem List   Diagnosis Date Noted  . S/P cervical spinal fusion 08/03/2019  . Chronic right hip pain 06/09/2019  . Degenerative disc disease, cervical 03/31/2019  . Menorrhagia with regular cycle 03/31/2019  . Pelvic pain in female 03/31/2019  . Cervical radiculopathy 08/10/2018  . Chronic bilateral low back pain 08/10/2018  . Chronic bilateral thoracic back pain 08/10/2018  . Family history of metabolic and nutritional disorder 08/10/2018  . Fatigue 08/10/2018  . Post-operative state 07/07/2016  . Cholelithiasis 04/10/2015  . Lichen 86/57/8469    Past Surgical History:  Procedure Laterality Date  . ABDOMINAL HYSTERECTOMY    . ANKLE SURGERY Left   . CERVICAL DISC ARTHROPLASTY N/A 08/03/2019   Procedure: CERVICAL ANTERIOR Gregory ARTHROPLASTY C4-6;  Surgeon: Meade Maw, MD;   Location: [facility] ORS;  Service: Neurosurgery;  Laterality: N/A;  . CESAREAN SECTION    . CHOLECYSTECTOMY N/A 04/10/2015   Procedure: LAPAROSCOPIC CHOLECYSTECTOMY WITH INTRAOPERATIVE CHOLANGIOGRAM;  Surgeon: Florene Glen, MD;  Location: [facility] ORS;  Service: General;  Laterality: N/A;  . ESOPHAGOGASTRODUODENOSCOPY (EGD) WITH PROPOFOL N/A 12/12/2016   Procedure: ESOPHAGOGASTRODUODENOSCOPY (EGD) WITH PROPOFOL;  Surgeon: Lollie Sails, MD;  Location: [facility] ENDOSCOPY;  Service: Endoscopy;  Laterality: N/A;  . GALLBLADDER SURGERY    . LAPAROSCOPIC VAGINAL HYSTERECTOMY WITH SALPINGECTOMY Bilateral 07/07/2016   Procedure: LAPAROSCOPIC ASSISTED VAGINAL HYSTERECTOMY WITH SALPINGECTOMY;  Surgeon: Boykin Nearing, MD;  Location: [facility] ORS;  Service: Gynecology;  Laterality: Bilateral;    Prior to Admission medications   Medication Sig Start Date End Date Taking? Authorizing Provider  DULoxetine (CYMBALTA) 60 MG capsule Take 1 capsule (60 mg total) by mouth daily. 12/06/19   Crecencio Mc, MD  Elagolix Sodium (ORILISSA PO) Take 1 tablet by mouth daily.    [provider]  ibuprofen (ADVIL) 600 MG tablet Take 1 tablet (600 mg total) by mouth every 8 (eight) hours as needed. 03/09/20   Sable Feil, PA-C  ondansetron (ZOFRAN ODT) 4 MG disintegrating tablet Take 1 tablet (4 mg total) by mouth every 8 (eight) hours as needed. 08/19/20   Lavonia Drafts, MD  oxyCODONE-acetaminophen (PERCOCET) 7.5-325 MG tablet Take 1 tablet by mouth every 6 (six) hours as needed. 03/09/20   Sable Feil, PA-C     Allergies  Sulfa antibiotics  Family History  Problem Relation Age of Onset  . Hypertension Mother   . COPD Mother   . Arthritis Mother   . Gallbladder disease Maternal Grandmother   . Heart disease Maternal Grandmother   . Hyperlipidemia Maternal Grandmother   . Intellectual disability Maternal Grandmother   . Hypertension Maternal Grandmother   . Heart attack Maternal Grandmother   .  Hypertension Father   . Alcohol abuse Maternal Grandfather   . Cancer Maternal Grandfather   . COPD Maternal Grandfather   . Autoimmune disease Paternal Aunt     Social History Social History   Tobacco Use  . Smoking status: Former Smoker    Quit date: 04/09/1997    Years since quitting: 23.3  . Smokeless tobacco: Never Used  Vaping Use  . Vaping Use: Never used  Substance Use Topics  . Alcohol use: No  . Drug use: No    Review of Systems  Constitutional: Occasional chills Eyes: No visual changes.  ENT: No sore throat. Cardiovascular: As above Respiratory: As above Gastrointestinal: As above Genitourinary: Negative for dysuria. Musculoskeletal: As above Skin: Negative for rash. Neurological: Headaches but no focal deficits   ____________________________________________   PHYSICAL EXAM:  VITAL SIGNS: ED Triage Vitals  Enc Vitals Group     BP 08/19/20 0856 (!) 136/100     Pulse Rate 08/19/20 0856 91     Resp 08/19/20 0856 18     Temp 08/19/20 0856 98 F (36.7 C)     Temp Source 08/19/20 0856 Oral     SpO2 08/19/20 0856 99 %     Weight 08/19/20 0855 72.6 kg (160 lb)     Height 08/19/20 0855 1.6 m ($Remo'5\' 3"'AzIFM$ )     Head Circumference --      Peak Flow --      Pain Score 08/19/20 0855 0     Pain Loc --      Pain Edu? --      Excl. in [place]? --     Constitutional: Alert and oriented. No acute distress.   Nose: No congestion/rhinnorhea. Mouth/Throat: Mucous membranes are moist.   Neck:  Painless ROM Cardiovascular: Normal rate, regular rhythm.  Good peripheral circulation. Respiratory: Normal respiratory effort.  No retractions.    Musculoskeletal:   Warm and well perfused Neurologic:  Normal speech and language. No gross focal neurologic deficits are appreciated.  Skin:  Skin is warm, dry and intact. No rash noted. Psychiatric: Mood and affect are normal. Speech and behavior are normal.  ____________________________________________   LABS (all labs  ordered are listed, but only abnormal results are displayed)  Labs Reviewed  COMPREHENSIVE METABOLIC PANEL - Abnormal; Notable for the following components:      Result Value   Potassium 3.4 (*)    Glucose, Bld 163 (*)    Total Protein 8.7 (*)    AST 77 (*)    ALT 95 (*)    Total Bilirubin 1.5 (*)    All other components within normal limits  CBC  POC URINE PREG, ED   ____________________________________________  EKG  None ____________________________________________  RADIOLOGY  None ____________________________________________   PROCEDURES  Procedure(s) performed: No  Procedures   Critical Care performed: No ____________________________________________   INITIAL IMPRESSION / ASSESSMENT AND PLAN / ED COURSE  Pertinent labs & imaging results that were available during my care of the patient were reviewed by me and considered in my medical decision making (see chart for details).  Patient with known  COVID-19 presents with nausea vomiting, headache consistent with COVID-19 symptoms. She does have a cough but no shortness of breath, oxygenation is 100% on room air. Lab work notable for mild hypokalemia likely related from vomiting mild elevation in AST ALT, and with COVID-19 patients. Normal alk phos.  Treated with IM Toradol and ODT Zofran.  Recommend continued supportive care, she is not a candidate for monoclonal antibodies given greater than 10 days      ____________________________________________   FINAL CLINICAL IMPRESSION(S) / ED DIAGNOSES  Final diagnoses:  COVID-19        Note:  This document was prepared using Dragon voice recognition software and may include unintentional dictation errors.   Lavonia Drafts, MD 08/19/20 1114

## 2020-08-19 NOTE — ED Notes (Signed)
Pt states that she tested positive for COVID 1 week ago. Pt has been having symptoms for about 2 weeks. Pt states that about 2 days ago she started vomiting. Pt is unable to keep anything down at all. Pt also c/o not having taste or smell. Pt states she is scared to eat anything because she throws it back up.

## 2020-08-20 ENCOUNTER — Emergency Department
Admission: EM | Admit: 2020-08-20 | Discharge: 2020-08-20 | Disposition: A | Discharge status: Home / Self Care | Payer: BC Managed Care – PPO | Attending: Emergency Medicine | Admitting: Emergency Medicine

## 2020-08-20 DIAGNOSIS — R112 Nausea with vomiting, unspecified: Secondary | ICD-10-CM | POA: Diagnosis not present

## 2020-08-20 DIAGNOSIS — U071 COVID-19: Secondary | ICD-10-CM | POA: Diagnosis not present

## 2020-08-20 DIAGNOSIS — R197 Diarrhea, unspecified: Secondary | ICD-10-CM | POA: Diagnosis not present

## 2020-08-20 DIAGNOSIS — Z87891 Personal history of nicotine dependence: Secondary | ICD-10-CM | POA: Diagnosis not present

## 2020-08-20 DIAGNOSIS — R9431 Abnormal electrocardiogram [ECG] [EKG]: Secondary | ICD-10-CM | POA: Diagnosis not present

## 2020-08-20 DIAGNOSIS — N189 Chronic kidney disease, unspecified: Secondary | ICD-10-CM | POA: Insufficient documentation

## 2020-08-20 LAB — CBC WITH DIFFERENTIAL/PLATELET
Abs Immature Granulocytes: 0.02 10*3/uL (ref 0.00–0.07)
Basophils Absolute: 0 10*3/uL (ref 0.0–0.1)
Basophils Relative: 0 %
Eosinophils Absolute: 0.1 10*3/uL (ref 0.0–0.5)
Eosinophils Relative: 1 %
HCT: 42.5 % (ref 36.0–46.0)
Hemoglobin: 14.4 g/dL (ref 12.0–15.0)
Immature Granulocytes: 0 %
Lymphocytes Relative: 29 %
Lymphs Abs: 1.4 10*3/uL (ref 0.7–4.0)
MCH: 29.9 pg (ref 26.0–34.0)
MCHC: 33.9 g/dL (ref 30.0–36.0)
MCV: 88.4 fL (ref 80.0–100.0)
Monocytes Absolute: 0.4 10*3/uL (ref 0.1–1.0)
Monocytes Relative: 8 %
Neutro Abs: 3.1 10*3/uL (ref 1.7–7.7)
Neutrophils Relative %: 62 %
Platelets: 207 10*3/uL (ref 150–400)
RBC: 4.81 MIL/uL (ref 3.87–5.11)
RDW: 11.9 % (ref 11.5–15.5)
WBC: 5 10*3/uL (ref 4.0–10.5)
nRBC: 0 % (ref 0.0–0.2)

## 2020-08-20 LAB — COMPREHENSIVE METABOLIC PANEL
ALT: 86 U/L — ABNORMAL HIGH (ref 0–44)
AST: 69 U/L — ABNORMAL HIGH (ref 15–41)
Albumin: 5.3 g/dL — ABNORMAL HIGH (ref 3.5–5.0)
Alkaline Phosphatase: 75 U/L (ref 38–126)
Anion gap: 16 — ABNORMAL HIGH (ref 5–15)
BUN: 18 mg/dL (ref 6–20)
CO2: 22 mmol/L (ref 22–32)
Calcium: 9.6 mg/dL (ref 8.9–10.3)
Chloride: 101 mmol/L (ref 98–111)
Creatinine, Ser: 0.83 mg/dL (ref 0.44–1.00)
GFR, Estimated: >60 mL/min (ref >60)
Glucose, Bld: 92 mg/dL (ref 70–99)
Potassium: 3.6 mmol/L (ref 3.5–5.1)
Sodium: 139 mmol/L (ref 135–145)
Total Bilirubin: 2 mg/dL — ABNORMAL HIGH (ref 0.3–1.2)
Total Protein: 9.7 g/dL — ABNORMAL HIGH (ref 6.5–8.1)

## 2020-08-20 LAB — URINALYSIS, COMPLETE (UACMP) WITH MICROSCOPIC
Bacteria, UA: NONE SEEN
Bilirubin Urine: NEGATIVE
Glucose, UA: NEGATIVE mg/dL
Hgb urine dipstick: NEGATIVE
Ketones, ur: 80 mg/dL — AB
Leukocytes,Ua: NEGATIVE
Nitrite: NEGATIVE
Protein, ur: 30 mg/dL — AB
Specific Gravity, Urine: 1.027 (ref 1.005–1.030)
pH: 5 (ref 5.0–8.0)

## 2020-08-20 MED ORDER — PROMETHAZINE HCL 12.5 MG PO TABS
12.5000 mg | ORAL_TABLET | Freq: Four times a day (QID) | ORAL | 0 refills | Status: DC | PRN
Start: 2020-08-20 — End: 2022-03-27

## 2020-08-20 MED ORDER — PROMETHAZINE HCL 25 MG/ML IJ SOLN
12.5000 mg | Freq: Once | INTRAMUSCULAR | Status: AC
  Administered 2020-08-20: 12.5 mg via INTRAVENOUS
  Filled 2020-08-20: qty 1

## 2020-08-20 MED ORDER — LACTATED RINGERS IV BOLUS
1000.0000 mL | Freq: Once | INTRAVENOUS | Status: AC
  Administered 2020-08-20: 1000 mL via INTRAVENOUS

## 2020-08-20 NOTE — ED Provider Notes (Signed)
[facility] Emergency Department Provider Note   ____________________________________________   First MD Initiated Contact with Patient 08/20/20 1606     (approximate)  I have reviewed the triage vital signs and the nursing notes.   HISTORY  Chief Complaint Covid Positive, Weakness, and Emesis    HPI Christine Simmons is a 44 y.o. female with past medical history of GERD and endometriosis who presents to the ED complaining of nausea and vomiting.  Patient reports that she has been feeling bad for the past 2 weeks, ever since she tested positive for COVID-19 on October 11.  She states all of her respiratory symptoms have resolved and she denies any recent fevers, cough, chest pain, or shortness of breath.  She does states she has been feeling very nauseous lately and has been unable to keep down either liquids or solids for the past 2 days.  This is associated with diarrhea, but she denies any dysuria, hematuria, vaginal bleeding, or vaginal discharge.  She was seen in the ED for this yesterday, prescribed Zofran, but states this has not been helping.        Past Medical History:  Diagnosis Date   Anxiety    Arthritis    low back and neck   Chronic kidney disease    kidney stones and infection   Degenerative disc disease, cervical    Endometriosis    Family history of adverse reaction to anesthesia    "unable to put mother to sleep"   Frequent headaches    GERD (gastroesophageal reflux disease)    Lichen    Lower back pain    Menorrhagia    Neck pain    Ovarian cyst     Patient Active Problem List   Diagnosis Date Noted   S/P cervical spinal fusion 08/03/2019   Chronic right hip pain 06/09/2019   Degenerative disc disease, cervical 03/31/2019   Menorrhagia with regular cycle 03/31/2019   Pelvic pain in female 03/31/2019   Cervical radiculopathy 08/10/2018   Chronic bilateral low back pain 08/10/2018   Chronic bilateral  thoracic back pain 08/10/2018   Family history of metabolic and nutritional disorder 08/10/2018   Fatigue 08/10/2018   Post-operative state 07/07/2016   Cholelithiasis 04/10/2015   Lichen 03/23/2014    Past Surgical History:  Procedure Laterality Date   ABDOMINAL HYSTERECTOMY     ANKLE SURGERY Left    CERVICAL DISC ARTHROPLASTY N/A 08/03/2019   Procedure: CERVICAL ANTERIOR DISC ARTHROPLASTY C4-6;  Surgeon: Venetia Night, MD;  Location: [facility] ORS;  Service: Neurosurgery;  Laterality: N/A;   CESAREAN SECTION     CHOLECYSTECTOMY N/A 04/10/2015   Procedure: LAPAROSCOPIC CHOLECYSTECTOMY WITH INTRAOPERATIVE CHOLANGIOGRAM;  Surgeon: Lattie Haw, MD;  Location: [facility] ORS;  Service: General;  Laterality: N/A;   ESOPHAGOGASTRODUODENOSCOPY (EGD) WITH PROPOFOL N/A 12/12/2016   Procedure: ESOPHAGOGASTRODUODENOSCOPY (EGD) WITH PROPOFOL;  Surgeon: Christena Deem, MD;  Location: [facility] ENDOSCOPY;  Service: Endoscopy;  Laterality: N/A;   GALLBLADDER SURGERY     LAPAROSCOPIC VAGINAL HYSTERECTOMY WITH SALPINGECTOMY Bilateral 07/07/2016   Procedure: LAPAROSCOPIC ASSISTED VAGINAL HYSTERECTOMY WITH SALPINGECTOMY;  Surgeon: Suzy Bouchard, MD;  Location: [facility] ORS;  Service: Gynecology;  Laterality: Bilateral;    Prior to Admission medications   Medication Sig Start Date End Date Taking? Authorizing Provider  DULoxetine (CYMBALTA) 60 MG capsule Take 1 capsule (60 mg total) by mouth daily. 12/06/19   Sherlene Shams, MD  Elagolix Sodium (ORILISSA PO) Take 1 tablet by mouth daily.  [provider]  ibuprofen (ADVIL) 600 MG tablet Take 1 tablet (600 mg total) by mouth every 8 (eight) hours as needed. 03/09/20   Joni Reining, PA-C  ondansetron (ZOFRAN ODT) 4 MG disintegrating tablet Take 1 tablet (4 mg total) by mouth every 8 (eight) hours as needed. 08/19/20   Jene Every, MD  oxyCODONE-acetaminophen (PERCOCET) 7.5-325 MG tablet Take 1 tablet by mouth every 6 (six) hours as  needed. 03/09/20   Joni Reining, PA-C  promethazine (PHENERGAN) 12.5 MG tablet Take 1 tablet (12.5 mg total) by mouth every 6 (six) hours as needed for nausea or vomiting. 08/20/20   Chesley Noon, MD    Allergies Sulfa antibiotics  Family History  Problem Relation Age of Onset   Hypertension Mother    COPD Mother    Arthritis Mother    Gallbladder disease Maternal Grandmother    Heart disease Maternal Grandmother    Hyperlipidemia Maternal Grandmother    Intellectual disability Maternal Grandmother    Hypertension Maternal Grandmother    Heart attack Maternal Grandmother    Hypertension Father    Alcohol abuse Maternal Grandfather    Cancer Maternal Grandfather    COPD Maternal Grandfather    Autoimmune disease Paternal Aunt     Social History Social History   Tobacco Use   Smoking status: Former Smoker    Quit date: 04/09/1997    Years since quitting: 23.3   Smokeless tobacco: Never Used  Vaping Use   Vaping Use: Never used  Substance Use Topics   Alcohol use: No   Drug use: No    Review of Systems  Constitutional: No fever/chills Eyes: No visual changes. ENT: No sore throat. Cardiovascular: Denies chest pain. Respiratory: Denies shortness of breath. Gastrointestinal: No abdominal pain.  Positive for nausea, vomiting, and diarrhea.  No constipation. Genitourinary: Negative for dysuria. Musculoskeletal: Negative for back pain. Skin: Negative for rash. Neurological: Negative for headaches, focal weakness or numbness.  ____________________________________________   PHYSICAL EXAM:  VITAL SIGNS: ED Triage Vitals [08/20/20 1320]  Enc Vitals Group     BP 128/76     Pulse Rate 93     Resp 18     Temp 97.7 F (36.5 C)     Temp Source Oral     SpO2 99 %     Weight      Height      Head Circumference      Peak Flow      Pain Score      Pain Loc      Pain Edu?      Excl. in [place]?     Constitutional: Alert and oriented. Eyes:  Conjunctivae are normal. Head: Atraumatic. Nose: No congestion/rhinnorhea. Mouth/Throat: Mucous membranes are moist. Neck: Normal ROM Cardiovascular: Normal rate, regular rhythm. Grossly normal heart sounds. Respiratory: Normal respiratory effort.  No retractions. Lungs CTAB. Gastrointestinal: Soft and nontender. No distention. Genitourinary: deferred Musculoskeletal: No lower extremity tenderness nor edema. Neurologic:  Normal speech and language. No gross focal neurologic deficits are appreciated. Skin:  Skin is warm, dry and intact. No rash noted. Psychiatric: Mood and affect are normal. Speech and behavior are normal.  ____________________________________________   LABS (all labs ordered are listed, but only abnormal results are displayed)  Labs Reviewed  COMPREHENSIVE METABOLIC PANEL - Abnormal; Notable for the following components:      Result Value   Total Protein 9.7 (*)    Albumin 5.3 (*)    AST 69 (*)  ALT 86 (*)    Total Bilirubin 2.0 (*)    Anion gap 16 (*)    All other components within normal limits  URINALYSIS, COMPLETE (UACMP) WITH MICROSCOPIC - Abnormal; Notable for the following components:   Color, Urine YELLOW (*)    APPearance HAZY (*)    Ketones, ur 80 (*)    Protein, ur 30 (*)    All other components within normal limits  CBC WITH DIFFERENTIAL/PLATELET  CBC WITH DIFFERENTIAL/PLATELET   ____________________________________________  EKG  ED ECG REPORT I, Chesley Noon, the attending physician, personally viewed and interpreted this ECG.   Date: 08/20/2020  EKG Time: 18:31  Rate: 88  Rhythm: normal sinus rhythm  Axis: Normal  Intervals:none  ST&T Change: None   PROCEDURES  Procedure(s) performed (including Critical Care):  Procedures   ____________________________________________   INITIAL IMPRESSION / ASSESSMENT AND PLAN / ED COURSE       44 year old female with past medical history of GERD and endometriosis who presents to  the ED complaining of persistent nausea and vomiting with diarrhea for the past 2 days, including inability to tolerate liquids or solids.  She did test positive for COVID-19 approximately 2 weeks ago, but states all respiratory symptoms have resolved and she is maintaining O2 sats on room air.  She has no abdominal tenderness on exam and associated diarrhea would be consistent with a gastroenteritis.  We will screen EKG and labs, hydrate with IV fluids and treat symptomatically with Phenergan.  Patient reports feeling better following dose of Phenergan, has now been tolerating p.o. without difficulty.  Lab work shows mild transaminitis similar to prior but is otherwise unremarkable, electrolytes within normal limits.  UA shows no evidence of infection.  Patient is appropriate for discharge home with PCP follow-up and she will be prescribed Phenergan for home use.  She was counseled to return to the ED for new worsening symptoms, patient agrees with plan.      ____________________________________________   FINAL CLINICAL IMPRESSION(S) / ED DIAGNOSES  Final diagnoses:  Nausea vomiting and diarrhea  COVID-19     ED Discharge Orders         Ordered    promethazine (PHENERGAN) 12.5 MG tablet  Every 6 hours PRN        08/20/20 1917           Note:  This document was prepared using Dragon voice recognition software and may include unintentional dictation errors.   Chesley Noon, MD 08/20/20 Jerene Bears

## 2020-08-20 NOTE — ED Triage Notes (Signed)
Pt dx w/covid 10/11, reports incr n/v, weakness, unable to keep anything down. Seen yesterday for same, sent home w/Zofran ODT, reports no relief. VSS in triage, sats 99% on RA.

## 2020-08-30 ENCOUNTER — Telehealth: Payer: BC Managed Care – PPO | Admitting: Physician Assistant

## 2020-08-30 DIAGNOSIS — B9789 Other viral agents as the cause of diseases classified elsewhere: Secondary | ICD-10-CM | POA: Diagnosis not present

## 2020-08-30 DIAGNOSIS — J019 Acute sinusitis, unspecified: Secondary | ICD-10-CM

## 2020-08-30 MED ORDER — FLUTICASONE PROPIONATE 50 MCG/ACT NA SUSP
2.0000 | Freq: Every day | NASAL | 6 refills | Status: DC
Start: 2020-08-30 — End: 2021-05-28

## 2020-08-30 NOTE — Progress Notes (Signed)
We are sorry that you are not feeling well.  Here is how we plan to help!  Based on what you have shared with me it looks like you have sinusitis.  Sinusitis is inflammation and infection in the sinus cavities of the head.  Based on your presentation I believe you most likely have Acute Viral Sinusitis.This is an infection most likely caused by a virus. There is not specific treatment for viral sinusitis other than to help you with the symptoms until the infection runs its course.    You may use an oral decongestant such as Mucinex D or if you have glaucoma or high blood pressure use plain Mucinex. Saline nasal spray help and can safely be used as often as needed for congestion, I have prescribed: Fluticasone nasal spray two sprays in each nostril once a day  Some authorities believe that zinc sprays or the use of Echinacea may shorten the course of your symptoms.  You can also use Saline nasal rinses.   Sinus infections are not as easily transmitted as other respiratory infection, however we still recommend that you avoid close contact with loved ones, especially the very young and elderly.  Remember to wash your hands thoroughly throughout the day as this is the number one way to prevent the spread of infection!  Home Care:  Only take medications as instructed by your medical team.  Do not take these medications with alcohol.  A steam or ultrasonic humidifier can help congestion.  You can place a towel over your head and breathe in the steam from hot water coming from a faucet.  Avoid close contacts especially the very young and the elderly.  Cover your mouth when you cough or sneeze.  Always remember to wash your hands.  Get Help Right Away If:  You develop worsening fever or sinus pain.  You develop a severe head ache or visual changes.  Your symptoms persist after you have completed your treatment plan.  Make sure you  Understand these instructions.  Will watch your  condition.  Will get help right away if you are not doing well or get worse.  Your e-visit answers were reviewed by a board certified advanced clinical practitioner to complete your personal care plan.  Depending on the condition, your plan could have included both over the counter or prescription medications.  If there is a problem please reply  once you have received a response from your provider.  Your safety is important to [place].  If you have drug allergies check your prescription carefully.    You can use MyChart to ask questions about today's visit, request a non-urgent call back, or ask for a work or school excuse for 24 hours related to this e-Visit. If it has been greater than 24 hours you will need to follow up with your provider, or enter a new e-Visit to address those concerns.  You will get an e-mail in the next two days asking about your experience.  I hope that your e-visit has been valuable and will speed your recovery. Thank you for using e-visits.   Greater than 5 minutes, yet less than 10 minutes of time have been spent researching, coordinating and implementing care for this patient today.

## 2021-02-04 ENCOUNTER — Ambulatory Visit: Payer: BC Managed Care – PPO | Admitting: Legal Medicine

## 2021-02-13 DIAGNOSIS — Z1331 Encounter for screening for depression: Secondary | ICD-10-CM | POA: Diagnosis not present

## 2021-02-13 DIAGNOSIS — M5432 Sciatica, left side: Secondary | ICD-10-CM | POA: Diagnosis not present

## 2021-02-13 DIAGNOSIS — M545 Low back pain, unspecified: Secondary | ICD-10-CM | POA: Diagnosis not present

## 2021-02-13 DIAGNOSIS — F4010 Social phobia, unspecified: Secondary | ICD-10-CM | POA: Diagnosis not present

## 2021-02-14 ENCOUNTER — Other Ambulatory Visit: Payer: Self-pay | Admitting: Family Medicine

## 2021-02-14 DIAGNOSIS — Z1231 Encounter for screening mammogram for malignant neoplasm of breast: Secondary | ICD-10-CM

## 2021-04-03 DIAGNOSIS — H5203 Hypermetropia, bilateral: Secondary | ICD-10-CM | POA: Diagnosis not present

## 2021-05-15 DIAGNOSIS — Z20822 Contact with and (suspected) exposure to covid-19: Secondary | ICD-10-CM | POA: Diagnosis not present

## 2021-05-15 DIAGNOSIS — R197 Diarrhea, unspecified: Secondary | ICD-10-CM | POA: Diagnosis not present

## 2021-05-15 DIAGNOSIS — Z6825 Body mass index (BMI) 25.0-25.9, adult: Secondary | ICD-10-CM | POA: Diagnosis not present

## 2021-05-21 DIAGNOSIS — L723 Sebaceous cyst: Secondary | ICD-10-CM | POA: Diagnosis not present

## 2021-05-21 DIAGNOSIS — Z6825 Body mass index (BMI) 25.0-25.9, adult: Secondary | ICD-10-CM | POA: Diagnosis not present

## 2021-05-28 ENCOUNTER — Telehealth: Payer: BC Managed Care – PPO | Admitting: Physician Assistant

## 2021-05-28 DIAGNOSIS — M5441 Lumbago with sciatica, right side: Secondary | ICD-10-CM | POA: Diagnosis not present

## 2021-05-28 MED ORDER — NAPROXEN 500 MG PO TABS: 500.0000 mg | ORAL_TABLET | Freq: Two times a day (BID) | ORAL | 0 refills | Status: DC

## 2021-05-28 MED ORDER — BACLOFEN 10 MG PO TABS: 10.0000 mg | ORAL_TABLET | Freq: Three times a day (TID) | ORAL | 0 refills | Status: DC

## 2021-05-28 NOTE — Progress Notes (Signed)
I have spent 5 minutes in review of e-visit questionnaire, review and updating patient chart, medical decision making and response to patient.   Indy Prestwood Cody Aravind Chrismer, PA-C    

## 2021-05-28 NOTE — Progress Notes (Signed)

## 2021-07-28 ENCOUNTER — Telehealth: Payer: BC Managed Care – PPO | Admitting: Nurse Practitioner

## 2021-07-28 DIAGNOSIS — J029 Acute pharyngitis, unspecified: Secondary | ICD-10-CM | POA: Diagnosis not present

## 2021-07-28 DIAGNOSIS — R051 Acute cough: Secondary | ICD-10-CM | POA: Diagnosis not present

## 2021-07-28 MED ORDER — PSEUDOEPH-BROMPHEN-DM 30-2-10 MG/5ML PO SYRP: 5.0000 mL | ORAL_SOLUTION | Freq: Four times a day (QID) | ORAL | 0 refills | Status: AC | PRN

## 2021-07-28 NOTE — Progress Notes (Signed)
E-Visit for Sore Throat  We are sorry that you are not feeling well.  Here is how we plan to help!  Your symptoms indicate a likely viral infection (Pharyngitis).   Pharyngitis is inflammation in the back of the throat which can cause a sore throat, scratchiness and sometimes difficulty swallowing.   Pharyngitis is typically caused by a respiratory virus and will just run its course.  Please keep in mind that your symptoms could last up to 10 days.  For throat pain, we recommend over the counter oral pain relief medications such as acetaminophen or aspirin, or anti-inflammatory medications such as ibuprofen or naproxen sodium.  Topical treatments such as oral throat lozenges or sprays may be used as needed.  Avoid close contact with loved ones, especially the very young and elderly.  Remember to wash your hands thoroughly throughout the day as this is the number one way to prevent the spread of infection and wipe down door knobs and counters with disinfectant.  After careful review of your answers, I would not recommend and antibiotic for your condition.  Antibiotics should not be used to treat conditions that we suspect are caused by viruses like the virus that causes the common cold or flu. However, some people can have Strep with atypical symptoms. You may need formal testing in clinic or office to confirm if your symptoms continue or worsen.  Providers prescribe antibiotics to treat infections caused by bacteria. Antibiotics are very powerful in treating bacterial infections when they are used properly.  To maintain their effectiveness, they should be used only when necessary.  Overuse of antibiotics has resulted in the development of super bugs that are resistant to treatment!    For your cough, I am prescribing Bromfed cough syrup. Take 44mL every 6 hours as needed for cough.   Home Care: Only take medications as instructed by your medical team. Do not drink alcohol while taking these  medications. A steam or ultrasonic humidifier can help congestion.  You can place a towel over your head and breathe in the steam from hot water coming from a faucet. Avoid close contacts especially the very young and the elderly. Cover your mouth when you cough or sneeze. Always remember to wash your hands.  Get Help Right Away If: You develop worsening fever or throat pain. You develop a severe head ache or visual changes. Your symptoms persist after you have completed your treatment plan.  Make sure you Understand these instructions. Will watch your condition. Will get help right away if you are not doing well or get worse.   Thank you for choosing an e-visit.  Your e-visit answers were reviewed by a board certified advanced clinical practitioner to complete your personal care plan. Depending upon the condition, your plan could have included both over the counter or prescription medications.  Please review your pharmacy choice. Make sure the pharmacy is open so you can pick up prescription now. If there is a problem, you may contact your provider through [employer] and have the prescription routed to another pharmacy.  Your safety is important to [place]. If you have drug allergies check your prescription carefully.   For the next 24 hours you can use MyChart to ask questions about today's visit, request a non-urgent call back, or ask for a work or school excuse. You will get an email in the next two days asking about your experience. I hope that your e-visit has been valuable and will speed your recovery.  I have spent at least 5 minutes reviewing and documenting in the patient's chart.  

## 2021-08-24 ENCOUNTER — Emergency Department
Admission: EM | Admit: 2021-08-24 | Discharge: 2021-08-24 | Disposition: A | Discharge status: Home / Self Care | Payer: BC Managed Care – PPO | Attending: Emergency Medicine | Admitting: Emergency Medicine

## 2021-08-24 ENCOUNTER — Emergency Department: Payer: BC Managed Care – PPO

## 2021-08-24 ENCOUNTER — Other Ambulatory Visit: Payer: Self-pay

## 2021-08-24 ENCOUNTER — Encounter: Payer: Self-pay | Admitting: Intensive Care

## 2021-08-24 DIAGNOSIS — N189 Chronic kidney disease, unspecified: Secondary | ICD-10-CM | POA: Diagnosis not present

## 2021-08-24 DIAGNOSIS — R112 Nausea with vomiting, unspecified: Secondary | ICD-10-CM

## 2021-08-24 DIAGNOSIS — Z20822 Contact with and (suspected) exposure to covid-19: Secondary | ICD-10-CM | POA: Diagnosis not present

## 2021-08-24 DIAGNOSIS — R197 Diarrhea, unspecified: Secondary | ICD-10-CM | POA: Insufficient documentation

## 2021-08-24 DIAGNOSIS — Z87891 Personal history of nicotine dependence: Secondary | ICD-10-CM | POA: Diagnosis not present

## 2021-08-24 DIAGNOSIS — R109 Unspecified abdominal pain: Secondary | ICD-10-CM | POA: Diagnosis not present

## 2021-08-24 DIAGNOSIS — K5900 Constipation, unspecified: Secondary | ICD-10-CM | POA: Diagnosis not present

## 2021-08-24 LAB — BASIC METABOLIC PANEL
Anion gap: 9 (ref 5–15)
BUN: 16 mg/dL (ref 6–20)
CO2: 24 mmol/L (ref 22–32)
Calcium: 9.2 mg/dL (ref 8.9–10.3)
Chloride: 103 mmol/L (ref 98–111)
Creatinine, Ser: 0.82 mg/dL (ref 0.44–1.00)
GFR, Estimated: >60 mL/min (ref >60)
Glucose, Bld: 133 mg/dL — ABNORMAL HIGH (ref 70–99)
Potassium: 3.7 mmol/L (ref 3.5–5.1)
Sodium: 136 mmol/L (ref 135–145)

## 2021-08-24 LAB — CBC WITH DIFFERENTIAL/PLATELET
Abs Immature Granulocytes: 0.04 10*3/uL (ref 0.00–0.07)
Basophils Absolute: 0 10*3/uL (ref 0.0–0.1)
Basophils Relative: 0 %
Eosinophils Absolute: 0 10*3/uL (ref 0.0–0.5)
Eosinophils Relative: 0 %
HCT: 41 % (ref 36.0–46.0)
Hemoglobin: 14.4 g/dL (ref 12.0–15.0)
Immature Granulocytes: 0 %
Lymphocytes Relative: 4 %
Lymphs Abs: 0.4 10*3/uL — ABNORMAL LOW (ref 0.7–4.0)
MCH: 31.2 pg (ref 26.0–34.0)
MCHC: 35.1 g/dL (ref 30.0–36.0)
MCV: 88.9 fL (ref 80.0–100.0)
Monocytes Absolute: 0.4 10*3/uL (ref 0.1–1.0)
Monocytes Relative: 5 %
Neutro Abs: 8.1 10*3/uL — ABNORMAL HIGH (ref 1.7–7.7)
Neutrophils Relative %: 91 %
Platelets: 232 10*3/uL (ref 150–400)
RBC: 4.61 MIL/uL (ref 3.87–5.11)
RDW: 12 % (ref 11.5–15.5)
WBC: 9 10*3/uL (ref 4.0–10.5)
nRBC: 0 % (ref 0.0–0.2)

## 2021-08-24 LAB — RESP PANEL BY RT-PCR (FLU A&B, COVID) ARPGX2
Influenza A by PCR: NEGATIVE
Influenza B by PCR: NEGATIVE
SARS Coronavirus 2 by RT PCR: NEGATIVE

## 2021-08-24 LAB — LIPASE, BLOOD: Lipase: 26 U/L (ref 11–51)

## 2021-08-24 IMAGING — CR DG ABDOMEN 1V
2 series · 2 of 2 positions shown · non-contrast
Comparison: None.

CLINICAL DATA: Abdominal pain, constipation

EXAM:
ABDOMEN - 1 VIEW

[abdomen kub (1 of 2)]
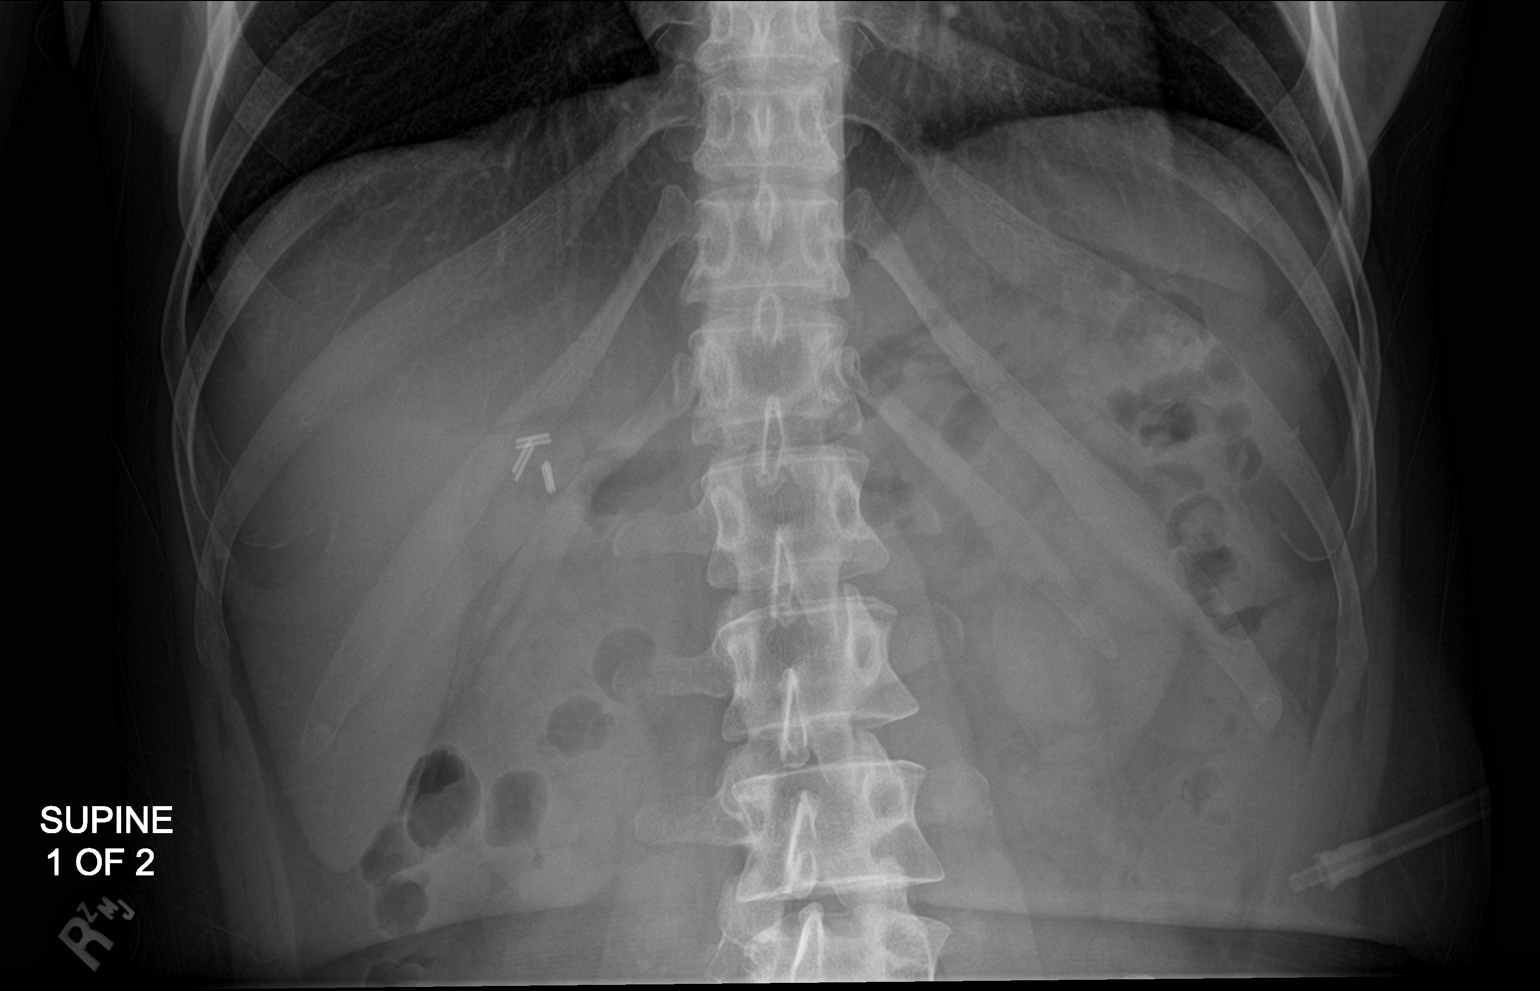

[abdomen kub (2 of 2)]
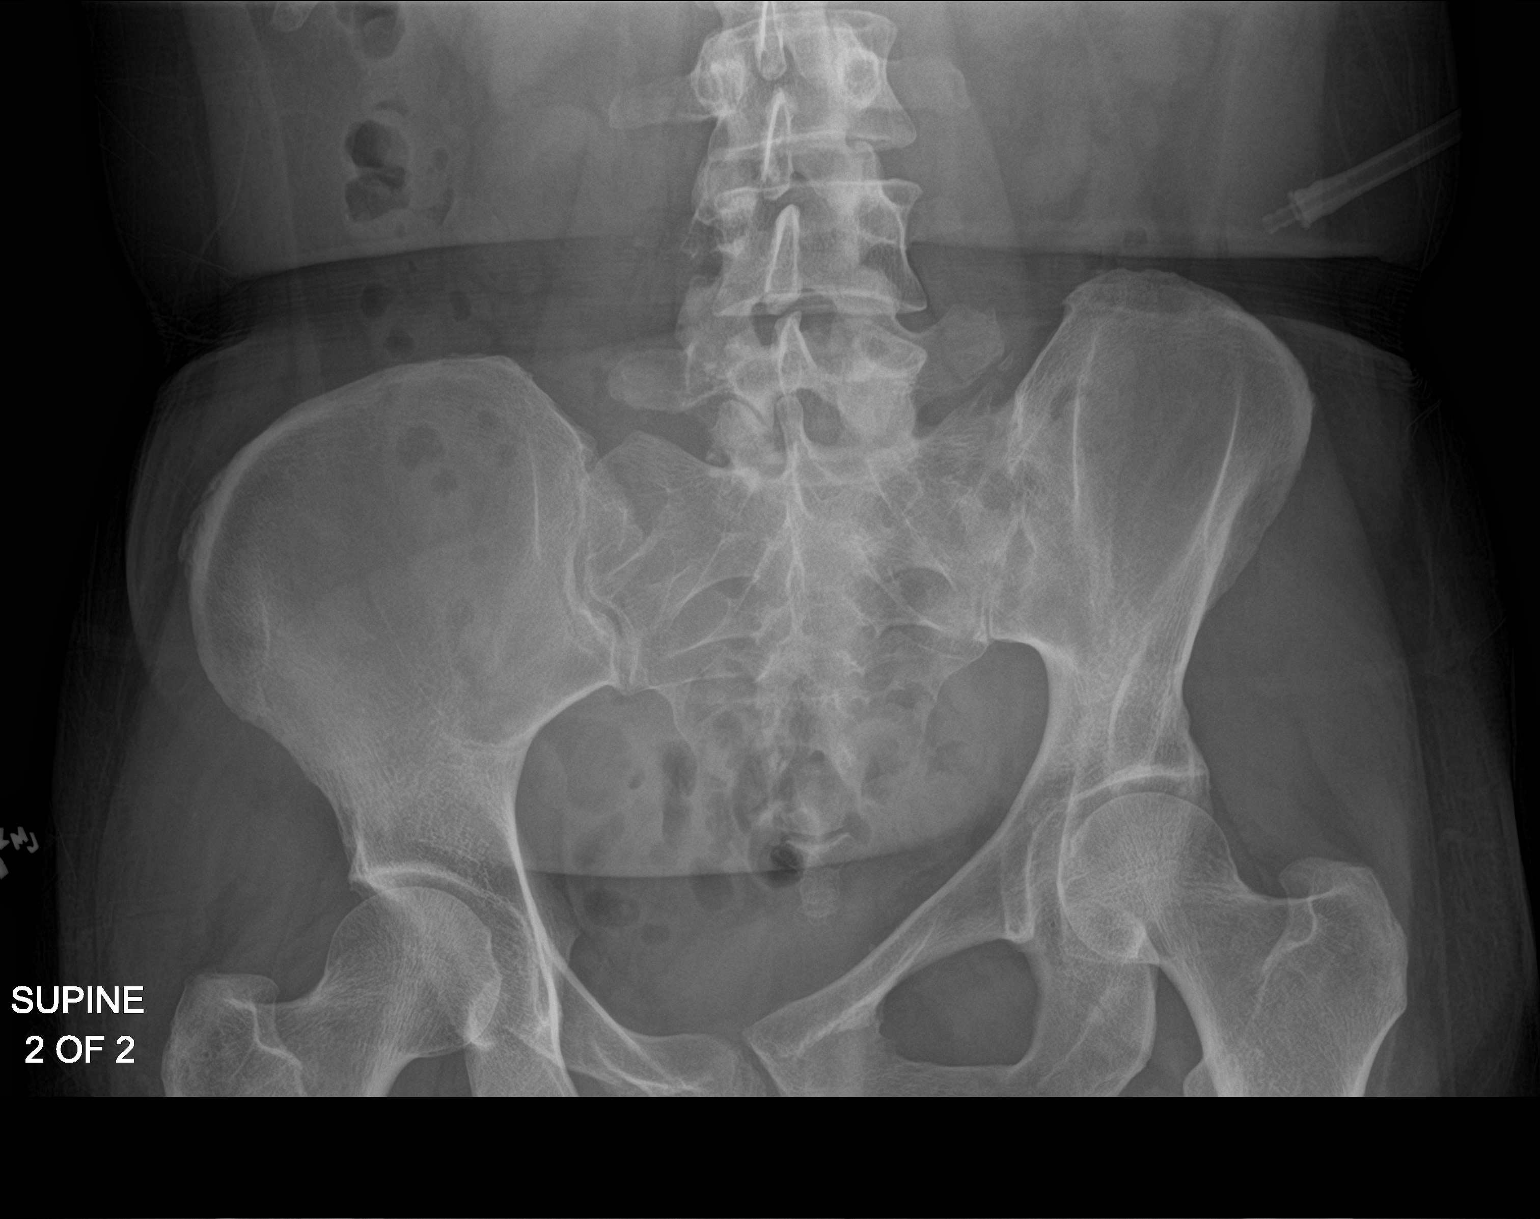

[2 of 2 positions shown; findings below may reference images not displayed]

FINDINGS: Bowel gas pattern is nonspecific. Small amount of stool is seen in
colon. There is no evidence of fecal impaction in the rectosigmoid.
No abnormal masses or calcifications are seen. Surgical clips are
seen in right upper quadrant.
IMPRESSION: Nonspecific bowel gas pattern. There is no evidence of fecal
impaction in the rectosigmoid.

Reading location: [place], [place].

## 2021-08-24 MED ORDER — ONDANSETRON 4 MG PO TBDP: 4.0000 mg | ORAL_TABLET | Freq: Three times a day (TID) | ORAL | 0 refills | Status: DC | PRN

## 2021-08-24 NOTE — ED Provider Notes (Signed)
Emergency Medicine Provider Triage Evaluation Note  GENEVIENE TESCH , a 45 y.o. female  was evaluated in triage.  Pt complains of abdominal pain, nausea, vomiting and constipation x 4 days.. Vomiting dark brown "acid". Had small amount of liquid stool last night. Not passing gas per her report.  Review of Systems  Positive: Abdominal pain, nausea, vomiting, constipation Negative: Reflux, blood in stool, urinary or vaginal complaints  Physical Exam  BP 126/86 (BP Location: Left Arm)   Pulse 94   Temp 98.1 F (36.7 C) (Oral)   Resp 16   Ht 5\' 3"  (1.6 m)   Wt 63.5 kg   LMP 06/18/2016 (Exact Date)   SpO2 99%   BMI 24.80 kg/m  Gen:   Awake, appears uncomfortable but in NAD Resp:  Normal effort  Abd:  Guarding. Active BS. Generally tender.  Medical Decision Making  Medically screening exam initiated at 7:22 AM.  Appropriate orders placed.  SHIVON HACKEL was informed that the remainder of the evaluation will be completed by another provider, this initial triage assessment does not replace that evaluation, and the importance of remaining in the ED until their evaluation is complete.  Nausea, Vomiting, Abdominal Pain, Constipation:  CBC, CMET, Lipase, KUB, urinalysis   Maryella Shivers, NP 08/24/21 08/26/21    [id number], MD 08/28/21 [phone]

## 2021-08-24 NOTE — ED Triage Notes (Signed)
Patient c/o constipation X4 days. Reports gas in abdomen with N/V.

## 2021-08-24 NOTE — ED Notes (Signed)
Patient given discharge instructions, all questions answered. Patient in possession of all belongings, directed to the discharge area  

## 2021-08-24 NOTE — Discharge Instructions (Signed)
Follow-up with your regular doctor if not improving to 3 days.  Return emergency department worsening.  Take the Zofran for nausea and vomiting as needed.  Over-the-counter Imodium A-D for diarrhea. If the COVID test is positive you will need to remain out of work for 10 days.  If negative you may return to work tomorrow  If COVID test positive you may want to follow these instructions. Take over-the-counter vitamin C, vitamin D, and zinc to help boost your immune system Take over-the-counter Mucinex for cough as needed Take over-the-counter Tylenol and ibuprofen for fever and body aches Follow-up with your regular doctor as needed Return emergency department worsening Quarantine for 5 days if you are vaccinated and 10 days if you are not.  Anne Shutter is from onset of symptoms.  At that time you may return to school/work if your symptoms have improved and you have been fever free for 24 hours.

## 2021-08-24 NOTE — ED Provider Notes (Signed)
[facility] Emergency Department Provider Note  ____________________________________________   Event Date/Time   First MD Initiated Contact with Patient 08/24/21 1130     (approximate)  I have reviewed the triage vital signs and the nursing notes.   HISTORY  Chief Complaint Constipation    HPI [name] is a 45 y.o. female complains of diarrhea.  States she has had some nausea and some vomiting.  Initially, lanes of abdominal pain and constipation triage.  Patient states she just thought she had gas buildup.  She denies any fever or chills.  States she does not have a gallbladder.  No chest pain or shortness of breath.  Past Medical History:  Diagnosis Date   Anxiety    Arthritis    low back and neck   Chronic kidney disease    kidney stones and infection   Degenerative disc disease, cervical    Endometriosis    Family history of adverse reaction to anesthesia    "unable to put mother to sleep"   Frequent headaches    GERD (gastroesophageal reflux disease)    Lichen    Lower back pain    Menorrhagia    Neck pain    Ovarian cyst     Patient Active Problem List   Diagnosis Date Noted   S/P cervical spinal fusion 08/03/2019   Chronic right hip pain 06/09/2019   Degenerative disc disease, cervical 03/31/2019   Menorrhagia with regular cycle 03/31/2019   Pelvic pain in female 03/31/2019   Cervical radiculopathy 08/10/2018   Chronic bilateral low back pain 08/10/2018   Chronic bilateral thoracic back pain 08/10/2018   Family history of metabolic and nutritional disorder 08/10/2018   Fatigue 08/10/2018   Post-operative state 07/07/2016   Cholelithiasis 04/10/2015   Lichen 03/23/2014    Past Surgical History:  Procedure Laterality Date   ABDOMINAL HYSTERECTOMY     ANKLE SURGERY Left    CERVICAL DISC ARTHROPLASTY N/A 08/03/2019   Procedure: CERVICAL ANTERIOR DISC ARTHROPLASTY C4-6;  Surgeon: Venetia Night, MD;  Location: [facility]  ORS;  Service: Neurosurgery;  Laterality: N/A;   CESAREAN SECTION     CHOLECYSTECTOMY N/A 04/10/2015   Procedure: LAPAROSCOPIC CHOLECYSTECTOMY WITH INTRAOPERATIVE CHOLANGIOGRAM;  Surgeon: Lattie Haw, MD;  Location: [facility] ORS;  Service: General;  Laterality: N/A;   ESOPHAGOGASTRODUODENOSCOPY (EGD) WITH PROPOFOL N/A 12/12/2016   Procedure: ESOPHAGOGASTRODUODENOSCOPY (EGD) WITH PROPOFOL;  Surgeon: Christena Deem, MD;  Location: [facility] ENDOSCOPY;  Service: Endoscopy;  Laterality: N/A;   GALLBLADDER SURGERY     LAPAROSCOPIC VAGINAL HYSTERECTOMY WITH SALPINGECTOMY Bilateral 07/07/2016   Procedure: LAPAROSCOPIC ASSISTED VAGINAL HYSTERECTOMY WITH SALPINGECTOMY;  Surgeon: Suzy Bouchard, MD;  Location: [facility] ORS;  Service: Gynecology;  Laterality: Bilateral;    Prior to Admission medications   Medication Sig Start Date End Date Taking? Authorizing Provider  ondansetron (ZOFRAN-ODT) 4 MG disintegrating tablet Take 1 tablet (4 mg total) by mouth every 8 (eight) hours as needed. 08/24/21  Yes Gordon Carlson, Roselyn Bering, PA-C  baclofen (LIORESAL) 10 MG tablet Take 1 tablet (10 mg total) by mouth 3 (three) times daily. 05/28/21   Waldon Merl, PA-C  DULoxetine (CYMBALTA) 60 MG capsule Take 1 capsule (60 mg total) by mouth daily. 12/06/19   Sherlene Shams, MD  naproxen (NAPROSYN) 500 MG tablet Take 1 tablet (500 mg total) by mouth 2 (two) times daily with a meal. 05/28/21   Waldon Merl, PA-C  promethazine (PHENERGAN) 12.5 MG tablet Take 1 tablet (12.5 mg  total) by mouth every 6 (six) hours as needed for nausea or vomiting. 08/20/20   Chesley Noon, MD    Allergies Sulfa antibiotics  Family History  Problem Relation Age of Onset   Hypertension Mother    COPD Mother    Arthritis Mother    Gallbladder disease Maternal Grandmother    Heart disease Maternal Grandmother    Hyperlipidemia Maternal Grandmother    Intellectual disability Maternal Grandmother    Hypertension Maternal Grandmother     Heart attack Maternal Grandmother    Hypertension Father    Alcohol abuse Maternal Grandfather    Cancer Maternal Grandfather    COPD Maternal Grandfather    Autoimmune disease Paternal Aunt     Social History Social History   Tobacco Use   Smoking status: Former    Types: Cigarettes    Quit date: 04/09/1997    Years since quitting: 24.3   Smokeless tobacco: Never  Vaping Use   Vaping Use: Every day   Substances: Nicotine, Flavoring  Substance Use Topics   Alcohol use: No   Drug use: No    Review of Systems  Constitutional: No fever/chills Eyes: No visual changes. ENT: No sore throat. Respiratory: Denies cough Cardiovascular: Denies chest pain Gastrointestinal: Denies abdominal pain, positive nausea and vomiting some diarrhea Genitourinary: Negative for dysuria. Musculoskeletal: Negative for back pain. Skin: Negative for rash. Psychiatric: no mood changes,     ____________________________________________   PHYSICAL EXAM:  VITAL SIGNS: ED Triage Vitals [08/24/21 0712]  Enc Vitals Group     BP 126/86     Pulse Rate 94     Resp 16     Temp 98.1 F (36.7 C)     Temp Source Oral     SpO2 99 %     Weight 140 lb (63.5 kg)     Height 5\' 3"  (1.6 m)     Head Circumference      Peak Flow      Pain Score 0     Pain Loc      Pain Edu?      Excl. in GC?     Constitutional: Alert and oriented. Well appearing and in no acute distress. Eyes: Conjunctivae are normal.  Head: Atraumatic. Nose: No congestion/rhinnorhea. Mouth/Throat: Mucous membranes are moist.   Neck:  supple no lymphadenopathy noted Cardiovascular: Normal rate, regular rhythm. Heart sounds are normal Respiratory: Normal respiratory effort.  No retractions, lungs c t a  Abd: soft nontender bs normal all 4 quad GU: deferred Musculoskeletal: FROM all extremities, warm and well perfused Neurologic:  Normal speech and language.  Skin:  Skin is warm, dry and intact. No rash noted. Psychiatric: Mood  and affect are normal. Speech and behavior are normal.  ____________________________________________   LABS (all labs ordered are listed, but only abnormal results are displayed)  Labs Reviewed  CBC WITH DIFFERENTIAL/PLATELET - Abnormal; Notable for the following components:      Result Value   Neutro Abs 8.1 (*)    Lymphs Abs 0.4 (*)    All other components within normal limits  BASIC METABOLIC PANEL - Abnormal; Notable for the following components:   Glucose, Bld 133 (*)    All other components within normal limits  RESP PANEL BY RT-PCR (FLU A&B, COVID) ARPGX2  LIPASE, BLOOD  URINALYSIS, ROUTINE W REFLEX MICROSCOPIC   ____________________________________________   ____________________________________________  RADIOLOGY   Abdomen 1 view ____________________________________________   PROCEDURES  Procedure(s) performed: No  Procedures    ____________________________________________  INITIAL IMPRESSION / ASSESSMENT AND PLAN / ED COURSE  Pertinent labs & imaging results that were available during my care of the patient were reviewed by me and considered in my medical decision making (see chart for details).   Patient's 45 year old female presents emergency department with nausea/vomiting.  Told triage she had constipation but told me she did not have constipation she has diarrhea.  DDx: Acute pancreatitis, cholecystitis, viral illness, COVID, malingering  Labs are reassuring, CBC, metabolic panel are normal, lipase is normal, respiratory panel is negative  Did explain the findings to the patient.  She appears to be well.  Her abdomen is nontender.  She does not have acute cholecystitis that she does not have a gallbladder, she does not have acute pancreatitis because her lipase is normal, most likely is a viral illness.  Feel that she is stable for discharge and can follow-up with her regular doctor.  Return if worsening.  Discharged stable condition.  Patient is in  agreement treatment plan.     JAIDEE STIPE was evaluated in Emergency Department on 08/24/2021 for the symptoms described in the history of present illness. She was evaluated in the context of the global COVID-19 pandemic, which necessitated consideration that the patient might be at risk for infection with the SARS-CoV-2 virus that causes COVID-19. Institutional protocols and algorithms that pertain to the evaluation of patients at risk for COVID-19 are in a state of rapid change based on information released by regulatory bodies including the CDC and federal and state organizations. These policies and algorithms were followed during the patient's care in the ED.    As part of my medical decision making, I reviewed the following data within the electronic MEDICAL RECORD NUMBER Nursing notes reviewed and incorporated, Labs reviewed, Old chart reviewed, Radiograph reviewed , Notes from prior ED visits, and [place] Controlled Substance Database  ____________________________________________   FINAL CLINICAL IMPRESSION(S) / ED DIAGNOSES  Final diagnoses:  Nausea vomiting and diarrhea  Suspected COVID-19 virus infection      NEW MEDICATIONS STARTED DURING THIS VISIT:  New Prescriptions   ONDANSETRON (ZOFRAN-ODT) 4 MG DISINTEGRATING TABLET    Take 1 tablet (4 mg total) by mouth every 8 (eight) hours as needed.     Note:  This document was prepared using Dragon voice recognition software and may include unintentional dictation errors.    Faythe Ghee, PA-C 08/24/21 1551    Merwyn Katos, MD 08/28/21 [phone]

## 2021-08-25 ENCOUNTER — Telehealth: Payer: BC Managed Care – PPO | Admitting: Emergency Medicine

## 2021-08-25 DIAGNOSIS — Z0289 Encounter for other administrative examinations: Secondary | ICD-10-CM

## 2021-08-25 DIAGNOSIS — R197 Diarrhea, unspecified: Secondary | ICD-10-CM

## 2021-08-25 DIAGNOSIS — R112 Nausea with vomiting, unspecified: Secondary | ICD-10-CM

## 2021-08-25 NOTE — Progress Notes (Signed)
I have spent 5 minutes in review of e-visit questionnaire, review and updating patient chart, medical decision making and response to patient.   Halle Davlin, PA-C    

## 2021-08-25 NOTE — Progress Notes (Signed)
We are sorry that you are not feeling well.  Here is how we plan to help!  Based on what you have shared with me it looks like you have Acute Infectious Diarrhea.  Most cases of acute diarrhea are due to infections with virus and bacteria and are self-limited conditions lasting less than 14 days.  For your symptoms you may take Imodium 2 mg tablets that are over the counter at your local pharmacy. Take two tablet now and then one after each loose stool up to 6 a day.  Antibiotics are not needed for most people with diarrhea.  Optional: Begin take the Zofran 4 mg 1 tablet every 8 hours as needed for nausea and vomiting that your were prescribed on 10/29  Unfortunately, most cases of diarrhea are self-limiting, or will resolve on its own.  Even though its uncomfortable to have diarrhea, it serves to get rid of the infection in your body.  So continue to rest and hydrate.  If you have persistent symptoms, high fever, vomiting, feeling like you are going to pass out, chest pain, and/or shortness of breath please seek in person evaluation at urgent care or the emergency room, or call 911.    HOME CARE We recommend changing your diet to help with your symptoms for the next few days. Drink plenty of fluids that contain water salt and sugar. Sports drinks such as Gatorade may help.  You may try broths, soups, bananas, applesauce, soft breads, mashed potatoes or crackers.  You are considered infectious for as long as the diarrhea continues. Hand washing or use of alcohol based hand sanitizers is recommend. It is best to stay out of work or school until your symptoms stop.   GET HELP RIGHT AWAY If you have dark yellow colored urine or do not pass urine frequently you should drink more fluids.   If your symptoms worsen  If you feel like you are going to pass out (faint) You have a new problem  MAKE SURE YOU  Understand these instructions. Will watch your condition. Will get help right away if you  are not doing well or get worse.  Thank you for choosing an e-visit.  Your e-visit answers were reviewed by a board certified advanced clinical practitioner to complete your personal care plan. Depending upon the condition, your plan could have included both over the counter or prescription medications.  Please review your pharmacy choice. Make sure the pharmacy is open so you can pick up prescription now. If there is a problem, you may contact your provider through [employer] and have the prescription routed to another pharmacy.  Your safety is important to [place]. If you have drug allergies check your prescription carefully.   For the next 24 hours you can use MyChart to ask questions about today's visit, request a non-urgent call back, or ask for a work or school excuse. You will get an email in the next two days asking about your experience. I hope that your e-visit has been valuable and will speed your recovery.

## 2021-10-08 DIAGNOSIS — S60222A Contusion of left hand, initial encounter: Secondary | ICD-10-CM | POA: Diagnosis not present

## 2021-10-08 DIAGNOSIS — M79642 Pain in left hand: Secondary | ICD-10-CM | POA: Diagnosis not present

## 2021-10-17 DIAGNOSIS — S60222A Contusion of left hand, initial encounter: Secondary | ICD-10-CM | POA: Diagnosis not present

## 2021-11-11 DIAGNOSIS — H00022 Hordeolum internum right lower eyelid: Secondary | ICD-10-CM | POA: Diagnosis not present

## 2021-12-02 ENCOUNTER — Telehealth: Payer: BC Managed Care – PPO | Admitting: Physician Assistant

## 2021-12-02 DIAGNOSIS — J069 Acute upper respiratory infection, unspecified: Secondary | ICD-10-CM

## 2021-12-02 MED ORDER — BENZONATATE 100 MG PO CAPS: 100.0000 mg | ORAL_CAPSULE | Freq: Three times a day (TID) | ORAL | 0 refills | Status: DC | PRN

## 2021-12-02 MED ORDER — FLUTICASONE PROPIONATE 50 MCG/ACT NA SUSP: 2.0000 | Freq: Every day | NASAL | 0 refills | Status: DC

## 2021-12-02 NOTE — Progress Notes (Signed)
I have spent 5 minutes in review of e-visit questionnaire, review and updating patient chart, medical decision making and response to patient.   Elvan Ebron Cody Calie Buttrey, PA-C    

## 2021-12-02 NOTE — Progress Notes (Signed)
E-Visit for Upper Respiratory Infection   We are sorry you are not feeling well.  Here is how we plan to help!  Based on what you have shared with me, it looks like you may have a viral upper respiratory infection.  Upper respiratory infections are caused by a large number of viruses; however, rhinovirus is the most common cause.   I do recommend giving low-grade fever that you test for COVID at home, just to be cautious. If this comes back positive, please let us know.   Symptoms vary from person to person, with common symptoms including sore throat, cough, fatigue or lack of energy and feeling of general discomfort.  A low-grade fever of up to 100.4 may present, but is often uncommon.  Symptoms vary however, and are closely related to a person's age or underlying illnesses.  The most common symptoms associated with an upper respiratory infection are nasal discharge or congestion, cough, sneezing, headache and pressure in the ears and face.  These symptoms usually persist for about 3 to 10 days, but can last up to 2 weeks.  It is important to know that upper respiratory infections do not cause serious illness or complications in most cases.    Upper respiratory infections can be transmitted from person to person, with the most common method of transmission being a person's hands.  The virus is able to live on the skin and can infect other persons for up to 2 hours after direct contact.  Also, these can be transmitted when someone coughs or sneezes; thus, it is important to cover the mouth to reduce this risk.  To keep the spread of the illness at [place], good hand hygiene is very important.  This is an infection that is most likely caused by a virus. There are no specific treatments other than to help you with the symptoms until the infection runs its course.  We are sorry you are not feeling well.  Here is how we plan to help!   For nasal congestion, you may use an oral decongestants such as Mucinex D  or if you have glaucoma or high blood pressure use plain Mucinex.  Saline nasal spray or nasal drops can help and can safely be used as often as needed for congestion.  For your congestion, I have prescribed Fluticasone nasal spray one spray in each nostril twice a day  If you do not have a history of heart disease, hypertension, diabetes or thyroid disease, prostate/bladder issues or glaucoma, you may also use Sudafed to treat nasal congestion.  It is highly recommended that you consult with a pharmacist or your primary care physician to ensure this medication is safe for you to take.     If you have a cough, you may use cough suppressants such as Delsym and Robitussin.  If you have glaucoma or high blood pressure, you can also use Coricidin HBP.   For cough I have prescribed for you A prescription cough medication called Tessalon Perles 100 mg. You may take 1-2 capsules every 8 hours as needed for cough  If you have a sore or scratchy throat, use a saltwater gargle-  to  teaspoon of salt dissolved in a 4-ounce to 8-ounce glass of warm water.  Gargle the solution for approximately 15-30 seconds and then spit.  It is important not to swallow the solution.  You can also use throat lozenges/cough drops and Chloraseptic spray to help with throat pain or discomfort.  Warm or cold liquids  can also be helpful in relieving throat pain.  For headache, pain or general discomfort, you can use Ibuprofen or Tylenol as directed.   Some authorities believe that zinc sprays or the use of Echinacea may shorten the course of your symptoms.   HOME CARE Only take medications as instructed by your medical team. Be sure to drink plenty of fluids. Water is fine as well as fruit juices, sodas and electrolyte beverages. You may want to stay away from caffeine or alcohol. If you are nauseated, try taking small sips of liquids. How do you know if you are getting enough fluid? Your urine should be a pale yellow or almost  colorless. Get rest. Taking a steamy shower or using a humidifier may help nasal congestion and ease sore throat pain. You can place a towel over your head and breathe in the steam from hot water coming from a faucet. Using a saline nasal spray works much the same way. Cough drops, hard candies and sore throat lozenges may ease your cough. Avoid close contacts especially the very young and the elderly Cover your mouth if you cough or sneeze Always remember to wash your hands.   GET HELP RIGHT AWAY IF: You develop worsening fever. If your symptoms do not improve within 10 days You develop yellow or green discharge from your nose over 3 days. You have coughing fits You develop a severe head ache or visual changes. You develop shortness of breath, difficulty breathing or start having chest pain Your symptoms persist after you have completed your treatment plan  MAKE SURE YOU  Understand these instructions. Will watch your condition. Will get help right away if you are not doing well or get worse.  Thank you for choosing an e-visit.  Your e-visit answers were reviewed by a board certified advanced clinical practitioner to complete your personal care plan. Depending upon the condition, your plan could have included both over the counter or prescription medications.  Please review your pharmacy choice. Make sure the pharmacy is open so you can pick up prescription now. If there is a problem, you may contact your provider through [employer] and have the prescription routed to another pharmacy.  Your safety is important to [place]. If you have drug allergies check your prescription carefully.   For the next 24 hours you can use MyChart to ask questions about today's visit, request a non-urgent call back, or ask for a work or school excuse. You will get an email in the next two days asking about your experience. I hope that your e-visit has been valuable and will speed your recovery.

## 2021-12-04 DIAGNOSIS — Z20822 Contact with and (suspected) exposure to covid-19: Secondary | ICD-10-CM | POA: Diagnosis not present

## 2021-12-04 DIAGNOSIS — B349 Viral infection, unspecified: Secondary | ICD-10-CM | POA: Diagnosis not present

## 2021-12-04 DIAGNOSIS — R070 Pain in throat: Secondary | ICD-10-CM | POA: Diagnosis not present

## 2022-03-07 ENCOUNTER — Ambulatory Visit: Payer: 59 | Admitting: Podiatry

## 2022-03-07 ENCOUNTER — Ambulatory Visit (INDEPENDENT_AMBULATORY_CARE_PROVIDER_SITE_OTHER): Payer: 59

## 2022-03-07 DIAGNOSIS — M7752 Other enthesopathy of left foot: Secondary | ICD-10-CM

## 2022-03-07 MED ORDER — BETAMETHASONE SOD PHOS & ACET 6 (3-3) MG/ML IJ SUSP
3.0000 mg | Freq: Once | INTRAMUSCULAR | Status: AC
Start: 2022-03-07 — End: 2022-03-07
  Administered 2022-03-07: 3 mg via INTRA_ARTICULAR

## 2022-03-07 MED ORDER — CELECOXIB 100 MG PO CAPS: 100.0000 mg | ORAL_CAPSULE | Freq: Two times a day (BID) | ORAL | 1 refills | Status: DC

## 2022-03-07 NOTE — Progress Notes (Signed)
? ?  HPI: 46 year old female presenting today for follow-up evaluation of hallux limitus/capsulitis to the left foot.  Patient was last seen in the office on 04/26/2019.  She says that over time the pain has increased.  She presents today for further treatment and evaluation ? ? ?Past Medical History:  ?Diagnosis Date  ? Anxiety   ? Arthritis   ? low back and neck  ? Chronic kidney disease   ? kidney stones and infection  ? Degenerative disc disease, cervical   ? Endometriosis   ? Family history of adverse reaction to anesthesia   ? "unable to put mother to sleep"  ? Frequent headaches   ? GERD (gastroesophageal reflux disease)   ? Lichen   ? Lower back pain   ? Menorrhagia   ? Neck pain   ? Ovarian cyst   ? ?  ?Physical Exam: ?General: The patient is alert and oriented x3 in no acute distress. ? ?Dermatology: Skin is warm, dry and supple bilateral lower extremities. Negative for open lesions or macerations. ? ?Vascular: Palpable pedal pulses bilaterally. No edema or erythema noted. Capillary refill within normal limits. ? ?Neurological: Epicritic and protective threshold grossly intact bilaterally.  ? ?Musculoskeletal Exam: Pain on palpation with limited range of motion noted to the first MPJ left foot with some slight crepitus ? ?Radiographic exam LT foot 03/07/2022: ?Normal osseous mineralization.  Joint spaces preserved with exception of degenerative changes and joint space narrowing and periarticular spurring to the first MTP joint left foot ? ?Assessment: ?1. DJD/Hallux limitus left  ? ?Plan of Care:  ?1. Patient evaluated.  ?2.  Injection of 0.5 cc Celestone Soluspan injected in the first MTP joint left ?3.  Patient has taken Celebrex in the past with significant improvement.  Prescription for Celebrex 100 mg 2 times daily ?4.  Patient also takes Cymbalta as prescribed from PCP.  Continue ?5.  Recommend good supportive shoes and sneakers.  Patient has to wear steel toed boots at work and she is working on  finding a good pair of shoes that help ?6.  I explained to the patient that due to the degenerative changes of the toe joint eventually I do suspect she will need to have toe joint replacement surgery.  We will try to prolong this is far as possible ? ?Works standing at [employer] ? ?  ?  ?Felecia Shelling, DPM ?[facility] ? ?Dr. Felecia Shelling, DPM  ?  ?[address]                                        ?[place], [place] [address]                ?Office [phone]  ?Fax [phone] ? ? ? ? ?

## 2022-03-27 ENCOUNTER — Other Ambulatory Visit: Payer: Self-pay

## 2022-03-27 ENCOUNTER — Emergency Department
Admission: EM | Admit: 2022-03-27 | Discharge: 2022-03-27 | Disposition: A | Discharge status: Home / Self Care | Payer: 59 | Attending: Emergency Medicine | Admitting: Emergency Medicine

## 2022-03-27 ENCOUNTER — Emergency Department: Payer: 59

## 2022-03-27 DIAGNOSIS — X509XXA Other and unspecified overexertion or strenuous movements or postures, initial encounter: Secondary | ICD-10-CM | POA: Insufficient documentation

## 2022-03-27 DIAGNOSIS — S29011A Strain of muscle and tendon of front wall of thorax, initial encounter: Secondary | ICD-10-CM | POA: Diagnosis not present

## 2022-03-27 DIAGNOSIS — S299XXA Unspecified injury of thorax, initial encounter: Secondary | ICD-10-CM | POA: Diagnosis present

## 2022-03-27 MED ORDER — ETODOLAC 400 MG PO TABS: 400.0000 mg | ORAL_TABLET | Freq: Two times a day (BID) | ORAL | 0 refills | Status: DC

## 2022-03-27 NOTE — ED Provider Notes (Signed)
[facility] Provider Note    Event Date/Time   First MD Initiated Contact with Patient 03/27/22 0740     (approximate)   History   Chest Injury   HPI  Christine Simmons is a 46 y.o. female presents to the ED with complaint of anterior chest wall pain.  Patient states that it began when she did a stretch 4 days ago and her a pop in the area of her sternum.  Patient denies any shortness of breath, nausea, indigestion or diaphoresis.  Patient states she has point tenderness to palpation.  She has been taking Tylenol and ibuprofen with minimal relief.  Patient has history of chronic back pain, degenerative disc disease, cervical radiculopathy, GERD.     Physical Exam   Triage Vital Signs: ED Triage Vitals  Enc Vitals Group     BP 03/27/22 0724 97/67     Pulse Rate 03/27/22 0724 (!) 54     Resp 03/27/22 0724 16     Temp 03/27/22 0724 98.4 F (36.9 C)     Temp Source 03/27/22 0724 Oral     SpO2 03/27/22 0724 98 %     Weight 03/27/22 0725 135 lb (61.2 kg)     Height 03/27/22 0725 5\' 3"  (1.6 m)     Head Circumference --      Peak Flow --      Pain Score 03/27/22 0725 5     Pain Loc --      Pain Edu? --      Excl. in [place]? --     Most recent vital signs: Vitals:   03/27/22 0724  BP: 97/67  Pulse: (!) 54  Resp: 16  Temp: 98.4 F (36.9 C)  SpO2: 98%     General: Awake, no distress.  CV:  Good peripheral perfusion.  Heart regular rate and rhythm. Resp:  Normal effort.  Lungs are clear bilaterally. Abd:  No distention.  Other:  No gross deformities noted on inspection of the anterior chest wall.  Patient is tender to the costochondral junction at the sternal bilaterally.  No deformity, no skin discoloration and no skin injury noted.   ED Results / Procedures / Treatments   Labs (all labs ordered are listed, but only abnormal results are displayed) Labs Reviewed - No data to display    RADIOLOGY Chest x-ray images were reviewed and  interpreted by myself without acute changes.  Radiology report is negative for acute cardiopulmonary disease.    PROCEDURES:  Critical Care performed:   Procedures   MEDICATIONS ORDERED IN ED: Medications - No data to display   IMPRESSION / MDM / ASSESSMENT AND PLAN / ED COURSE  I reviewed the triage vital signs and the nursing notes.   Differential diagnosis includes, but is not limited to, anterior chest wall pain, muscle strain, rib injury.  46 year old female presents to the ED with complaint of anterior chest wall pain after stretching 4 days ago.  Patient reports that she felt something pop in her sternum and has been having pain with movement and breathing since.  No deformity was noted and chest x-ray was reassuring.  Patient was made aware that no fractured ribs were noted and reassured.  A prescription for etodolac 400 mg twice daily with food was sent to her pharmacy.  She is encouraged to follow-up with her PCP if any continued problems or concerns.        FINAL CLINICAL IMPRESSION(S) / ED DIAGNOSES  Final diagnoses:  Chest wall muscle strain, initial encounter     Rx / DC Orders   ED Discharge Orders          Ordered    etodolac (LODINE) 400 MG tablet  2 times daily        03/27/22 0854             Note:  This document was prepared using Dragon voice recognition software and may include unintentional dictation errors.   Tommi Rumps, PA-C 03/27/22 1451    Minna Antis, MD 03/27/22 [phone]

## 2022-03-27 NOTE — ED Notes (Signed)
Pt states she was stretching one morning and felt a pop in the middle of her sternum- pt states it has been painful to touch since- pt denies any other injuries to the area- pt does state pain is worse with deep breaths

## 2022-03-27 NOTE — Discharge Instructions (Addendum)
Follow-up with your primary care provider if any continued problems.  At this time discontinue ibuprofen, naproxen and Celebrex.  Begin taking etodolac 400 mg twice daily with food.  You may use ice or heat to your chest as needed for discomfort.

## 2022-03-27 NOTE — ED Triage Notes (Signed)
Pt states she was stretching about 4 days ago and felt something pop in her sternum and has been having pain with movement and breathing since.

## 2022-04-08 ENCOUNTER — Ambulatory Visit: Payer: 59 | Admitting: Podiatry

## 2022-04-24 ENCOUNTER — Other Ambulatory Visit: Payer: Self-pay | Admitting: Internal Medicine

## 2022-04-24 DIAGNOSIS — M5412 Radiculopathy, cervical region: Secondary | ICD-10-CM

## 2022-04-24 DIAGNOSIS — G8929 Other chronic pain: Secondary | ICD-10-CM

## 2022-05-15 ENCOUNTER — Telehealth: Payer: 59 | Admitting: Physician Assistant

## 2022-05-15 DIAGNOSIS — J029 Acute pharyngitis, unspecified: Secondary | ICD-10-CM | POA: Diagnosis not present

## 2022-05-15 NOTE — Progress Notes (Signed)
?  E-Visit for Sore Throat ? ?We are sorry that you are not feeling well.  Here is how we plan to help! ? ?Your symptoms indicate a likely viral infection (Pharyngitis).   Pharyngitis is inflammation in the back of the throat which can cause a sore throat, scratchiness and sometimes difficulty swallowing.   Pharyngitis is typically caused by a respiratory virus and will just run its course.  Please keep in mind that your symptoms could last up to 10 days.  For throat pain, we recommend over the counter oral pain relief medications such as acetaminophen or aspirin, or anti-inflammatory medications such as ibuprofen or naproxen sodium.  Topical treatments such as oral throat lozenges or sprays may be used as needed.  Avoid close contact with loved ones, especially the very young and elderly.  Remember to wash your hands thoroughly throughout the day as this is the number one way to prevent the spread of infection and wipe down door knobs and counters with disinfectant. ? ?After careful review of your answers, I would not recommend an antibiotic for your condition.  Antibiotics should not be used to treat conditions that we suspect are caused by viruses like the virus that causes the common cold or flu. However, some people can have Strep with atypical symptoms. You may need formal testing in clinic or office to confirm if your symptoms continue or worsen. ? ?Providers prescribe antibiotics to treat infections caused by bacteria. Antibiotics are very powerful in treating bacterial infections when they are used properly.  To maintain their effectiveness, they should be used only when necessary.  Overuse of antibiotics has resulted in the development of super bugs that are resistant to treatment!   ? ?Home Care: ?Only take medications as instructed by your medical team. ?Do not drink alcohol while taking these medications. ?A steam or ultrasonic humidifier can help congestion.  You can place a towel over your head and  breathe in the steam from hot water coming from a faucet. ?Avoid close contacts especially the very young and the elderly. ?Cover your mouth when you cough or sneeze. ?Always remember to wash your hands. ? ?Get Help Right Away If: ?You develop worsening fever or throat pain. ?You develop a severe head ache or visual changes. ?Your symptoms persist after you have completed your treatment plan. ? ?Make sure you ?Understand these instructions. ?Will watch your condition. ?Will get help right away if you are not doing well or get worse. ? ? ?Thank you for choosing an e-visit. ? ?Your e-visit answers were reviewed by a board certified advanced clinical practitioner to complete your personal care plan. Depending upon the condition, your plan could have included both over the counter or prescription medications. ? ?Please review your pharmacy choice. Make sure the pharmacy is open so you can pick up prescription now. If there is a problem, you may contact your provider through MyChart messaging and have the prescription routed to another pharmacy.  Your safety is important to us. If you have drug allergies check your prescription carefully.  ? ?For the next 24 hours you can use MyChart to ask questions about today's visit, request a non-urgent call back, or ask for a work or school excuse. ?You will get an email in the next two days asking about your experience. I hope that your e-visit has been valuable and will speed your recovery. ? ? ?I provided 5 minutes of non face-to-face time during this encounter for chart review and documentation.  ? ?

## 2022-07-02 ENCOUNTER — Other Ambulatory Visit: Payer: Self-pay | Admitting: Family Medicine

## 2022-07-02 DIAGNOSIS — Z1231 Encounter for screening mammogram for malignant neoplasm of breast: Secondary | ICD-10-CM

## 2022-08-25 ENCOUNTER — Emergency Department: Payer: 59

## 2022-08-25 ENCOUNTER — Emergency Department
Admission: EM | Admit: 2022-08-25 | Discharge: 2022-08-25 | Disposition: A | Discharge status: Home / Self Care | Payer: 59 | Attending: Emergency Medicine | Admitting: Emergency Medicine

## 2022-08-25 ENCOUNTER — Encounter: Payer: Self-pay | Admitting: Emergency Medicine

## 2022-08-25 ENCOUNTER — Other Ambulatory Visit: Payer: Self-pay

## 2022-08-25 DIAGNOSIS — R1031 Right lower quadrant pain: Secondary | ICD-10-CM | POA: Diagnosis present

## 2022-08-25 DIAGNOSIS — K529 Noninfective gastroenteritis and colitis, unspecified: Secondary | ICD-10-CM | POA: Diagnosis not present

## 2022-08-25 DIAGNOSIS — D72819 Decreased white blood cell count, unspecified: Secondary | ICD-10-CM | POA: Insufficient documentation

## 2022-08-25 DIAGNOSIS — N189 Chronic kidney disease, unspecified: Secondary | ICD-10-CM | POA: Diagnosis not present

## 2022-08-25 LAB — CBC WITH DIFFERENTIAL/PLATELET
Abs Immature Granulocytes: 0.01 10*3/uL (ref 0.00–0.07)
Basophils Absolute: 0 10*3/uL (ref 0.0–0.1)
Basophils Relative: 1 %
Eosinophils Absolute: 0.1 10*3/uL (ref 0.0–0.5)
Eosinophils Relative: 4 %
HCT: 41.3 % (ref 36.0–46.0)
Hemoglobin: 14.3 g/dL (ref 12.0–15.0)
Immature Granulocytes: 0 %
Lymphocytes Relative: 35 %
Lymphs Abs: 1 10*3/uL (ref 0.7–4.0)
MCH: 30.1 pg (ref 26.0–34.0)
MCHC: 34.6 g/dL (ref 30.0–36.0)
MCV: 86.9 fL (ref 80.0–100.0)
Monocytes Absolute: 0.3 10*3/uL (ref 0.1–1.0)
Monocytes Relative: 10 %
Neutro Abs: 1.4 10*3/uL — ABNORMAL LOW (ref 1.7–7.7)
Neutrophils Relative %: 50 %
Platelets: 273 10*3/uL (ref 150–400)
RBC: 4.75 MIL/uL (ref 3.87–5.11)
RDW: 11.7 % (ref 11.5–15.5)
WBC: 2.9 10*3/uL — ABNORMAL LOW (ref 4.0–10.5)
nRBC: 0 % (ref 0.0–0.2)

## 2022-08-25 LAB — COMPREHENSIVE METABOLIC PANEL
ALT: 25 U/L (ref 0–44)
AST: 25 U/L (ref 15–41)
Albumin: 4.2 g/dL (ref 3.5–5.0)
Alkaline Phosphatase: 46 U/L (ref 38–126)
Anion gap: 9 (ref 5–15)
BUN: 11 mg/dL (ref 6–20)
CO2: 26 mmol/L (ref 22–32)
Calcium: 9.3 mg/dL (ref 8.9–10.3)
Chloride: 106 mmol/L (ref 98–111)
Creatinine, Ser: 0.78 mg/dL (ref 0.44–1.00)
GFR, Estimated: >60 mL/min (ref >60)
Glucose, Bld: 90 mg/dL (ref 70–99)
Potassium: 3.5 mmol/L (ref 3.5–5.1)
Sodium: 141 mmol/L (ref 135–145)
Total Bilirubin: 1.2 mg/dL (ref 0.3–1.2)
Total Protein: 7.5 g/dL (ref 6.5–8.1)

## 2022-08-25 LAB — CBC
HCT: 41.3 % (ref 36.0–46.0)
Hemoglobin: 14.4 g/dL (ref 12.0–15.0)
MCH: 30.2 pg (ref 26.0–34.0)
MCHC: 34.9 g/dL (ref 30.0–36.0)
MCV: 86.6 fL (ref 80.0–100.0)
Platelets: 250 10*3/uL (ref 150–400)
RBC: 4.77 MIL/uL (ref 3.87–5.11)
RDW: 11.7 % (ref 11.5–15.5)
WBC: 2.9 10*3/uL — ABNORMAL LOW (ref 4.0–10.5)
nRBC: 0 % (ref 0.0–0.2)

## 2022-08-25 LAB — URINALYSIS, ROUTINE W REFLEX MICROSCOPIC
Bilirubin Urine: NEGATIVE
Glucose, UA: NEGATIVE mg/dL
Hgb urine dipstick: NEGATIVE
Ketones, ur: NEGATIVE mg/dL
Leukocytes,Ua: NEGATIVE
Nitrite: NEGATIVE
Protein, ur: NEGATIVE mg/dL
Specific Gravity, Urine: 1.025 (ref 1.005–1.030)
pH: 5 (ref 5.0–8.0)

## 2022-08-25 LAB — LIPASE, BLOOD: Lipase: 35 U/L (ref 11–51)

## 2022-08-25 MED ORDER — ONDANSETRON 4 MG PO TBDP
4.0000 mg | ORAL_TABLET | Freq: Once | ORAL | Status: AC
  Administered 2022-08-25: 4 mg via ORAL
  Filled 2022-08-25: qty 1

## 2022-08-25 MED ORDER — METRONIDAZOLE 500 MG PO TABS
500.0000 mg | ORAL_TABLET | Freq: Once | ORAL | Status: AC
  Administered 2022-08-25: 500 mg via ORAL
  Filled 2022-08-25: qty 1

## 2022-08-25 MED ORDER — METRONIDAZOLE 500 MG PO TABS: 500.0000 mg | ORAL_TABLET | Freq: Three times a day (TID) | ORAL | 0 refills | Status: AC

## 2022-08-25 MED ORDER — CIPROFLOXACIN HCL 500 MG PO TABS: 500.0000 mg | ORAL_TABLET | Freq: Two times a day (BID) | ORAL | 0 refills | Status: AC

## 2022-08-25 MED ORDER — CIPROFLOXACIN HCL 500 MG PO TABS
500.0000 mg | ORAL_TABLET | Freq: Once | ORAL | Status: AC
  Administered 2022-08-25: 500 mg via ORAL
  Filled 2022-08-25: qty 1

## 2022-08-25 MED ORDER — IOHEXOL 300 MG/ML  SOLN
100.0000 mL | Freq: Once | INTRAMUSCULAR | Status: AC | PRN
  Administered 2022-08-25: 100 mL via INTRAVENOUS

## 2022-08-25 NOTE — ED Provider Triage Note (Signed)
Emergency Medicine Provider Triage Evaluation Note  Christine Simmons , a 47 y.o. female  was evaluated in triage.  Pt complains of right lower quadrant pain with nausea, vomiting, and diarrhea that started 4 days ago. History of hysterectomy and cholecystectomy. No fever and decreased appetite.  Physical Exam  LMP 06/18/2016 (Exact Date)  Gen:   Awake, no distress   Resp:  Normal effort  MSK:   Moves extremities without difficulty  Other:    Medical Decision Making  Medically screening exam initiated at 11:19 AM.  Appropriate orders placed.  Christine Simmons was informed that the remainder of the evaluation will be completed by another provider, this initial triage assessment does not replace that evaluation, and the importance of remaining in the ED until their evaluation is complete.  Abdominal pain protocol started.    Victorino Dike, FNP 08/25/22 1121

## 2022-08-25 NOTE — Discharge Instructions (Signed)
Your CAT scan showed that you have inflammation of the right side of the colon which is likely from infection.  Please take the 2 antibiotics for the next 7 days.  Please follow-up with your primary doctor and gastroenterologist.  If you develop worsening abdominal pain fevers or unable to drink please return to the emergency department.

## 2022-08-25 NOTE — ED Provider Notes (Signed)
[facility] Provider Note    Event Date/Time   First MD Initiated Contact with Patient 08/25/22 1451     (approximate)   History   Flank Pain   HPI  Christine Simmons is a 46 y.o. female with past medical history of GERD, endometriosis, chronic back pain who presents with right lower quadrant pain.  Patient notes that she has had pain in the right lower quadrant off and on for a while but since Thursday has been sick with nausea vomiting and diarrhea.  Initially was having multiple episodes of nonbloody diarrhea per day this is slowed down and she is now just having mostly gas.  Has not vomited today but does feel somewhat nauseous.  She is tolerating liquids.  Has not had fevers or chills.  Denies any urinary symptoms urgency frequency dysuria and denies any vaginal discharge.  She is sexually active.  Has had cholecystectomy    Past Medical History:  Diagnosis Date   Anxiety    Arthritis    low back and neck   Chronic kidney disease    kidney stones and infection   Degenerative disc disease, cervical    Endometriosis    Family history of adverse reaction to anesthesia    "unable to put mother to sleep"   Frequent headaches    GERD (gastroesophageal reflux disease)    Lichen    Lower back pain    Menorrhagia    Neck pain    Ovarian cyst     Patient Active Problem List   Diagnosis Date Noted   S/P cervical spinal fusion 08/03/2019   Chronic right hip pain 06/09/2019   Degenerative disc disease, cervical 03/31/2019   Menorrhagia with regular cycle 03/31/2019   Pelvic pain in female 03/31/2019   Cervical radiculopathy 08/10/2018   Chronic bilateral low back pain 08/10/2018   Chronic bilateral thoracic back pain 08/10/2018   Family history of metabolic and nutritional disorder 08/10/2018   Fatigue 08/10/2018   Post-operative state 07/07/2016   Cholelithiasis 26/83/4196   Lichen 22/29/7989     Physical Exam  Triage Vital Signs: ED Triage  Vitals  Enc Vitals Group     BP 08/25/22 1119 122/82     Pulse Rate 08/25/22 1119 77     Resp 08/25/22 1119 18     Temp 08/25/22 1119 98 F (36.7 C)     Temp Source 08/25/22 1119 Oral     SpO2 08/25/22 1119 100 %     Weight 08/25/22 1120 123 lb (55.8 kg)     Height 08/25/22 1435 5\' 3"  (1.6 m)     Head Circumference --      Peak Flow --      Pain Score 08/25/22 1120 5     Pain Loc --      Pain Edu? --      Excl. in [place]? --     Most recent vital signs: Vitals:   08/25/22 1119 08/25/22 1456  BP: 122/82 120/80  Pulse: 77 70  Resp: 18 18  Temp: 98 F (36.7 C) 98 F (36.7 C)  SpO2: 100% 100%     General: Awake, no distress.  CV:  Good peripheral perfusion.  Resp:  Normal effort.  Abd:  No distention.  Abdomen is soft but there is tenderness in the right lower quadrant no guarding Neuro:             Awake, Alert, Oriented x 3  Other:  ED Results / Procedures / Treatments  Labs (all labs ordered are listed, but only abnormal results are displayed) Labs Reviewed  CBC - Abnormal; Notable for the following components:      Result Value   WBC 2.9 (*)    All other components within normal limits  URINALYSIS, ROUTINE W REFLEX MICROSCOPIC - Abnormal; Notable for the following components:   Color, Urine AMBER (*)    APPearance HAZY (*)    All other components within normal limits  LIPASE, BLOOD  COMPREHENSIVE METABOLIC PANEL  DIFFERENTIAL     EKG     RADIOLOGY CT abdomen pelvis reviewed interpreted by myself shows inflammation of the terminal ileum   PROCEDURES: No Critical Care performed: No  Procedures    MEDICATIONS ORDERED IN ED: Medications  ondansetron (ZOFRAN-ODT) disintegrating tablet 4 mg (has no administration in time range)  ciprofloxacin (CIPRO) tablet 500 mg (has no administration in time range)  metroNIDAZOLE (FLAGYL) tablet 500 mg (has no administration in time range)  iohexol (OMNIPAQUE) 300 MG/ML solution 100 mL (100 mLs Intravenous  Contrast Given 08/25/22 1333)     IMPRESSION / MDM / ASSESSMENT AND PLAN / ED COURSE  I reviewed the triage vital signs and the nursing notes.                              Patient's presentation is most consistent with acute presentation with potential threat to life or bodily function.  Differential diagnosis includes, but is not limited to, appendicitis, enteritis, colitis, metabolic appendagitis, mesenteric adenitis, kidney stone, pyelonephritis, ovarian cyst, ovarian torsion  Patient is a 46 year old female presenting with nausea vomiting diarrhea and right lower quadrant pain.  Symptoms are going on for about 4 days.  Pain is clearly in the right lower quadrant.  Says she has had pain in this area before but not usually accompanied by nausea vomiting diarrhea.  Has not had fevers.  Vital signs are reassuring.  She is not actually having any diarrhea or vomiting today is tolerating liquids.  On exam she is tender in the right lower quadrant but there is no guarding her abdomen is soft.  Labs are notable for leukopenia with white cell count 2.9, have added on a differential.  Rest of her labs are reassuring.  UA does not indicate infection.  CT abdomen pelvis obtained to rule out appendicitis shows likely at terminal ileitis differential includes infectious versus inflammatory process.  Given nausea vomiting diarrhea I suspect this is infectious.  We will prescribe 7-day course of Cipro Flagyl.  Did recommend she follow-up with GI she might need a colonoscopy to rule out IBD after this acute illness has resolved.  Offered patient IV fluids IV antiemetics she did not want to stay in the ED much longer after I evaluated her.  She was given the first dose of antibiotics p.o. and ODT Zofran.  I also discussed with her that I would like to continue the differential white blood cell count but she ultimately did not want to stay given she is not having fevers and it is okay       FINAL CLINICAL  IMPRESSION(S) / ED DIAGNOSES   Final diagnoses:  Enteritis     Rx / DC Orders   ED Discharge Orders          Ordered    ciprofloxacin (CIPRO) 500 MG tablet  2 times daily        08/25/22  1507    metroNIDAZOLE (FLAGYL) 500 MG tablet  3 times daily        08/25/22 1507             Note:  This document was prepared using Dragon voice recognition software and may include unintentional dictation errors.   Georga Hacking, MD 08/25/22 [phone]

## 2022-08-25 NOTE — ED Triage Notes (Signed)
Pt has hand right flank and rt abd pain for the last four days with N/V. Pt sts that she has had a hyst years ago and doesn't know why she is in pain.

## 2022-08-29 ENCOUNTER — Other Ambulatory Visit: Payer: Self-pay

## 2022-08-29 ENCOUNTER — Emergency Department
Admission: EM | Admit: 2022-08-29 | Discharge: 2022-08-29 | Disposition: A | Discharge status: Home / Self Care | Payer: 59 | Attending: Emergency Medicine | Admitting: Emergency Medicine

## 2022-08-29 DIAGNOSIS — B379 Candidiasis, unspecified: Secondary | ICD-10-CM | POA: Diagnosis not present

## 2022-08-29 DIAGNOSIS — R300 Dysuria: Secondary | ICD-10-CM | POA: Diagnosis present

## 2022-08-29 LAB — URINALYSIS, ROUTINE W REFLEX MICROSCOPIC
Bilirubin Urine: NEGATIVE
Glucose, UA: NEGATIVE mg/dL
Hgb urine dipstick: NEGATIVE
Ketones, ur: 5 mg/dL — AB
Nitrite: NEGATIVE
Protein, ur: 30 mg/dL — AB
Specific Gravity, Urine: 1.027 (ref 1.005–1.030)
pH: 5 (ref 5.0–8.0)

## 2022-08-29 LAB — WET PREP, GENITAL
Clue Cells Wet Prep HPF POC: NONE SEEN
Sperm: NONE SEEN
Trich, Wet Prep: NONE SEEN
WBC, Wet Prep HPF POC: <10 — AB (ref <10)

## 2022-08-29 MED ORDER — FLUCONAZOLE 150 MG PO TABS: ORAL_TABLET | ORAL | 0 refills | Status: DC

## 2022-08-29 NOTE — ED Provider Triage Note (Signed)
Emergency Medicine Provider Triage Evaluation Note  PAISLEA HATTON , a 46 y.o. female  was evaluated in triage.  Pt complains of burning with urination..  Review of Systems  Positive:  Negative:   Physical Exam  BP 126/80 (BP Location: Left Arm)   Pulse 72   Temp 98.6 F (37 C) (Oral)   Resp 16   Ht 5\' 3"  (1.6 m)   Wt 54.4 kg   LMP 06/18/2016 (Exact Date)   SpO2 96%   BMI 21.26 kg/m  Gen:   Awake, no distress   Resp:  Normal effort  MSK:   Moves extremities without difficulty  Other:    Medical Decision Making  Medically screening exam initiated at 7:51 PM.  Appropriate orders placed.  LALANI WINKLES was informed that the remainder of the evaluation will be completed by another provider, this initial triage assessment does not replace that evaluation, and the importance of remaining in the ED until their evaluation is complete.  Patient is on antibiotics for colon infection.  States started having a lot of burning and pain with urination.   Versie Starks, PA-C 08/29/22 1951

## 2022-08-29 NOTE — ED Provider Notes (Signed)
[facility] Provider Note    Event Date/Time   First MD Initiated Contact with Patient 08/29/22 2111     (approximate)   History   Dysuria   HPI  Christine Simmons is a 46 y.o. female presents emergency department complaining of vaginal irritation and dysuria.  Patient is on Cipro and Flagyl for colitis.  States the symptoms started after taking the medication.  No noted vaginal discharge, area is not itchy is just burning.     Physical Exam   Triage Vital Signs: ED Triage Vitals  Enc Vitals Group     BP 08/29/22 1948 126/80     Pulse Rate 08/29/22 1948 72     Resp 08/29/22 1948 16     Temp 08/29/22 1948 98.6 F (37 C)     Temp Source 08/29/22 1948 Oral     SpO2 08/29/22 1948 96 %     Weight 08/29/22 1948 120 lb (54.4 kg)     Height 08/29/22 1948 5\' 3"  (1.6 m)     Head Circumference --      Peak Flow --      Pain Score 08/29/22 2010 3     Pain Loc --      Pain Edu? --      Excl. in [place]? --     Most recent vital signs: Vitals:   08/29/22 1948  BP: 126/80  Pulse: 72  Resp: 16  Temp: 98.6 F (37 C)  SpO2: 96%     General: Awake, no distress.   CV:  Good peripheral perfusion. regular rate and  rhythm Resp:  Normal effort. Lungs CTA Abd:  No distention.  Nontender Other:      ED Results / Procedures / Treatments   Labs (all labs ordered are listed, but only abnormal results are displayed) Labs Reviewed  WET PREP, GENITAL - Abnormal; Notable for the following components:      Result Value   Yeast Wet Prep HPF POC PRESENT (*)    WBC, Wet Prep HPF POC <10 (*)    All other components within normal limits  URINALYSIS, ROUTINE W REFLEX MICROSCOPIC - Abnormal; Notable for the following components:   Color, Urine AMBER (*)    APPearance CLOUDY (*)    Ketones, ur 5 (*)    Protein, ur 30 (*)    Leukocytes,Ua TRACE (*)    Bacteria, UA RARE (*)    All other components within normal limits      EKG     RADIOLOGY     PROCEDURES:   Procedures   MEDICATIONS ORDERED IN ED: Medications - No data to display   IMPRESSION / MDM / [place] / ED COURSE  I reviewed the triage vital signs and the nursing notes.                              Differential diagnosis includes, but is not limited to, UTI, yeast, bacterial vaginosis  Patient's presentation is most consistent with acute complicated illness / injury requiring diagnostic workup.   Patient's urinalysis is reassuring, wet prep shows yeast, no clue cells to indicate bacterial vaginosis  Did explain this to the patient.  She was given a prescription for Diflucan.  She is still to finish her antibiotics.  She is to take 1 Diflucan now and 1 in a week this should help prevent yeast.  She should also eat yogurt or  take a probiotic.  Patient is in agreement with treatment plan.  She is discharged stable condition.      FINAL CLINICAL IMPRESSION(S) / ED DIAGNOSES   Final diagnoses:  Yeast infection     Rx / DC Orders   ED Discharge Orders          Ordered    fluconazole (DIFLUCAN) 150 MG tablet        08/29/22 2158             Note:  This document was prepared using Dragon voice recognition software and may include unintentional dictation errors.    Versie Starks, PA-C 08/29/22 2215    Carrie Mew, MD 08/30/22 [phone]

## 2022-08-29 NOTE — ED Triage Notes (Signed)
Pt is on oral abx for a stomach infection and sts today she started having burning with urination. Pt denies concerns about any STD.

## 2022-08-29 NOTE — Discharge Instructions (Signed)
Take the medication as prescribed.  Eat yogurt and take a probiotic this will help balance the vaginal area where you will not have yeast.  Return if worsening

## 2022-09-20 ENCOUNTER — Telehealth: Payer: 59 | Admitting: Physician Assistant

## 2022-09-20 DIAGNOSIS — J069 Acute upper respiratory infection, unspecified: Secondary | ICD-10-CM | POA: Diagnosis not present

## 2022-09-20 MED ORDER — IPRATROPIUM BROMIDE 0.03 % NA SOLN
2.0000 | Freq: Two times a day (BID) | NASAL | 0 refills | Status: DC
Start: 2022-09-20 — End: 2023-07-13

## 2022-09-20 MED ORDER — BENZONATATE 100 MG PO CAPS: 100.0000 mg | ORAL_CAPSULE | Freq: Three times a day (TID) | ORAL | 0 refills | Status: DC | PRN

## 2022-09-20 NOTE — Progress Notes (Signed)
E-Visit for Upper Respiratory Infection   We are sorry you are not feeling well.  Here is how we plan to help!  Based on what you have shared with me, it looks like you may have a viral upper respiratory infection.  Upper respiratory infections are caused by a large number of viruses; however, rhinovirus is the most common cause.   Symptoms vary from person to person, with common symptoms including sore throat, cough, fatigue or lack of energy and feeling of general discomfort.  A low-grade fever of up to 100.4 may present, but is often uncommon.  Symptoms vary however, and are closely related to a person's age or underlying illnesses.  The most common symptoms associated with an upper respiratory infection are nasal discharge or congestion, cough, sneezing, headache and pressure in the ears and face.  These symptoms usually persist for about 3 to 10 days, but can last up to 2 weeks.  It is important to know that upper respiratory infections do not cause serious illness or complications in most cases.    Upper respiratory infections can be transmitted from person to person, with the most common method of transmission being a person's hands.  The virus is able to live on the skin and can infect other persons for up to 2 hours after direct contact.  Also, these can be transmitted when someone coughs or sneezes; thus, it is important to cover the mouth to reduce this risk.  To keep the spread of the illness at bay, good hand hygiene is very important.  This is an infection that is most likely caused by a virus. There are no specific treatments other than to help you with the symptoms until the infection runs its course.  We are sorry you are not feeling well.  Here is how we plan to help!   For nasal congestion, you may use an oral decongestants such as Mucinex D or if you have glaucoma or high blood pressure use plain Mucinex.  Saline nasal spray or nasal drops can help and can safely be used as often as  needed for congestion.  For your congestion, I have prescribed Ipratropium Bromide nasal spray 0.03% two sprays in each nostril 2-3 times a day  If you do not have a history of heart disease, hypertension, diabetes or thyroid disease, prostate/bladder issues or glaucoma, you may also use Sudafed to treat nasal congestion.  It is highly recommended that you consult with a pharmacist or your primary care physician to ensure this medication is safe for you to take.     If you have a cough, you may use cough suppressants such as Delsym and Robitussin.  If you have glaucoma or high blood pressure, you can also use Coricidin HBP.   For cough I have prescribed for you A prescription cough medication called Tessalon Perles 100 mg. You may take 1-2 capsules every 8 hours as needed for cough  If you have a sore or scratchy throat, use a saltwater gargle-  to  teaspoon of salt dissolved in a 4-ounce to 8-ounce glass of warm water.  Gargle the solution for approximately 15-30 seconds and then spit.  It is important not to swallow the solution.  You can also use throat lozenges/cough drops and Chloraseptic spray to help with throat pain or discomfort.  Warm or cold liquids can also be helpful in relieving throat pain.  For headache, pain or general discomfort, you can use Ibuprofen or Tylenol as directed.     Some authorities believe that zinc sprays or the use of Echinacea may shorten the course of your symptoms.   HOME CARE Only take medications as instructed by your medical team. Be sure to drink plenty of fluids. Water is fine as well as fruit juices, sodas and electrolyte beverages. You may want to stay away from caffeine or alcohol. If you are nauseated, try taking small sips of liquids. How do you know if you are getting enough fluid? Your urine should be a pale yellow or almost colorless. Get rest. Taking a steamy shower or using a humidifier may help nasal congestion and ease sore throat pain. You can  place a towel over your head and breathe in the steam from hot water coming from a faucet. Using a saline nasal spray works much the same way. Cough drops, hard candies and sore throat lozenges may ease your cough. Avoid close contacts especially the very young and the elderly Cover your mouth if you cough or sneeze Always remember to wash your hands.   GET HELP RIGHT AWAY IF: You develop worsening fever. If your symptoms do not improve within 10 days You develop yellow or green discharge from your nose over 3 days. You have coughing fits You develop a severe head ache or visual changes. You develop shortness of breath, difficulty breathing or start having chest pain Your symptoms persist after you have completed your treatment plan  MAKE SURE YOU  Understand these instructions. Will watch your condition. Will get help right away if you are not doing well or get worse.  Thank you for choosing an e-visit.  Your e-visit answers were reviewed by a board certified advanced clinical practitioner to complete your personal care plan. Depending upon the condition, your plan could have included both over the counter or prescription medications.  Please review your pharmacy choice. Make sure the pharmacy is open so you can pick up prescription now. If there is a problem, you may contact your provider through MyChart messaging and have the prescription routed to another pharmacy.  Your safety is important to us. If you have drug allergies check your prescription carefully.   For the next 24 hours you can use MyChart to ask questions about today's visit, request a non-urgent call back, or ask for a work or school excuse. You will get an email in the next two days asking about your experience. I hope that your e-visit has been valuable and will speed your recovery.  I have spent 5 minutes in review of e-visit questionnaire, review and updating patient chart, medical decision making and response to  patient.   Catcher Dehoyos M Caelen Higinbotham, PA-C  

## 2022-10-19 ENCOUNTER — Other Ambulatory Visit: Payer: Self-pay

## 2022-10-19 ENCOUNTER — Emergency Department
Admission: EM | Admit: 2022-10-19 | Discharge: 2022-10-19 | Disposition: A | Discharge status: Home / Self Care | Payer: 59 | Attending: Emergency Medicine | Admitting: Emergency Medicine

## 2022-10-19 DIAGNOSIS — K2970 Gastritis, unspecified, without bleeding: Secondary | ICD-10-CM | POA: Diagnosis not present

## 2022-10-19 DIAGNOSIS — R1010 Upper abdominal pain, unspecified: Secondary | ICD-10-CM | POA: Diagnosis present

## 2022-10-19 DIAGNOSIS — N189 Chronic kidney disease, unspecified: Secondary | ICD-10-CM | POA: Insufficient documentation

## 2022-10-19 DIAGNOSIS — R1013 Epigastric pain: Secondary | ICD-10-CM

## 2022-10-19 LAB — CBC
HCT: 44.8 % (ref 36.0–46.0)
Hemoglobin: 14.9 g/dL (ref 12.0–15.0)
MCH: 29.7 pg (ref 26.0–34.0)
MCHC: 33.3 g/dL (ref 30.0–36.0)
MCV: 89.2 fL (ref 80.0–100.0)
Platelets: 201 10*3/uL (ref 150–400)
RBC: 5.02 MIL/uL (ref 3.87–5.11)
RDW: 11.7 % (ref 11.5–15.5)
WBC: 4.9 10*3/uL (ref 4.0–10.5)
nRBC: 0 % (ref 0.0–0.2)

## 2022-10-19 LAB — URINALYSIS, ROUTINE W REFLEX MICROSCOPIC
Bacteria, UA: NONE SEEN
Bilirubin Urine: NEGATIVE
Glucose, UA: NEGATIVE mg/dL
Hgb urine dipstick: NEGATIVE
Ketones, ur: 20 mg/dL — AB
Leukocytes,Ua: NEGATIVE
Nitrite: NEGATIVE
Protein, ur: 30 mg/dL — AB
Specific Gravity, Urine: 1.024 (ref 1.005–1.030)
pH: 8 (ref 5.0–8.0)

## 2022-10-19 LAB — COMPREHENSIVE METABOLIC PANEL
ALT: 39 U/L (ref 0–44)
AST: 23 U/L (ref 15–41)
Albumin: 4.4 g/dL (ref 3.5–5.0)
Alkaline Phosphatase: 51 U/L (ref 38–126)
Anion gap: 9 (ref 5–15)
BUN: 15 mg/dL (ref 6–20)
CO2: 27 mmol/L (ref 22–32)
Calcium: 9.5 mg/dL (ref 8.9–10.3)
Chloride: 103 mmol/L (ref 98–111)
Creatinine, Ser: 0.66 mg/dL (ref 0.44–1.00)
GFR, Estimated: >60 mL/min (ref >60)
Glucose, Bld: 121 mg/dL — ABNORMAL HIGH (ref 70–99)
Potassium: 4.1 mmol/L (ref 3.5–5.1)
Sodium: 139 mmol/L (ref 135–145)
Total Bilirubin: 1.3 mg/dL — ABNORMAL HIGH (ref 0.3–1.2)
Total Protein: 7.9 g/dL (ref 6.5–8.1)

## 2022-10-19 LAB — LIPASE, BLOOD: Lipase: 30 U/L (ref 11–51)

## 2022-10-19 MED ORDER — LIDOCAINE VISCOUS HCL 2 % MT SOLN
15.0000 mL | Freq: Once | OROMUCOSAL | Status: AC
  Administered 2022-10-19: 15 mL via ORAL
  Filled 2022-10-19: qty 15

## 2022-10-19 MED ORDER — ONDANSETRON 4 MG PO TBDP: 4.0000 mg | ORAL_TABLET | Freq: Three times a day (TID) | ORAL | 0 refills | Status: DC | PRN

## 2022-10-19 MED ORDER — ALUM & MAG HYDROXIDE-SIMETH 200-200-20 MG/5ML PO SUSP
30.0000 mL | Freq: Once | ORAL | Status: AC
  Administered 2022-10-19: 30 mL via ORAL
  Filled 2022-10-19: qty 30

## 2022-10-19 MED ORDER — ONDANSETRON 4 MG PO TBDP
4.0000 mg | ORAL_TABLET | Freq: Once | ORAL | Status: AC
  Administered 2022-10-19: 4 mg via ORAL
  Filled 2022-10-19: qty 1

## 2022-10-19 MED ORDER — SUCRALFATE 1 G PO TABS: 1.0000 g | ORAL_TABLET | Freq: Four times a day (QID) | ORAL | 0 refills | Status: DC

## 2022-10-19 NOTE — ED Triage Notes (Signed)
Pt states she is having upper abd pain and nausea- pt had the flu about a week ago- pt states that she was seen here a month ago for the upper abd pain and was diagnosed with 2 hernias- pt states she feels like she has a lot of pressure- pt states she is also having dizzy spells

## 2022-10-19 NOTE — ED Provider Notes (Signed)
[facility] Provider Note    Event Date/Time   First MD Initiated Contact with Patient 10/19/22 1808     (approximate)  History   Chief Complaint: Abdominal Pain  HPI  Christine Simmons is a 46 y.o. female with a past medical arthritis, CKD, gastric reflux, endometriosis, presents to the emergency department for upper abdominal discomfort.  According to the patient she has a history of chronic gastritis, takes Protonix each day but states over the past week or so she has been experiencing more upper abdominal discomfort belching and reflux.  Patient states she had drinking quite a bit of orange juice earlier today which seemed to make the pain worse leading to nausea and vomiting this morning but that is since resolved.  Continues to state a mild burning sensation to the epigastrium.  Denies any diarrhea, cough, fever, urinary symptoms.  Physical Exam   Triage Vital Signs: ED Triage Vitals  Enc Vitals Group     BP 10/19/22 1450 106/62     Pulse Rate 10/19/22 1450 72     Resp 10/19/22 1450 20     Temp 10/19/22 1450 97.6 F (36.4 C)     Temp Source 10/19/22 1450 Oral     SpO2 10/19/22 1450 99 %     Weight 10/19/22 1448 123 lb (55.8 kg)     Height 10/19/22 1448 5\' 3"  (1.6 m)     Head Circumference --      Peak Flow --      Pain Score 10/19/22 1448 5     Pain Loc --      Pain Edu? --      Excl. in [place]? --     Most recent vital signs: Vitals:   10/19/22 1450  BP: 106/62  Pulse: 72  Resp: 20  Temp: 97.6 F (36.4 C)  SpO2: 99%    General: Awake, no distress.  CV:  Good peripheral perfusion.  Regular rate and rhythm  Resp:  Normal effort.  Equal breath sounds bilaterally.  Abd:  No distention.  Soft, slight epigastric tenderness otherwise benign abdomen.  No rebound or guarding   ED Results / Procedures / Treatments   EKG  EKG viewed and interpreted by myself shows normal sinus rhythm at 65 bpm with a narrow QRS, normal axis, normal intervals,  no concerning ST changes.  No elevation.  MEDICATIONS ORDERED IN ED: Medications  alum & mag hydroxide-simeth (MAALOX/MYLANTA) 200-200-20 MG/5ML suspension 30 mL (30 mLs Oral Given 10/19/22 1830)    And  lidocaine (XYLOCAINE) 2 % viscous mouth solution 15 mL (15 mLs Oral Given 10/19/22 1830)  ondansetron (ZOFRAN-ODT) disintegrating tablet 4 mg (4 mg Oral Given 10/19/22 1830)     IMPRESSION / MDM / ASSESSMENT AND PLAN / ED COURSE  I reviewed the triage vital signs and the nursing notes.  Patient's presentation is most consistent with acute presentation with potential threat to life or bodily function.  Patient presents to the emergency department for 1 week of epigastric discomfort consistent with a past episodes of gastritis per patient.  Patient continues to take her daily Protonix but states the pain has come back and has worsened so she came to the emergency department for evaluation.  Here the patient appears well, reassuring physical exam which is slight epigastric tenderness reassuring workup including a normal CBC with normal white blood cell count reassuring CMP with normal LFTs and a normal lipase.  Patient states she is status post cholecystectomy.  Urinalysis does show slight amount of ketones otherwise normal.  Patient received Zofran and a GI cocktail in the emergency department.  States complete resolution of her symptoms and she is feeling much better.  We will discharge with sucralfate as well as Zofran.  Patient has a GI doctor states she will call to make an appointment.  FINAL CLINICAL IMPRESSION(S) / ED DIAGNOSES   Epigastric pain Gastritis    Note:  This document was prepared using Dragon voice recognition software and may include unintentional dictation errors.   Minna Antis, MD 10/19/22 1901

## 2022-10-19 NOTE — Discharge Instructions (Signed)
Please take your medications as we discussed.  Please call your GI doctor to arrange a follow-up appointment soon as possible.  Return to the emergency department for any worsening pain, nausea vomiting unable to keep down fluids, or any other symptom personally concerning to yourself.

## 2023-01-27 ENCOUNTER — Emergency Department
Admission: EM | Admit: 2023-01-27 | Discharge: 2023-01-27 | Disposition: A | Discharge status: Home / Self Care | Payer: 59 | Attending: Emergency Medicine | Admitting: Emergency Medicine

## 2023-01-27 ENCOUNTER — Emergency Department: Payer: 59

## 2023-01-27 ENCOUNTER — Other Ambulatory Visit: Payer: Self-pay

## 2023-01-27 DIAGNOSIS — J209 Acute bronchitis, unspecified: Secondary | ICD-10-CM | POA: Diagnosis not present

## 2023-01-27 DIAGNOSIS — R059 Cough, unspecified: Secondary | ICD-10-CM | POA: Diagnosis present

## 2023-01-27 DIAGNOSIS — J40 Bronchitis, not specified as acute or chronic: Secondary | ICD-10-CM

## 2023-01-27 MED ORDER — GUAIFENESIN-CODEINE 100-10 MG/5ML PO SOLN: 5.0000 mL | Freq: Four times a day (QID) | ORAL | 0 refills | Status: DC | PRN

## 2023-01-27 MED ORDER — AZITHROMYCIN 250 MG PO TABS: ORAL_TABLET | ORAL | 0 refills | Status: AC

## 2023-01-27 MED ORDER — PREDNISONE 20 MG PO TABS: 40.0000 mg | ORAL_TABLET | Freq: Every day | ORAL | 0 refills | Status: AC

## 2023-01-27 NOTE — ED Triage Notes (Signed)
Cough, congestion, body aches x 1.5 wks. Reports onset of L back pain today. Denies n/v. Reports diarrhea now resolved. Reports h/a. Denies dizziness or vision change. Pt alert and oriented following commands. Breathing unlabored speaking in full sentences. Subjective fever at home last week .

## 2023-01-27 NOTE — ED Provider Notes (Signed)
   [facility] Provider Note    Event Date/Time   First MD Initiated Contact with Patient 01/27/23 2157     (approximate)  History   Chief Complaint: Cough and Back Pain  HPI  Christine Simmons is a 47 y.o. female with a past medical history of anxiety, gastric reflux, presents emergency department for 9 days of cough and sputum production.  According to the patient for the past 9 days she has been coughing now with some sputum production.  States slight discomfort in the center of her chest when she coughs.  Denies any fever now but states a week ago she was having low-grade fever.  Denies any shortness of breath.  No pleuritic pain.  Physical Exam   Triage Vital Signs: ED Triage Vitals  Enc Vitals Group     BP 01/27/23 2049 (!) 109/90     Pulse Rate 01/27/23 2049 66     Resp 01/27/23 2049 18     Temp 01/27/23 2049 98.1 F (36.7 C)     Temp Source 01/27/23 2049 Oral     SpO2 01/27/23 2049 99 %     Weight 01/27/23 2050 117 lb (53.1 kg)     Height 01/27/23 2050 5\' 3"  (1.6 m)     Head Circumference --      Peak Flow --      Pain Score 01/27/23 2058 5     Pain Loc --      Pain Edu? --      Excl. in [place]? --     Most recent vital signs: Vitals:   01/27/23 2049  BP: (!) 109/90  Pulse: 66  Resp: 18  Temp: 98.1 F (36.7 C)  SpO2: 99%    General: Awake, no distress.  CV:  Good peripheral perfusion.  Regular rate and rhythm  Resp:  Normal effort.  Equal breath sounds bilaterally.  Abd:  No distention.  Soft, nontender.  No rebound or guarding.  ED Results / Procedures / Treatments   RADIOLOGY  Chest x-ray viewed and interpreted by myself shows no consolidation. X-ray read as negative by radiology   MEDICATIONS ORDERED IN ED: Medications - No data to display   IMPRESSION / MDM / [place] / ED COURSE  I reviewed the triage vital signs and the nursing notes.  Patient's presentation is most consistent with acute presentation with  potential threat to life or bodily function.  Patient presents emergency department for cough and sputum production.  Patient states symptoms have been ongoing for 9 days.  Patient states she was trying to wait it out to see if it will go away on its own but it continues so the patient came to the emergency department.  Patient's symptoms are very suggestive of bronchitis.  Patient's chest x-ray reassuringly is clear, vital signs reassuring, afebrile.  Reassuring physical exam.  COVID/flu swab has been sent as well given the patient's cough however patient states she is comfortable following this up on MyChart.  We will place the patient on antibiotics, 5 days of prednisone as well as cough medication to be used if needed.  Patient agreeable to plan of care.  FINAL CLINICAL IMPRESSION(S) / ED DIAGNOSES   Acute bronchitis  Rx / DC Orders   Codeine/guaifenesin Zithromax Prednisone  Note:  This document was prepared using Dragon voice recognition software and may include unintentional dictation errors.   Harvest Dark, MD 01/27/23 2205

## 2023-02-06 ENCOUNTER — Emergency Department
Admission: EM | Admit: 2023-02-06 | Discharge: 2023-02-06 | Disposition: A | Discharge status: Home / Self Care | Payer: 59 | Attending: Emergency Medicine | Admitting: Emergency Medicine

## 2023-02-06 DIAGNOSIS — K2900 Acute gastritis without bleeding: Secondary | ICD-10-CM | POA: Diagnosis not present

## 2023-02-06 DIAGNOSIS — T40495A Adverse effect of other synthetic narcotics, initial encounter: Secondary | ICD-10-CM | POA: Diagnosis not present

## 2023-02-06 DIAGNOSIS — R112 Nausea with vomiting, unspecified: Secondary | ICD-10-CM

## 2023-02-06 DIAGNOSIS — R1013 Epigastric pain: Secondary | ICD-10-CM | POA: Diagnosis present

## 2023-02-06 DIAGNOSIS — T887XXA Unspecified adverse effect of drug or medicament, initial encounter: Secondary | ICD-10-CM

## 2023-02-06 LAB — COMPREHENSIVE METABOLIC PANEL
ALT: 30 U/L (ref 0–44)
AST: 30 U/L (ref 15–41)
Albumin: 4.5 g/dL (ref 3.5–5.0)
Alkaline Phosphatase: 63 U/L (ref 38–126)
Anion gap: 12 (ref 5–15)
BUN: 14 mg/dL (ref 6–20)
CO2: 24 mmol/L (ref 22–32)
Calcium: 9.3 mg/dL (ref 8.9–10.3)
Chloride: 100 mmol/L (ref 98–111)
Creatinine, Ser: 0.62 mg/dL (ref 0.44–1.00)
GFR, Estimated: >60 mL/min (ref >60)
Glucose, Bld: 114 mg/dL — ABNORMAL HIGH (ref 70–99)
Potassium: 4.4 mmol/L (ref 3.5–5.1)
Sodium: 136 mmol/L (ref 135–145)
Total Bilirubin: 1.7 mg/dL — ABNORMAL HIGH (ref 0.3–1.2)
Total Protein: 7.9 g/dL (ref 6.5–8.1)

## 2023-02-06 LAB — CBC
HCT: 43.5 % (ref 36.0–46.0)
Hemoglobin: 14.3 g/dL (ref 12.0–15.0)
MCH: 30.2 pg (ref 26.0–34.0)
MCHC: 32.9 g/dL (ref 30.0–36.0)
MCV: 92 fL (ref 80.0–100.0)
Platelets: 254 10*3/uL (ref 150–400)
RBC: 4.73 MIL/uL (ref 3.87–5.11)
RDW: 11.6 % (ref 11.5–15.5)
WBC: 8 10*3/uL (ref 4.0–10.5)
nRBC: 0 % (ref 0.0–0.2)

## 2023-02-06 LAB — URINALYSIS, ROUTINE W REFLEX MICROSCOPIC
Bilirubin Urine: NEGATIVE
Glucose, UA: NEGATIVE mg/dL
Hgb urine dipstick: NEGATIVE
Ketones, ur: 20 mg/dL — AB
Leukocytes,Ua: NEGATIVE
Nitrite: NEGATIVE
Protein, ur: NEGATIVE mg/dL
Specific Gravity, Urine: 1.019 (ref 1.005–1.030)
pH: 7 (ref 5.0–8.0)

## 2023-02-06 LAB — LIPASE, BLOOD: Lipase: 26 U/L (ref 11–51)

## 2023-02-06 MED ORDER — ONDANSETRON HCL 4 MG/2ML IJ SOLN
4.0000 mg | Freq: Once | INTRAMUSCULAR | Status: AC
  Administered 2023-02-06: 4 mg via INTRAVENOUS
  Filled 2023-02-06: qty 2

## 2023-02-06 MED ORDER — SODIUM CHLORIDE 0.9 % IV BOLUS
1000.0000 mL | Freq: Once | INTRAVENOUS | Status: AC
  Administered 2023-02-06: 1000 mL via INTRAVENOUS

## 2023-02-06 MED ORDER — FAMOTIDINE 20 MG PO TABS
20.0000 mg | ORAL_TABLET | Freq: Once | ORAL | Status: AC
  Administered 2023-02-06: 20 mg via ORAL
  Filled 2023-02-06: qty 1

## 2023-02-06 MED ORDER — ALUM & MAG HYDROXIDE-SIMETH 200-200-20 MG/5ML PO SUSP
30.0000 mL | Freq: Once | ORAL | Status: AC
  Administered 2023-02-06: 30 mL via ORAL
  Filled 2023-02-06: qty 30

## 2023-02-06 MED ORDER — ONDANSETRON HCL 4 MG PO TABS: 4.0000 mg | ORAL_TABLET | Freq: Three times a day (TID) | ORAL | 0 refills | Status: DC | PRN

## 2023-02-06 NOTE — ED Provider Notes (Signed)
[facility] Provider Note    Event Date/Time   First MD Initiated Contact with Patient 02/06/23 1520     (approximate)   History   Emesis   HPI  Christine Simmons is a 47 y.o. female with a past medical history of cholecystectomy, gastritis/peptic ulcer disease who presents today for evaluation of epigastric pain and vomiting.  Patient reports that she had 8 teeth pulled yesterday with her dentist and she was prescribed tramadol.  Patient reports that she knows that she cannot take tramadol because it makes her developed nausea, vomiting, and epigastric pain, however she reports that she took it anyways.  She reports that she regrets this decision because she has burning epigastric pain nausea, and vomiting.  She denies any pain with eating.  She reports that her pain in her mouth is better today.  She reports that she used to take Protonix which would help her gastritis, however she stopped taking this.  She thinks that this needs to be resumed today.  No fevers or chills.  Patient Active Problem List   Diagnosis Date Noted   S/P cervical spinal fusion 08/03/2019   Chronic right hip pain 06/09/2019   Degenerative disc disease, cervical 03/31/2019   Menorrhagia with regular cycle 03/31/2019   Pelvic pain in female 03/31/2019   Cervical radiculopathy 08/10/2018   Chronic bilateral low back pain 08/10/2018   Chronic bilateral thoracic back pain 08/10/2018   Family history of metabolic and nutritional disorder 08/10/2018   Fatigue 08/10/2018   Post-operative state 07/07/2016   Cholelithiasis 04/10/2015   Lichen 03/23/2014          Physical Exam   Triage Vital Signs: ED Triage Vitals  Enc Vitals Group     BP 02/06/23 1406 118/80     Pulse Rate 02/06/23 1406 69     Resp 02/06/23 1406 18     Temp 02/06/23 1406 98.7 F (37.1 C)     Temp Source 02/06/23 1406 Oral     SpO2 02/06/23 1406 97 %     Weight 02/06/23 1408 120 lb (54.4 kg)     Height --       Head Circumference --      Peak Flow --      Pain Score --      Pain Loc --      Pain Edu? --      Excl. in GC? --     Most recent vital signs: Vitals:   02/06/23 1406  BP: 118/80  Pulse: 69  Resp: 18  Temp: 98.7 F (37.1 C)  SpO2: 97%    Physical Exam Vitals and nursing note reviewed.  Constitutional:      General: Awake and alert. No acute distress.    Appearance: Normal appearance. The patient is normal weight.  HENT:     Head: Normocephalic and atraumatic.     Mouth: Mucous membranes are moist.  Eyes:     General: PERRL. Normal EOMs        Right eye: No discharge.        Left eye: No discharge.     Conjunctiva/sclera: Conjunctivae normal.  Cardiovascular:     Rate and Rhythm: Normal rate and regular rhythm.     Pulses: Normal pulses.  Pulmonary:     Effort: Pulmonary effort is normal. No respiratory distress.     Breath sounds: Normal breath sounds.  Abdominal:     Abdomen is soft. There is no abdominal  tenderness. No rebound or guarding. No distention.  Negative Murphy sign Musculoskeletal:        General: No swelling. Normal range of motion.     Cervical back: Normal range of motion and neck supple.  Skin:    General: Skin is warm and dry.     Capillary Refill: Capillary refill takes less than 2 seconds.     Findings: No rash.  Neurological:     Mental Status: The patient is awake and alert.      ED Results / Procedures / Treatments   Labs (all labs ordered are listed, but only abnormal results are displayed) Labs Reviewed  COMPREHENSIVE METABOLIC PANEL - Abnormal; Notable for the following components:      Result Value   Glucose, Bld 114 (*)    Total Bilirubin 1.7 (*)    All other components within normal limits  URINALYSIS, ROUTINE W REFLEX MICROSCOPIC - Abnormal; Notable for the following components:   Color, Urine YELLOW (*)    APPearance CLOUDY (*)    Ketones, ur 20 (*)    All other components within normal limits  LIPASE, BLOOD  CBC      EKG     RADIOLOGY     PROCEDURES:  Critical Care performed:   Procedures   MEDICATIONS ORDERED IN ED: Medications  famotidine (PEPCID) tablet 20 mg (20 mg Oral Given 02/06/23 1619)  alum & mag hydroxide-simeth (MAALOX/MYLANTA) 200-200-20 MG/5ML suspension 30 mL (30 mLs Oral Given 02/06/23 1619)  sodium chloride 0.9 % bolus 1,000 mL (0 mLs Intravenous Stopped 02/06/23 1750)  ondansetron (ZOFRAN) injection 4 mg (4 mg Intravenous Given 02/06/23 1619)     IMPRESSION / MDM / ASSESSMENT AND PLAN / ED COURSE  I reviewed the triage vital signs and the nursing notes.   Differential diagnosis includes, but is not limited to, medication reaction, pancreatitis, gastritis, peptic ulcer disease.  Patient has a history of cholecystitis.  I reviewed the patient's chart.  Patient has been seen for epigastric pain and gastritis in the past, most recently on 10/19/2022.  Patient is awake and alert, hemodynamically stable and afebrile.  She was placed on the cardiac monitor.  Labs obtained in triage are overall reassuring.  She has no reproducible abdominal tenderness.  She is quite certain that her symptoms are consistent with her previous gastritis, and worsened by the tramadol that she was prescribed that she knows that she has this reaction with.  Her mouth appears to be healing well, no evidence of abscess.  She has a follow-up appoint with her dentist already scheduled.  Patient was treated symptomatically with complete resolution of her symptoms.  We discussed strict return precautions and the importance of close outpatient follow-up.  She requested a prescription for Zofran which was provided.  Patient was discharged in stable condition.   Patient's presentation is most consistent with acute presentation with potential threat to life or bodily function.   FINAL CLINICAL IMPRESSION(S) / ED DIAGNOSES   Final diagnoses:  Acute gastritis without hemorrhage, unspecified gastritis type   Nausea and vomiting, unspecified vomiting type  Medication side effect     Rx / DC Orders   ED Discharge Orders          Ordered    ondansetron (ZOFRAN) 4 MG tablet  Every 8 hours PRN        02/06/23 1713             Note:  This document was prepared using Dragon voice recognition  software and may include unintentional dictation errors.   Keturah Shavers 02/06/23 2038    Pilar Jarvis, MD 02/08/23 [phone]

## 2023-02-06 NOTE — ED Triage Notes (Signed)
Pt sts that yesterday she had eight teeth pulled and was given tramadol for it. Since than pt sts that she has been having abd pain with vomiting.

## 2023-02-06 NOTE — Discharge Instructions (Signed)
Please stop taking the tramadol.  It is also not advisable that you take ibuprofen as this will worsen your gastritis.  You may take Tylenol 650 mg every 6-8 hours and also apply ice to the area as we discussed to help with your pain.  You may take the nausea medicine as prescribed to help with your nausea and vomiting.  Please stay away from gastric irritants including spicy food, acidic food, alcohol, caffeine.  Please return for any new, worsening, or change in symptoms or other concerns.  Please follow-up with your dentist as scheduled.

## 2023-06-30 ENCOUNTER — Emergency Department
Admission: EM | Admit: 2023-06-30 | Discharge: 2023-06-30 | Disposition: A | Discharge status: Home / Self Care | Payer: 59 | Attending: Emergency Medicine | Admitting: Emergency Medicine

## 2023-06-30 ENCOUNTER — Other Ambulatory Visit: Payer: Self-pay

## 2023-06-30 DIAGNOSIS — L723 Sebaceous cyst: Secondary | ICD-10-CM | POA: Insufficient documentation

## 2023-06-30 DIAGNOSIS — L729 Follicular cyst of the skin and subcutaneous tissue, unspecified: Secondary | ICD-10-CM

## 2023-06-30 MED ORDER — KETOROLAC TROMETHAMINE 30 MG/ML IJ SOLN
30.0000 mg | Freq: Once | INTRAMUSCULAR | Status: AC
  Administered 2023-06-30: 30 mg via INTRAMUSCULAR
  Filled 2023-06-30: qty 1

## 2023-06-30 NOTE — ED Triage Notes (Signed)
Pt to ED via POV c/o knot to middle of chest. Pt says it has been there for a year but pain has gotten worse in the past few days. Pt also says it has gotten a bigger. Pt saw doctor for same 5months ago and said there was anything to do for it at that time. Denies CP, SOB, fevers, dizziness

## 2023-06-30 NOTE — Discharge Instructions (Signed)
Please take Tylenol and ibuprofen/Advil for your pain.  It is safe to take them together, or to alternate them every few hours.  Take up to 1000mg of Tylenol at a time, up to 4 times per day.  Do not take more than 4000 mg of Tylenol in 24 hours.  For ibuprofen, take 400-600 mg, 3 - 4 times per day.  

## 2023-06-30 NOTE — ED Provider Notes (Signed)
[facility] Provider Note    Event Date/Time   First MD Initiated Contact with Patient 06/30/23 2310     (approximate)   History   Abscess (On chest)   HPI  Christine Simmons is a 47 y.o. female who presents to the ED for evaluation of spot to the skin on her chest.  Patient reports greater than a year of a painful palpable cyst to her midline chest that has become more uncomfortable over the past few days.  Denies any initial trauma or injuries.  She reports no fevers, redness or other changes, just increased acute on chronic pain in the past few days.  Physical Exam   Triage Vital Signs: ED Triage Vitals  Encounter Vitals Group     BP 06/30/23 2129 116/70     Systolic BP Percentile --      Diastolic BP Percentile --      Pulse Rate 06/30/23 2129 (!) 55     Resp 06/30/23 2129 17     Temp 06/30/23 2129 98.3 F (36.8 C)     Temp Source 06/30/23 2129 Oral     SpO2 06/30/23 2129 100 %     Weight 06/30/23 2130 111 lb (50.3 kg)     Height 06/30/23 2130 5\' 2"  (1.575 m)     Head Circumference --      Peak Flow --      Pain Score 06/30/23 2129 8     Pain Loc --      Pain Education --      Exclude from Growth Chart --     Most recent vital signs: Vitals:   06/30/23 2129  BP: 116/70  Pulse: (!) 55  Resp: 17  Temp: 98.3 F (36.8 C)  SpO2: 100%    General: Awake, no distress.  CV:  Good peripheral perfusion.  Resp:  Normal effort.  Abd:  No distention.  MSK:  No deformity noted.  Neuro:  No focal deficits appreciated. Other:  Palpable, mobile simple cyst the subcutaneous tissue of the midline chest overlying her sternum.  No overlying erythema, induration.  Nothing draining.   ED Results / Procedures / Treatments   Labs (all labs ordered are listed, but only abnormal results are displayed) Labs Reviewed - No data to display  EKG   RADIOLOGY   Official radiology report(s): No results found.  PROCEDURES and  INTERVENTIONS:  Procedures  Medications  ketorolac (TORADOL) 30 MG/ML injection 30 mg (has no administration in time range)     IMPRESSION / MDM / ASSESSMENT AND PLAN / ED COURSE  I reviewed the triage vital signs and the nursing notes.  Differential diagnosis includes, but is not limited to, subcutaneous cyst, abscess, cellulitis, malignancy  Patient presents with acute on chronic pain from what looks like a subcutaneous cyst suitable for outpatient management.  Appears to be a simple cyst on exam.  I discussed options with the patient and offered to perform I&D, but she declines preferring to see a surgeon for definitive treatment which I think is reasonable.  We discussed pain management at home and I provided referral information for general surgery.      FINAL CLINICAL IMPRESSION(S) / ED DIAGNOSES   Final diagnoses:  Subcutaneous cyst     Rx / DC Orders   ED Discharge Orders     None        Note:  This document was prepared using Dragon voice recognition software and may include unintentional  dictation errors.   Delton Prairie, MD 06/30/23 [phone]

## 2023-07-13 ENCOUNTER — Ambulatory Visit: Payer: 59 | Admitting: Surgery

## 2023-07-13 ENCOUNTER — Encounter: Payer: Self-pay | Admitting: Surgery

## 2023-07-13 VITALS — BP 110/73 | HR 76 | Temp 98.4°F | Ht 63.0 in | Wt 113.6 lb

## 2023-07-13 DIAGNOSIS — L720 Epidermal cyst: Secondary | ICD-10-CM | POA: Diagnosis not present

## 2023-07-13 NOTE — Patient Instructions (Addendum)
If you have any concerns or questions, please feel free to call our office. See follow up appointment.   Epidermoid Cyst  An epidermoid cyst, also called an epidermal cyst, is a small lump under your skin. The cyst contains a substance called keratin. Do not try to pop or open the cyst yourself. What are the causes? A blocked hair follicle. A hair that curls and re-enters the skin instead of growing straight out of the skin. A blocked pore. Irritated skin. An injury to the skin. Certain conditions that are passed along from parent to child. Human papillomavirus (HPV). This happens rarely when cysts occur on the bottom of the feet. Long-term sun damage to the skin. What increases the risk? Having acne. Being female. Having an injury to the skin. Being past puberty. Having certain conditions caused by genes (genetic disorder) What are the signs or symptoms? These cysts are usually harmless, but they can get infected. Symptoms of infection may include: Redness. Inflammation. Tenderness. Warmth. Fever. A bad-smelling substance that drains from the cyst. Pus that drains from the cyst. How is this treated? In many cases, epidermoid cysts go away on their own without treatment. If a cyst becomes infected, treatment may include: Opening and draining the cyst, done by a doctor. After draining, you may need minor surgery to remove the rest of the cyst. Antibiotic medicine. Shots of medicines (steroids) that help to reduce inflammation. Surgery to remove the cyst. Surgery may be done if the cyst: Becomes large. Bothers you. Has a chance of turning into cancer. Do not try to open a cyst yourself. Follow these instructions at home: Medicines Take over-the-counter and prescription medicines as told by your doctor. If you were prescribed an antibiotic medicine, take it as told by your doctor. Do not stop taking it even if you start to feel better. General instructions Keep the area around  your cyst clean and dry. Wear loose, dry clothing. Avoid touching your cyst. Check your cyst every day for signs of infection. Check for: Redness, swelling, or pain. Fluid or blood. Warmth. Pus or a bad smell. Keep all follow-up visits. How is this prevented? Wear clean, dry, clothing. Avoid wearing tight clothing. Keep your skin clean and dry. Take showers or baths every day. Contact a doctor if: Your cyst has symptoms of infection. Your condition does not improve or gets worse. You have a cyst that looks different from other cysts you have had. You have a fever. Get help right away if: Redness spreads from the cyst into the area close by. Summary An epidermoid cyst is a small lump under your skin. If a cyst becomes infected, treatment may include surgery to open and drain the cyst, or to remove it. Take over-the-counter and prescription medicines only as told by your doctor. Contact a doctor if your condition is not improving or is getting worse. Keep all follow-up visits. This information is not intended to replace advice given to you by your health care provider. Make sure you discuss any questions you have with your health care provider. Document Revised: 01/17/2020 Document Reviewed: 01/18/2020 Elsevier Patient Education  2024 [employer].

## 2023-07-15 NOTE — Progress Notes (Signed)
Surgical Consultation  07/15/2023  Christine Simmons is an 47 y.o. female.   Chief Complaint  Patient presents with   New Patient (Initial Visit)    Sebaceous cyst     HPI: 47 yo female seen in consultation at the request of Dr. Nathanial Rancher  for EIC sternum. It hs been present for several years but it has grown in size. Some discomfort. No fevers no chills. Recent mammo w/o concerning lesion. Cxr and CT A/P personally reviewed showing chronic biliary tree dilation after chole. She is not on any anticoagulants. She is able to perform more than 4 mets w/o sob or c/p. Recent CBc and CMP nml  Past Medical History:  Diagnosis Date   Anxiety    Arthritis    low back and neck   Chronic kidney disease    kidney stones and infection   Degenerative disc disease, cervical    Endometriosis    Family history of adverse reaction to anesthesia    "unable to put mother to sleep"   Frequent headaches    GERD (gastroesophageal reflux disease)    Lichen    Lower back pain    Menorrhagia    Neck pain    Ovarian cyst     Past Surgical History:  Procedure Laterality Date   ABDOMINAL HYSTERECTOMY     ANKLE SURGERY Left    CERVICAL DISC ARTHROPLASTY N/A 08/03/2019   Procedure: CERVICAL ANTERIOR DISC ARTHROPLASTY C4-6;  Surgeon: Venetia Night, MD;  Location: [facility] ORS;  Service: Neurosurgery;  Laterality: N/A;   CESAREAN SECTION     CHOLECYSTECTOMY N/A 04/10/2015   Procedure: LAPAROSCOPIC CHOLECYSTECTOMY WITH INTRAOPERATIVE CHOLANGIOGRAM;  Surgeon: Lattie Haw, MD;  Location: [facility] ORS;  Service: General;  Laterality: N/A;   ESOPHAGOGASTRODUODENOSCOPY (EGD) WITH PROPOFOL N/A 12/12/2016   Procedure: ESOPHAGOGASTRODUODENOSCOPY (EGD) WITH PROPOFOL;  Surgeon: Christena Deem, MD;  Location: [facility] ENDOSCOPY;  Service: Endoscopy;  Laterality: N/A;   GALLBLADDER SURGERY     LAPAROSCOPIC VAGINAL HYSTERECTOMY WITH SALPINGECTOMY Bilateral 07/07/2016   Procedure: LAPAROSCOPIC ASSISTED VAGINAL  HYSTERECTOMY WITH SALPINGECTOMY;  Surgeon: Suzy Bouchard, MD;  Location: [facility] ORS;  Service: Gynecology;  Laterality: Bilateral;    Family History  Problem Relation Age of Onset   Hypertension Mother    COPD Mother    Arthritis Mother    Gallbladder disease Maternal Grandmother    Heart disease Maternal Grandmother    Hyperlipidemia Maternal Grandmother    Intellectual disability Maternal Grandmother    Hypertension Maternal Grandmother    Heart attack Maternal Grandmother    Hypertension Father    Alcohol abuse Maternal Grandfather    Cancer Maternal Grandfather    COPD Maternal Grandfather    Autoimmune disease Paternal Aunt     Social History:  reports that she quit smoking about 26 years ago. Her smoking use included cigarettes. She has never used smokeless tobacco. She reports current drug use. Drug: Marijuana. She reports that she does not drink alcohol.  Allergies:  Allergies  Allergen Reactions   Sulfa Antibiotics Nausea And Vomiting, Nausea Only and Rash    Other reaction(s): Vomiting Hallucinations    Medications reviewed.     ROS Full ROS performed and is otherwise negative other than what is stated in the HPI    BP 110/73 (BP Location: Right Arm, Patient Position: Sitting, Cuff Size: Normal)   Pulse 76   Temp 98.4 F (36.9 C) (Oral)   Ht 5\' 3"  (1.6 m)   Wt 113 lb 9.6 oz (  51.5 kg)   LMP 06/18/2016 (Exact Date)   SpO2 98%   BMI 20.12 kg/m   Physical Exam Vitals reviewed. Exam conducted with a chaperone present.  Constitutional:      Appearance: Normal appearance. She is normal weight. She is not ill-appearing.  Cardiovascular:     Rate and Rhythm: Normal rate and regular rhythm.     Heart sounds: No murmur heard.    No friction rub.  Pulmonary:     Effort: Pulmonary effort is normal. No respiratory distress.     Breath sounds: Normal breath sounds. No stridor. No rhonchi.     Comments: BREAST: no evidence of primary breast lesions, no  axillary LAD, skin and nipple nml, On mid sternum there is a 3 cm mobile mass within the subcutaneous tissue c/w EIC Abdominal:     General: Abdomen is flat. There is no distension.     Palpations: Abdomen is soft. There is no mass.     Tenderness: There is no abdominal tenderness. There is no guarding.     Hernia: No hernia is present.  Musculoskeletal:        General: No swelling or tenderness. Normal range of motion.     Cervical back: Normal range of motion and neck supple.  Skin:    General: Skin is warm and dry.     Capillary Refill: Capillary refill takes less than 2 seconds.  Neurological:     General: No focal deficit present.     Mental Status: She is alert and oriented to person, place, and time.  Psychiatric:        Mood and Affect: Mood normal.        Behavior: Behavior normal.        Thought Content: Thought content normal.        Judgment: Judgment normal.     Assessment/Plan: EIC sternum d/w the pt in detail about her disease process given the growth over time and discomfort excision is indicated. D/w her about the procedure the R, B and possible complications including wound healing issues, deformity, infection and bleeding. We can do it here in the office under local. She wishes to proceed w excision here in the office. I spent 40 min in this encounter including pers reviewing record, images, placing orders and performing documentation Copy of the report sent to referring provider  Sterling Big, MD [facility] General Surgeon

## 2023-07-20 ENCOUNTER — Encounter: Payer: Self-pay | Admitting: Surgery

## 2023-07-20 ENCOUNTER — Ambulatory Visit: Payer: 59 | Admitting: Surgery

## 2023-07-20 VITALS — BP 107/60 | HR 68 | Temp 98.2°F | Ht 63.0 in | Wt 114.0 lb

## 2023-07-20 DIAGNOSIS — L720 Epidermal cyst: Secondary | ICD-10-CM

## 2023-07-20 NOTE — Patient Instructions (Addendum)
We have removed a Cyst in our office today.  You have sutures under the skin that will dissolve and also dermabond (skin glue) on top of your skin which will come off on it's own in 10-14 days.  You may use Ibuprofen or Tylenol as needed for pain control. Use the ice pack 3-4 times a day for the next two days for any achiness.  You may shower tomorrow evening, do not scrub at the area.  Avoid Strenuous activities that will make you sweat during the next 48 hours to avoid the glue coming off prematurely. Avoid activities that will place pressure to this area of the body for 1-2 weeks to avoid re-injury to incision site.  We will call you call you with your results. If you have any questions or concerns prior to this appointment, call our office and speak with a nurse.    Excision of Skin Cysts or Lesions Excision of a skin lesion refers to the removal of a section of skin by making small cuts (incisions) in the skin. This procedure may be done to remove a cancerous (malignant) or noncancerous (benign) growth on the skin. It is typically done to treat or prevent cancer or infection. It may also be done to improve cosmetic appearance. The procedure may be done to remove: Cancerous growths, such as basal cell carcinoma, squamous cell carcinoma, or melanoma. Noncancerous growths, such as a cyst or lipoma. Growths, such as moles or skin tags, which may be removed for cosmetic reasons.  Various excision or surgical techniques may be used depending on your condition, the location of the lesion, and your overall health. Tell a health care provider about: Any allergies you have. All medicines you are taking, including vitamins, herbs, eye drops, creams, and over-the-counter medicines. Any problems you or family members have had with anesthetic medicines. Any blood disorders you have. Any surgeries you have had. Any medical conditions you have. Whether you are pregnant or may be pregnant. What are  the risks? Generally, this is a safe procedure. However, problems may occur, including: Bleeding. Infection. Scarring. Recurrence of the cyst, lipoma, or cancer. Changes in skin sensation or appearance, such as discoloration or swelling. Reaction to the anesthetics. Allergic reaction to surgical materials or ointments. Damage to nerves, blood vessels, muscles, or other structures. Continued pain.  What happens before the procedure? Ask your health care provider about: Changing or stopping your regular medicines. This is especially important if you are taking diabetes medicines or blood thinners. Taking medicines such as aspirin and ibuprofen. These medicines can thin your blood. Do not take these medicines before your procedure if your health care provider instructs you not to. You may be asked to take certain medicines. You may be asked to stop smoking. You may have an exam or testing. What happens during the procedure? To reduce your risk of infection: Your health care team will wash or sanitize their hands. Your skin will be washed with soap. You will be given a medicine to numb the area (local anesthetic). One of the following excision techniques will be performed. At the end of any of these procedures, antibiotic ointment will be applied as needed. Each of the following techniques may vary among health care providers and hospitals. Complete Surgical Excision The area of skin that needs to be removed will be marked with a pen. Using a small scalpel or scissors, the surgeon will gently cut around and under the lesion until it is completely removed. The lesion will  be placed in a fluid and sent to the lab for examination. If necessary, bleeding will be controlled with a device that delivers heat (electrocautery). The edges of the wound may be stitched (sutured) together, and a bandage (dressing) or surgical glue will be applied. This procedure may be performed to treat a cancerous  growth or a noncancerous cyst or lesion.  What happens after the procedure? Return to your normal activities as told by your health care provider. Report any excessive bleeding, spreading redness, or increased pain.

## 2023-07-22 ENCOUNTER — Encounter: Payer: Self-pay | Admitting: Surgery

## 2023-07-22 NOTE — Progress Notes (Signed)
DIAGNOSIS Symptomatic soft tissue mass  PROCEDURES 1.  Excision of Epidermal cyst chest wall 3.5 cm 2.  Intermediate closure measuring 4cm  ANESTHESIA: Lidocaine 1% with epinephrine  EBL: Minimal  FINDINGS: Epidermal cyst sternum  After informed consent was obtained the patient was prepped and draped in the usual sterile fashion.  Lidocaine 1% was injected over the area of interest.  15 blade knife used to create an incision and the subcutaneous tissue was dissected free with hemostats.  The mass was dissected free from adjacent structures using Metzenbaum scissors.  It was sent for permanent pathology.  Hemostasis obtained with pressure.  The wound was closed in a 2 layer fashion with dermal layer using interrupted 3-0 Vicryl.  The skin was closed in a subcuticular fashion using 4-0 Monocryl.  Dermabond was applied.  No complications. The  patient tolerated procedure well

## 2023-07-23 LAB — SURGICAL PATHOLOGY

## 2023-09-09 ENCOUNTER — Other Ambulatory Visit: Payer: Self-pay

## 2023-09-09 DIAGNOSIS — N390 Urinary tract infection, site not specified: Secondary | ICD-10-CM | POA: Insufficient documentation

## 2023-09-09 DIAGNOSIS — R300 Dysuria: Secondary | ICD-10-CM | POA: Diagnosis present

## 2023-09-09 NOTE — ED Triage Notes (Signed)
Pt reports dysuria that began tonight and pink urine x1 week. Pt also reports urinary frequency.

## 2023-09-10 ENCOUNTER — Emergency Department
Admission: EM | Admit: 2023-09-10 | Discharge: 2023-09-10 | Disposition: A | Discharge status: Home / Self Care | Payer: 59 | Attending: Emergency Medicine | Admitting: Emergency Medicine

## 2023-09-10 DIAGNOSIS — N390 Urinary tract infection, site not specified: Secondary | ICD-10-CM

## 2023-09-10 DIAGNOSIS — R319 Hematuria, unspecified: Secondary | ICD-10-CM

## 2023-09-10 LAB — URINALYSIS, ROUTINE W REFLEX MICROSCOPIC
Bacteria, UA: NONE SEEN
RBC / HPF: >50 RBC/hpf (ref 0–5)
WBC, UA: >50 WBC/hpf (ref 0–5)

## 2023-09-10 MED ORDER — CEPHALEXIN 500 MG PO CAPS: 500.0000 mg | ORAL_CAPSULE | Freq: Four times a day (QID) | ORAL | 0 refills | Status: AC

## 2023-09-10 MED ORDER — CEPHALEXIN 500 MG PO CAPS
500.0000 mg | ORAL_CAPSULE | Freq: Once | ORAL | Status: AC
  Administered 2023-09-10: 500 mg via ORAL
  Filled 2023-09-10: qty 1

## 2023-09-10 NOTE — ED Provider Notes (Signed)
[facility] Provider Note    Event Date/Time   First MD Initiated Contact with Patient 09/10/23 0112     (approximate)   History   Dysuria   HPI  Christine Simmons is a 47 y.o. female   Past medical history of no significant past medical history presents to the emergency department with 1 week of pink urine and then today developed dysuria and frequency.  Suprapubic discomfort.  No fevers or chills.  No other acute medical complaints      Physical Exam   Triage Vital Signs: ED Triage Vitals  Encounter Vitals Group     BP 09/09/23 2306 125/79     Systolic BP Percentile --      Diastolic BP Percentile --      Pulse Rate 09/09/23 2306 61     Resp 09/09/23 2306 16     Temp 09/09/23 2306 98.4 F (36.9 C)     Temp src --      SpO2 09/09/23 2306 100 %     Weight 09/09/23 2305 115 lb (52.2 kg)     Height 09/09/23 2305 5\' 3"  (1.6 m)     Head Circumference --      Peak Flow --      Pain Score 09/09/23 2305 0     Pain Loc --      Pain Education --      Exclude from Growth Chart --     Most recent vital signs: Vitals:   09/09/23 2306  BP: 125/79  Pulse: 61  Resp: 16  Temp: 98.4 F (36.9 C)  SpO2: 100%    General: Awake, no distress.  CV:  Good peripheral perfusion.  Resp:  Normal effort.  Abd:  No distention.  Other:  Awake alert comfortable appearing with normal hemodynamics and afebrile.  Soft nontender abdomen.   ED Results / Procedures / Treatments   Labs (all labs ordered are listed, but only abnormal results are displayed) Labs Reviewed  URINALYSIS, ROUTINE W REFLEX MICROSCOPIC - Abnormal; Notable for the following components:      Result Value   Color, Urine RED (*)    APPearance TURBID (*)    Glucose, UA   (*)    Value: TEST NOT REPORTED DUE TO COLOR INTERFERENCE OF URINE PIGMENT   Hgb urine dipstick   (*)    Value: TEST NOT REPORTED DUE TO COLOR INTERFERENCE OF URINE PIGMENT   Bilirubin Urine   (*)    Value: TEST NOT  REPORTED DUE TO COLOR INTERFERENCE OF URINE PIGMENT   Ketones, ur   (*)    Value: TEST NOT REPORTED DUE TO COLOR INTERFERENCE OF URINE PIGMENT   Protein, ur   (*)    Value: TEST NOT REPORTED DUE TO COLOR INTERFERENCE OF URINE PIGMENT   Nitrite   (*)    Value: TEST NOT REPORTED DUE TO COLOR INTERFERENCE OF URINE PIGMENT   Leukocytes,Ua   (*)    Value: TEST NOT REPORTED DUE TO COLOR INTERFERENCE OF URINE PIGMENT   Non Squamous Epithelial PRESENT (*)    All other components within normal limits  URINE CULTURE     I ordered and reviewed the above labs they are notable for red-yellow urine with no bacteria but does have inflammatory changes rater than 50 white blood cells.    PROCEDURES:  Critical Care performed: No  Procedures   MEDICATIONS ORDERED IN ED: Medications  cephALEXin (KEFLEX) capsule 500 mg (has no  administration in time range)    IMPRESSION / MDM / ASSESSMENT AND PLAN / ED COURSE  I reviewed the triage vital signs and the nursing notes.                                Patient's presentation is most consistent with acute presentation with potential threat to life or bodily function.  Differential diagnosis includes, but is not limited to, urinary tract infection, pyelonephritis, renal colic, urothelial cancer, considered but less likely sepsis   The patient is on the cardiac monitor to evaluate for evidence of arrhythmia and/or significant heart rate changes.  MDM:    Patient with dysuria frequency and pink-tinged urine with urinalysis showing white blood cells and red blood cells but no bacteria, sent for culture.  Clinical symptoms consistent with urinary tract infection so we will give antibiotic course.  No flank pain to suggest pyelonephritis or renal colic/stone.  Does not appear septic.  Urine culture was sent, will review with PMD.  Also if symptoms do not improve with antibiotic course, she understands to follow-up with PMD for urology referral for  further workup for other causes.  Return precautions given.  Discharge       FINAL CLINICAL IMPRESSION(S) / ED DIAGNOSES   Final diagnoses:  Lower urinary tract infectious disease  Hematuria, unspecified type     Rx / DC Orders   ED Discharge Orders          Ordered    cephALEXin (KEFLEX) 500 MG capsule  4 times daily        09/10/23 0134             Note:  This document was prepared using Dragon voice recognition software and may include unintentional dictation errors.    Pilar Jarvis, MD 09/10/23 [phone]

## 2023-09-10 NOTE — Discharge Instructions (Signed)
Take antibiotics for the full course as prescribed.  If your symptoms do not get better after antibiotics, call your doctor to discuss your symptoms and ask about urology referral for further evaluation as needed for other causes of your symptoms.   Thank you for choosing [place] for your health care today!  Please see your primary doctor this week for a follow up appointment.   If you have any new, worsening, or unexpected symptoms call your doctor right away or come back to the emergency department for reevaluation.  It was my pleasure to care for you today.   Daneil Dan Modesto Charon, MD

## 2023-09-12 LAB — URINE CULTURE: Culture: >=100000 — AB

## 2023-11-16 ENCOUNTER — Telehealth: Payer: 59 | Admitting: Physician Assistant

## 2023-11-16 DIAGNOSIS — A084 Viral intestinal infection, unspecified: Secondary | ICD-10-CM

## 2023-11-16 MED ORDER — ONDANSETRON 4 MG PO TBDP: 4.0000 mg | ORAL_TABLET | Freq: Three times a day (TID) | ORAL | 0 refills | Status: DC | PRN

## 2023-11-16 NOTE — Progress Notes (Signed)
 E-Visit for Nausea and Vomiting   We are sorry that you are not feeling well. Here is how we plan to help!  Based on what you have shared with me it looks like you have a Virus that is irritating your GI tract.  Vomiting is the forceful emptying of a portion of the stomach's content through the mouth.  Although nausea and vomiting can make you feel miserable, it's important to remember that these are not diseases, but rather symptoms of an underlying illness.  When we treat short term symptoms, we always caution that any symptoms that persist should be fully evaluated in a medical office.  I have prescribed a medication that will help alleviate your symptoms and allow you to stay hydrated:  Zofran 4 mg 1 tablet every 8 hours as needed for nausea and vomiting  For your diarrhea symptoms you may take Imodium 2 mg tablets that are over the counter at your local pharmacy. Take two tablet now and then one after each loose stool up to 6 a day.    HOME CARE: Drink clear liquids.  This is very important! Dehydration (the lack of fluid) can lead to a serious complication.  Start off with 1 tablespoon every 5 minutes for 8 hours. You may begin eating bland foods after 8 hours without vomiting.  Start with saltine crackers, white bread, rice, mashed potatoes, applesauce. After 48 hours on a bland diet, you may resume a normal diet. Try to go to sleep.  Sleep often empties the stomach and relieves the need to vomit.  GET HELP RIGHT AWAY IF:  Your symptoms do not improve or worsen within 2 days after treatment. You have a fever for over 3 days. You cannot keep down fluids after trying the medication.  MAKE SURE YOU:  Understand these instructions. Will watch your condition. Will get help right away if you are not doing well or get worse.    Thank you for choosing an e-visit.  Your e-visit answers were reviewed by a board certified advanced clinical practitioner to complete your personal care  plan. Depending upon the condition, your plan could have included both over the counter or prescription medications.  Please review your pharmacy choice. Make sure the pharmacy is open so you can pick up prescription now. If there is a problem, you may contact your provider through [employer] and have the prescription routed to another pharmacy.  Your safety is important to [place]. If you have drug allergies check your prescription carefully.   For the next 24 hours you can use MyChart to ask questions about today's visit, request a non-urgent call back, or ask for a work or school excuse. You will get an email in the next two days asking about your experience. I hope that your e-visit has been valuable and will speed your recovery.   I have spent 5 minutes in review of e-visit questionnaire, review and updating patient chart, medical decision making and response to patient.   Margaretann Loveless, PA-C

## 2023-11-30 DIAGNOSIS — Z113 Encounter for screening for infections with a predominantly sexual mode of transmission: Secondary | ICD-10-CM

## 2024-05-25 ENCOUNTER — Encounter

## 2024-06-25 ENCOUNTER — Telehealth: Admitting: Physician Assistant

## 2024-06-25 DIAGNOSIS — J019 Acute sinusitis, unspecified: Secondary | ICD-10-CM | POA: Diagnosis not present

## 2024-06-25 DIAGNOSIS — B9789 Other viral agents as the cause of diseases classified elsewhere: Secondary | ICD-10-CM

## 2024-06-25 MED ORDER — IPRATROPIUM BROMIDE 0.03 % NA SOLN: 2.0000 | Freq: Two times a day (BID) | NASAL | 0 refills | Status: DC

## 2024-06-25 NOTE — Progress Notes (Signed)
E-Visit for Sinus Problems  We are sorry that you are not feeling well.  Here is how we plan to help!  Based on what you have shared with me it looks like you have sinusitis.  Sinusitis is inflammation and infection in the sinus cavities of the head.  Based on your presentation I believe you most likely have Acute Viral Sinusitis.This is an infection most likely caused by a virus. There is not specific treatment for viral sinusitis other than to help you with the symptoms until the infection runs its course.  You may use an oral decongestant such as Mucinex D or if you have glaucoma or high blood pressure use plain Mucinex. Saline nasal spray help and can safely be used as often as needed for congestion, I have prescribed: Ipratropium Bromide nasal spray 0.03% 2 sprays in eah nostril 2-3 times a day  Some authorities believe that zinc sprays or the use of Echinacea may shorten the course of your symptoms.  Sinus infections are not as easily transmitted as other respiratory infection, however we still recommend that you avoid close contact with loved ones, especially the very young and elderly.  Remember to wash your hands thoroughly throughout the day as this is the number one way to prevent the spread of infection!  I have sent a work note to Pharmacologist. You can find by going to the Menu on your homepage, scrolling down to the Communications section, and selecting Letters. Let us know if you have any issue locating. Take care and feel better soon!   Home Care: Only take medications as instructed by your medical team. Do not take these medications with alcohol. A steam or ultrasonic humidifier can help congestion.  You can place a towel over your head and breathe in the steam from hot water coming from a faucet. Avoid close contacts especially the very young and the elderly. Cover your mouth when you cough or sneeze. Always remember to wash your hands.  Get Help Right Away If: You develop  worsening fever or sinus pain. You develop a severe head ache or visual changes. Your symptoms persist after you have completed your treatment plan.  Make sure you Understand these instructions. Will watch your condition. Will get help right away if you are not doing well or get worse.   Thank you for choosing an e-visit.  Your e-visit answers were reviewed by a board certified advanced clinical practitioner to complete your personal care plan. Depending upon the condition, your plan could have included both over the counter or prescription medications.  Please review your pharmacy choice. Make sure the pharmacy is open so you can pick up prescription now. If there is a problem, you may contact your provider through [employer] and have the prescription routed to another pharmacy.  Your safety is important to [place]. If you have drug allergies check your prescription carefully.   For the next 24 hours you can use MyChart to ask questions about today's visit, request a non-urgent call back, or ask for a work or school excuse. You will get an email in the next two days asking about your experience. I hope that your e-visit has been valuable and will speed your recovery.

## 2024-06-25 NOTE — Progress Notes (Signed)
 I have spent 5 minutes in review of e-visit questionnaire, review and updating patient chart, medical decision making and response to patient.   Elsie Velma Lunger, PA-C

## 2024-07-28 ENCOUNTER — Telehealth: Admitting: Physician Assistant

## 2024-07-28 DIAGNOSIS — H8110 Benign paroxysmal vertigo, unspecified ear: Secondary | ICD-10-CM

## 2024-07-28 DIAGNOSIS — R42 Dizziness and giddiness: Secondary | ICD-10-CM

## 2024-07-28 MED ORDER — MECLIZINE HCL 25 MG PO TABS: 25.0000 mg | ORAL_TABLET | Freq: Three times a day (TID) | ORAL | 0 refills | Status: DC | PRN

## 2024-07-28 MED ORDER — IPRATROPIUM BROMIDE 0.03 % NA SOLN: 2.0000 | Freq: Two times a day (BID) | NASAL | 0 refills | Status: DC

## 2024-07-28 MED ORDER — ONDANSETRON 4 MG PO TBDP: 4.0000 mg | ORAL_TABLET | Freq: Three times a day (TID) | ORAL | 0 refills | Status: DC | PRN

## 2024-07-28 NOTE — Patient Instructions (Signed)
 Christine Simmons, thank you for joining Christine Velma Lunger, PA-C for today's virtual visit.  While this provider is not your primary care provider (PCP), if your PCP is located in our provider database this encounter information will be shared with them immediately following your visit.   A [facility] MyChart account gives you access to today's visit and all your visits, tests, and labs performed at [facility]  click here if you don't have a [facility] MyChart account or go to [email or web address]  Consent: (Patient) Christine Simmons provided verbal consent for this virtual visit at the beginning of the encounter.  Current Medications:  Current Outpatient Medications:    ipratropium (ATROVENT ) 0.03 % nasal spray, Place 2 sprays into both nostrils every 12 (twelve) hours., Disp: 30 mL, Rfl: 0   meclizine (ANTIVERT) 25 MG tablet, Take 1 tablet (25 mg total) by mouth 3 (three) times daily as needed for dizziness., Disp: 30 tablet, Rfl: 0   pantoprazole (PROTONIX) 40 MG tablet, Take 40 mg by mouth daily., Disp: , Rfl:    Medications ordered in this encounter:  No orders of the defined types were placed in this encounter.    *If you need refills on other medications prior to your next appointment, please contact your pharmacy*  Follow-Up: Call back or seek an in-person evaluation if the symptoms worsen or if the condition fails to improve as anticipated.  [facility] [phone]  Other Instructions Hydrate and rest. Restart Atrovent  nasal spray. Consider OTC antihistamine like Claritin or Xyzal. Take prescribed medications as directed. Start exercises below. If you note any non-resolving, new, or worsening symptoms despite treatment, please seek an in-person evaluation ASAP.   How to Perform the Epley Maneuver The Epley maneuver is an exercise that relieves symptoms of vertigo. Vertigo is the feeling that you or your surroundings are moving when  they are not. When you feel vertigo, you may feel like the room is spinning and may have trouble walking. The Epley maneuver is used for a type of vertigo caused by a calcium deposit in a part of the inner ear. The maneuver involves changing head positions to help the deposit move out of the area. You can do this maneuver at home whenever you have symptoms of vertigo. You can repeat it in 24 hours if your vertigo has not gone away. Even though the Epley maneuver may relieve your vertigo for a few weeks, it is possible that your symptoms will return. This maneuver relieves vertigo, but it does not relieve dizziness. What are the risks? If it is done correctly, the Epley maneuver is considered safe. Sometimes it can lead to dizziness or nausea that goes away after a short time. If you develop other symptoms--such as changes in vision, weakness, or numbness--stop doing the maneuver and call your health care provider. Supplies needed: A bed or table. A pillow. How to do the Epley maneuver     Sit on the edge of a bed or table with your back straight and your legs extended or hanging over the edge of the bed or table. Turn your head halfway toward the affected ear or side as told by your health care provider. Lie backward quickly with your head turned until you are lying flat on your back. Your head should dangle (head-hanging position). You may want to position a pillow under your shoulders. Hold this position for at least 30 seconds. If you feel dizzy or have symptoms of vertigo, continue  to hold the position until the symptoms stop. Turn your head to the opposite direction until your unaffected ear is facing down. Your head should continue to dangle. Hold this position for at least 30 seconds. If you feel dizzy or have symptoms of vertigo, continue to hold the position until the symptoms stop. Turn your whole body to the same side as your head so that you are positioned on your side. Your head will  now be nearly facedown and no longer needs to dangle. Hold for at least 30 seconds. If you feel dizzy or have symptoms of vertigo, continue to hold the position until the symptoms stop. Sit back up. You can repeat the maneuver in 24 hours if your vertigo does not go away. Follow these instructions at home: For 24 hours after doing the Epley maneuver: Keep your head in an upright position. When lying down to sleep or rest, keep your head raised (elevated) with two or more pillows. Avoid excessive neck movements. Activity Do not drive or use machinery if you feel dizzy. After doing the Epley maneuver, return to your normal activities as told by your health care provider. Ask your health care provider what activities are safe for you. General instructions Drink enough fluid to keep your urine pale yellow. Do not drink alcohol. Take over-the-counter and prescription medicines only as told by your health care provider. Keep all follow-up visits. This is important. Preventing vertigo symptoms Ask your health care provider if there is anything you should do at home to prevent vertigo. He or she may recommend that you: Keep your head elevated with two or more pillows while you sleep. Do not sleep on the side of your affected ear. Get up slowly from bed. Avoid sudden movements during the day. Avoid extreme head positions or movement, such as looking up or bending over. Contact a health care provider if: Your vertigo gets worse. You have other symptoms, including: Nausea. Vomiting. Headache. Get help right away if you: Have vision changes. Have a headache or neck pain that is severe or getting worse. Cannot stop vomiting. Have new numbness or weakness in any part of your body. These symptoms may represent a serious problem that is an emergency. Do not wait to see if the symptoms will go away. Get medical help right away. Call your local emergency services (911 in the [place] Do not drive  yourself to the hospital. Summary Vertigo is the feeling that you or your surroundings are moving when they are not. The Epley maneuver is an exercise that relieves symptoms of vertigo. If the Epley maneuver is done correctly, it is considered safe. This information is not intended to replace advice given to you by your health care provider. Make sure you discuss any questions you have with your health care provider. Document Revised: 07/10/2023 Document Reviewed: 07/10/2023 Elsevier Patient Education  2024 Elsevier Inc.    Benign Positional Vertigo Vertigo is the feeling that you or your surroundings are moving when they are not. Benign positional vertigo is the most common form of vertigo. This is usually a harmless condition (benign). This condition is positional. This means that symptoms are triggered by certain movements and positions. This condition can be dangerous if it occurs while you are doing something that could cause harm to yourself or others. This includes activities such as driving or operating machinery. What are the causes? The inner ear has fluid-filled canals that help your brain sense movement and balance. When the fluid moves, the  brain receives messages about your body's position. With benign positional vertigo, calcium crystals in the inner ear break free and disturb the inner ear area. This causes your brain to receive confusing messages about your body's position. What increases the risk? You are more likely to develop this condition if: You are a woman. You are 67 years of age or older. You have recently had a head injury. You have an inner ear disease. What are the signs or symptoms? Symptoms of this condition usually happen when you move your head or your eyes in different directions. Symptoms may start suddenly and usually last for less than a minute. They include: Loss of balance and falling. Feeling like you are spinning or moving. Feeling like your  surroundings are spinning or moving. Nausea and vomiting. Blurred vision. Dizziness. Involuntary eye movement (nystagmus). Symptoms can be mild and cause only minor problems, or they can be severe and interfere with daily life. Episodes of benign positional vertigo may return (recur) over time. Symptoms may also improve over time. How is this diagnosed? This condition may be diagnosed based on: Your medical history. A physical exam of the head, neck, and ears. Positional tests to check for or stimulate vertigo. You may be asked to turn your head and change positions, such as going from sitting to lying down. A health care provider will watch for symptoms of vertigo. You may be referred to a health care provider who specializes in ear, nose, and throat problems (ENT or otolaryngologist) or a provider who specializes in disorders of the nervous system (neurologist). How is this treated?  This condition may be treated in a session in which your health care provider moves your head in specific positions to help the displaced crystals in your inner ear move. Treatment for this condition may take several sessions. Surgery may be needed in severe cases, but this is rare. In some cases, benign positional vertigo may resolve on its own in 2-4 weeks. Follow these instructions at home: Safety Move slowly. Avoid sudden body or head movements or certain positions, as told by your health care provider. Avoid driving or operating machinery until your health care provider says it is safe. Avoid doing any tasks that would be dangerous to you or others if vertigo occurs. If you have trouble walking or keeping your balance, try using a cane for stability. If you feel dizzy or unstable, sit down right away. Return to your normal activities as told by your health care provider. Ask your health care provider what activities are safe for you. General instructions Take over-the-counter and prescription medicines  only as told by your health care provider. Drink enough fluid to keep your urine pale yellow. Keep all follow-up visits. This is important. Contact a health care provider if: You have a fever. Your condition gets worse or you develop new symptoms. Your family or friends notice any behavioral changes. You have nausea or vomiting that gets worse. You have numbness or a prickling and tingling sensation. Get help right away if you: Have difficulty speaking or moving. Are always dizzy or faint. Develop severe headaches. Have weakness in your legs or arms. Have changes in your hearing or vision. Develop a stiff neck. Develop sensitivity to light. These symptoms may represent a serious problem that is an emergency. Do not wait to see if the symptoms will go away. Get medical help right away. Call your local emergency services (911 in the [place] Do not drive yourself to the hospital. Summary  Vertigo is the feeling that you or your surroundings are moving when they are not. Benign positional vertigo is the most common form of vertigo. This condition is caused by calcium crystals in the inner ear that become displaced. This causes a disturbance in an area of the inner ear that helps your brain sense movement and balance. Symptoms include loss of balance and falling, feeling that you or your surroundings are moving, nausea and vomiting, and blurred vision. This condition can be diagnosed based on symptoms, a physical exam, and positional tests. Follow safety instructions as told by your health care provider and keep all follow-up visits. This is important. This information is not intended to replace advice given to you by your health care provider. Make sure you discuss any questions you have with your health care provider. Document Revised: 05/04/2023 Document Reviewed: 05/04/2023 Elsevier Patient Education  2024 Elsevier Inc.   If you have been instructed to have an in-person evaluation today at  a local Urgent Care facility, please use the link below. It will take you to a list of all of our available [facility], including address, phone number and hours of operation. Please do not delay care.  [facility]  If you or a family member do not have a primary care provider, use the link below to schedule a visit and establish care. When you choose a [facility] primary care physician or advanced practice provider, you gain a long-term partner in health. Find a Primary Care Provider  Learn more about 's in-office and virtual care options:  - Get Care Now

## 2024-07-28 NOTE — Progress Notes (Signed)
 E Visit for Motion Sickness  We are sorry that you are not feeling well. Here is how we plan to help!  Based on what you have shared with me it looks like you have symptoms of motion sickness.  I have prescribed a medication that will help prevent or alleviate your symptoms:  Meclizine 25mg  by mouth three times per day as needed for nausea/motion sickness   Prevention:  You might feel better if you keep your eyes focused on outside while you are in motion. For example, if you are in a car, sit in the front and look in the direction you are moving; if you are on a boat, stay on the deck and look to the horizon. This helps make what you see match the movement you are feeling, and so you are less likely to feel sick.  You should also avoid reading, watching a movie, texting or reading messages, or looking at things close to you inside the vehicle you are riding in.  Use the seat head rest. Lean your head against the back of the seat or head rest when traveling in vehicles with seats to minimize head movements.  On a ship: When making your reservations, choose a cabin in the middle of the ship and near the waterline. When on board, go up on deck and focus on the horizon.  In an airplane: Request a window seat and look out the window. A seat over the front edge of the wing is the most preferable spot (the degree of motion is the lowest here). Direct the air vent to blow cool air on your face.  On a train: Always face forward and sit near a window.  In a vehicle: Sit in the front seat; if you are the passenger, look at the scenery in the distance. For some people, driving the vehicle (rather than being a passenger) is an instant remedy.  Avoid others who have become nauseous with motion sickness. Seeing and smelling others who have motion sickness may cause you to become sick.  A work note has been provided for you today. It will be available under letters in your MyChart account.   GET  HELP RIGHT AWAY IF:  Your symptoms do not improve or worsen within 2 days after treatment.  You cannot keep down fluids after trying the medication.  Other associated symptoms such as severe headache, visual field changes, fever, or intractable nausea and vomiting.  MAKE SURE YOU:  Understand these instructions. Will watch your condition. Will get help right away if you are not doing well or get worse.  Thank you for choosing an e-visit.  Your e-visit answers were reviewed by a board certified advanced clinical practitioner to complete your personal care plan. Depending upon the condition, your plan could have included both over the counter or prescription medications.  Please review your pharmacy choice. Be sure that the pharmacy you have chosen is open so that you can pick up your prescription now.  If there is a problem you may message your provider in MyChart to have the prescription routed to another pharmacy.  Your safety is important to us . If you have drug allergies check your prescription carefully.   For the next 24 hours, you can use MyChart to ask questions about today's visit, request a non-urgent call back, or ask for a work or school excuse from your e-visit provider.  You will get an e-mail in the next two days asking about your experience. I hope  that your e-visit has been valuable and will speed your recovery.   References or for more information: [email or web address]/ https://my.https://rowe.[email or web address] [email or web address].uptodate.com   I have spent 5 minutes in review of e-visit questionnaire, review and updating patient chart, medical decision making and response to patient.   Delon CHRISTELLA Dickinson, PA-C

## 2024-07-28 NOTE — Progress Notes (Signed)
 Virtual Visit Consent   Christine Simmons, you are scheduled for a virtual visit with a [facility] provider today. Just as with appointments in the office, your consent must be obtained to participate. Your consent will be active for this visit and any virtual visit you may have with one of our providers in the next 365 days. If you have a MyChart account, a copy of this consent can be sent to you electronically.  As this is a virtual visit, video technology does not allow for your provider to perform a traditional examination. This may limit your provider's ability to fully assess your condition. If your provider identifies any concerns that need to be evaluated in person or the need to arrange testing (such as labs, EKG, etc.), we will make arrangements to do so. Although advances in technology are sophisticated, we cannot ensure that it will always work on either your end or our end. If the connection with a video visit is poor, the visit may have to be switched to a telephone visit. With either a video or telephone visit, we are not always able to ensure that we have a secure connection.  By engaging in this virtual visit, you consent to the provision of healthcare and authorize for your insurance to be billed (if applicable) for the services provided during this visit. Depending on your insurance coverage, you may receive a charge related to this service.  I need to obtain your verbal consent now. Are you willing to proceed with your visit today? Christine Simmons has provided verbal consent on 07/28/2024 for a virtual visit (video or telephone). Elsie Velma Lunger, [place]  Date: 07/28/2024 9:47 AM   Virtual Visit via Video Note   I, Elsie Velma Lunger, connected with  Christine Simmons  ([id number], 27-Apr-1976) on 07/28/24 at  9:45 AM EDT by a video-enabled telemedicine application and verified that I am speaking with the correct person using two identifiers.  Location: Patient: Virtual Visit  Location Patient: Home Provider: Virtual Visit Location Provider: Home Office   I discussed the limitations of evaluation and management by telemedicine and the availability of in person appointments. The patient expressed understanding and agreed to proceed.    History of Present Illness: Christine Simmons is a 48 y.o. who identifies as a female who was assigned female at birth, and is being seen today for positional dizziness with turning her head noted on waking this morning. Associated with some nasal congestion, ear pressure and bilateral ear popping. Nausea is present without any episodes of emesis. Denies fever, chills. Denies vision changes, AMS, head trauma. Felt overall fine yesterday. No associated headache. Has been eating and hydrating well. SABRA    HPI: HPI  Problems:  Patient Active Problem List   Diagnosis Date Noted   S/P cervical spinal fusion 08/03/2019   Chronic right hip pain 06/09/2019   Degenerative disc disease, cervical 03/31/2019   Menorrhagia with regular cycle 03/31/2019   Pelvic pain in female 03/31/2019   Cervical radiculopathy 08/10/2018   Chronic bilateral low back pain 08/10/2018   Chronic bilateral thoracic back pain 08/10/2018   Family history of metabolic and nutritional disorder 08/10/2018   Fatigue 08/10/2018   Post-operative state 07/07/2016   Cholelithiasis 04/10/2015   Lichen 03/23/2014    Allergies:  Allergies  Allergen Reactions   Sulfa Antibiotics Nausea And Vomiting, Nausea Only and Rash    Other reaction(s): Vomiting Hallucinations   Medications:  Current Outpatient Medications:    ondansetron  (  ZOFRAN -ODT) 4 MG disintegrating tablet, Take 1 tablet (4 mg total) by mouth every 8 (eight) hours as needed for nausea or vomiting., Disp: 20 tablet, Rfl: 0   ipratropium (ATROVENT ) 0.03 % nasal spray, Place 2 sprays into both nostrils every 12 (twelve) hours., Disp: 30 mL, Rfl: 0   meclizine (ANTIVERT) 25 MG tablet, Take 1 tablet (25 mg total)  by mouth 3 (three) times daily as needed for dizziness., Disp: 30 tablet, Rfl: 0   pantoprazole (PROTONIX) 40 MG tablet, Take 40 mg by mouth daily., Disp: , Rfl:   Observations/Objective: Patient is well-developed, well-nourished in no acute distress.  Resting comfortably at home.  Head is normocephalic, atraumatic.  No labored breathing. Speech is clear and coherent with logical content.  Patient is alert and oriented at baseline.   Assessment and Plan: 1. Benign paroxysmal positional vertigo, unspecified laterality (Primary) - ondansetron  (ZOFRAN -ODT) 4 MG disintegrating tablet; Take 1 tablet (4 mg total) by mouth every 8 (eight) hours as needed for nausea or vomiting.  Dispense: 20 tablet; Refill: 0  Positional vertigo. No alarm signs or symptoms. Associated allergy symptoms, ear pressure and popping as likely trigger. Supportive measures and OTC medications reviewed. Meclizine per orders. Restart Atrovent  nasal spray. Zofran  for nausea. Start home Epley maneuvers. Strict in-person follow-up precautions reviewed.  Follow Up Instructions: I discussed the assessment and treatment plan with the patient. The patient was provided an opportunity to ask questions and all were answered. The patient agreed with the plan and demonstrated an understanding of the instructions.  A copy of instructions were sent to the patient via MyChart unless otherwise noted below.   The patient was advised to call back or seek an in-person evaluation if the symptoms worsen or if the condition fails to improve as anticipated.    Elsie Velma Lunger, PA-C

## 2024-08-02 ENCOUNTER — Telehealth: Admitting: Physician Assistant

## 2024-08-02 DIAGNOSIS — M25512 Pain in left shoulder: Secondary | ICD-10-CM

## 2024-08-02 DIAGNOSIS — M545 Low back pain, unspecified: Secondary | ICD-10-CM | POA: Diagnosis not present

## 2024-08-02 MED ORDER — NAPROXEN 500 MG PO TABS: 500.0000 mg | ORAL_TABLET | Freq: Two times a day (BID) | ORAL | 0 refills | Status: DC

## 2024-08-02 MED ORDER — CYCLOBENZAPRINE HCL 10 MG PO TABS: 10.0000 mg | ORAL_TABLET | Freq: Three times a day (TID) | ORAL | 0 refills | Status: DC | PRN

## 2024-08-02 NOTE — Progress Notes (Signed)
 Message sent to patient requesting further input regarding current symptoms. Awaiting patient response.

## 2024-08-02 NOTE — Progress Notes (Signed)

## 2024-08-16 ENCOUNTER — Emergency Department

## 2024-08-16 ENCOUNTER — Other Ambulatory Visit: Payer: Self-pay

## 2024-08-16 ENCOUNTER — Emergency Department
Admission: EM | Admit: 2024-08-16 | Discharge: 2024-08-16 | Disposition: A | Discharge status: Home / Self Care | Attending: Emergency Medicine | Admitting: Emergency Medicine

## 2024-08-16 DIAGNOSIS — R10A2 Flank pain, left side: Secondary | ICD-10-CM | POA: Diagnosis present

## 2024-08-16 DIAGNOSIS — N132 Hydronephrosis with renal and ureteral calculous obstruction: Secondary | ICD-10-CM | POA: Insufficient documentation

## 2024-08-16 DIAGNOSIS — N200 Calculus of kidney: Secondary | ICD-10-CM

## 2024-08-16 LAB — COMPREHENSIVE METABOLIC PANEL WITH GFR
ALT: 14 U/L (ref 0–44)
AST: 23 U/L (ref 15–41)
Albumin: 4.3 g/dL (ref 3.5–5.0)
Alkaline Phosphatase: 47 U/L (ref 38–126)
Anion gap: 13 (ref 5–15)
BUN: 14 mg/dL (ref 6–20)
CO2: 23 mmol/L (ref 22–32)
Calcium: 9.4 mg/dL (ref 8.9–10.3)
Chloride: 104 mmol/L (ref 98–111)
Creatinine, Ser: 1.05 mg/dL — ABNORMAL HIGH (ref 0.44–1.00)
GFR, Estimated: >60 mL/min (ref >60)
Glucose, Bld: 144 mg/dL — ABNORMAL HIGH (ref 70–99)
Potassium: 3.8 mmol/L (ref 3.5–5.1)
Sodium: 140 mmol/L (ref 135–145)
Total Bilirubin: 1.3 mg/dL — ABNORMAL HIGH (ref 0.0–1.2)
Total Protein: 8.1 g/dL (ref 6.5–8.1)

## 2024-08-16 LAB — URINALYSIS, ROUTINE W REFLEX MICROSCOPIC
Bilirubin Urine: NEGATIVE
Glucose, UA: NEGATIVE mg/dL
Hgb urine dipstick: NEGATIVE
Ketones, ur: NEGATIVE mg/dL
Leukocytes,Ua: NEGATIVE
Nitrite: NEGATIVE
Protein, ur: NEGATIVE mg/dL
Specific Gravity, Urine: 1.016 (ref 1.005–1.030)
pH: 7 (ref 5.0–8.0)

## 2024-08-16 LAB — LIPASE, BLOOD: Lipase: 30 U/L (ref 11–51)

## 2024-08-16 LAB — CBC
HCT: 43.5 % (ref 36.0–46.0)
Hemoglobin: 14.5 g/dL (ref 12.0–15.0)
MCH: 30.3 pg (ref 26.0–34.0)
MCHC: 33.3 g/dL (ref 30.0–36.0)
MCV: 90.8 fL (ref 80.0–100.0)
Platelets: 236 K/uL (ref 150–400)
RBC: 4.79 MIL/uL (ref 3.87–5.11)
RDW: 11.1 % — ABNORMAL LOW (ref 11.5–15.5)
WBC: 7.3 K/uL (ref 4.0–10.5)
nRBC: 0 % (ref 0.0–0.2)

## 2024-08-16 MED ORDER — MORPHINE SULFATE (PF) 4 MG/ML IV SOLN
4.0000 mg | Freq: Once | INTRAVENOUS | Status: AC
  Administered 2024-08-16: 4 mg via INTRAVENOUS
  Filled 2024-08-16: qty 1

## 2024-08-16 MED ORDER — ONDANSETRON 4 MG PO TBDP: 4.0000 mg | ORAL_TABLET | Freq: Three times a day (TID) | ORAL | 0 refills | Status: DC | PRN

## 2024-08-16 MED ORDER — OXYCODONE-ACETAMINOPHEN 5-325 MG PO TABS: 1.0000 | ORAL_TABLET | ORAL | 0 refills | Status: DC | PRN

## 2024-08-16 MED ORDER — ONDANSETRON HCL 4 MG/2ML IJ SOLN
4.0000 mg | Freq: Once | INTRAMUSCULAR | Status: AC
  Administered 2024-08-16: 4 mg via INTRAVENOUS
  Filled 2024-08-16: qty 2

## 2024-08-16 MED ORDER — KETOROLAC TROMETHAMINE 30 MG/ML IJ SOLN
15.0000 mg | Freq: Once | INTRAMUSCULAR | Status: AC
  Administered 2024-08-16: 15 mg via INTRAVENOUS
  Filled 2024-08-16: qty 1

## 2024-08-16 MED ORDER — LACTATED RINGERS IV BOLUS
1000.0000 mL | Freq: Once | INTRAVENOUS | Status: AC
  Administered 2024-08-16: 1000 mL via INTRAVENOUS

## 2024-08-16 MED ORDER — ONDANSETRON 4 MG PO TBDP
4.0000 mg | ORAL_TABLET | Freq: Once | ORAL | Status: DC | PRN
  Filled 2024-08-16: qty 1

## 2024-08-16 NOTE — ED Provider Notes (Signed)
 [facility] Provider Note    Event Date/Time   First MD Initiated Contact with Patient 08/16/24 1147     (approximate)   History   Chief Complaint Abdominal Pain   HPI  Christine Simmons is a 48 y.o. female with past medical history of GERD, kidney stones, and chronic back pain who presents to the ED complaining of abdominal pain.  Patient reports that she had acute onset of pain in her left flank radiating around to the left side of her abdomen around 9:00 this morning.  She has been feeling nauseous since then and has vomited multiple times.  She denies any changes in her bowel movements and has not had any fevers, dysuria, or hematuria.  She is status post hysterectomy.     Physical Exam   Triage Vital Signs: ED Triage Vitals  Encounter Vitals Group     BP 08/16/24 1130 115/88     Girls Systolic BP Percentile --      Girls Diastolic BP Percentile --      Boys Systolic BP Percentile --      Boys Diastolic BP Percentile --      Pulse Rate 08/16/24 1130 63     Resp 08/16/24 1130 18     Temp 08/16/24 1130 97.6 F (36.4 C)     Temp Source 08/16/24 1130 Oral     SpO2 08/16/24 1130 100 %     Weight 08/16/24 1128 130 lb (59 kg)     Height 08/16/24 1128 5' 3 (1.6 m)     Head Circumference --      Peak Flow --      Pain Score 08/16/24 1124 8     Pain Loc --      Pain Education --      Exclude from Growth Chart --     Most recent vital signs: Vitals:   08/16/24 1130  BP: 115/88  Pulse: 63  Resp: 18  Temp: 97.6 F (36.4 C)  SpO2: 100%    Constitutional: Alert and oriented. Eyes: Conjunctivae are normal. Head: Atraumatic. Nose: No congestion/rhinnorhea. Mouth/Throat: Mucous membranes are moist.  Cardiovascular: Normal rate, regular rhythm. Grossly normal heart sounds.  2+ radial pulses bilaterally. Respiratory: Normal respiratory effort.  No retractions. Lungs CTAB. Gastrointestinal: Soft and nontender.  No CVA tenderness bilaterally.   No distention. Musculoskeletal: No lower extremity tenderness nor edema.  Neurologic:  Normal speech and language. No gross focal neurologic deficits are appreciated.    ED Results / Procedures / Treatments   Labs (all labs ordered are listed, but only abnormal results are displayed) Labs Reviewed  COMPREHENSIVE METABOLIC PANEL WITH GFR - Abnormal; Notable for the following components:      Result Value   Glucose, Bld 144 (*)    Creatinine, Ser 1.05 (*)    Total Bilirubin 1.3 (*)    All other components within normal limits  CBC - Abnormal; Notable for the following components:   RDW 11.1 (*)    All other components within normal limits  URINALYSIS, ROUTINE W REFLEX MICROSCOPIC - Abnormal; Notable for the following components:   Color, Urine YELLOW (*)    APPearance CLEAR (*)    All other components within normal limits  LIPASE, BLOOD    RADIOLOGY CT renal protocol reviewed and interpreted by me with stone in the left UVJ.  PROCEDURES:  Critical Care performed: No  Procedures   MEDICATIONS ORDERED IN ED: Medications  morphine  (PF) 4 MG/ML  injection 4 mg (4 mg Intravenous Given 08/16/24 1212)  ondansetron  (ZOFRAN ) injection 4 mg (4 mg Intravenous Given 08/16/24 1212)  lactated ringers  bolus 1,000 mL (0 mLs Intravenous Stopped 08/16/24 1451)  ketorolac  (TORADOL ) 30 MG/ML injection 15 mg (15 mg Intravenous Given 08/16/24 1358)     IMPRESSION / MDM / ASSESSMENT AND PLAN / ED COURSE  I reviewed the triage vital signs and the nursing notes.                              48 y.o. female with past medical history of GERD, kidney stones, and chronic back pain who presents to the ED complaining of acute onset left flank pain as well as nausea and vomiting.  Patient's presentation is most consistent with acute presentation with potential threat to life or bodily function.  Differential diagnosis includes, but is not limited to, kidney stone, pyonephritis, diverticulitis,  colitis, gastritis, GERD, gastroenteritis, dehydration, electrolyte abnormality, AKI.  Patient nontoxic-appearing and in no acute distress, vital signs are unremarkable.  Her abdominal exam is benign and she has no CVA tenderness bilaterally, symptoms seem most consistent with kidney stone and we will further assess with CT imaging.  Lab results and urinalysis are pending, we will treat symptomatically with IV morphine  and Zofran , hydrate with IV fluids and reassess.  Labs without significant anemia, leukocytosis, electrolyte abnormality, or AKI.  LFTs are unremarkable and urinalysis shows no signs of infection.  CT renal protocol shows 2 mm stone at the left UPJ with associated hydronephrosis.  Patient's pain significant improved following morphine  and Toradol  and she is tolerating oral intake without difficulty.  She is appropriate for discharge home with outpatient follow-up, was counseled to return to the ED for new or worsening symptoms.  Patient agrees with plan.      FINAL CLINICAL IMPRESSION(S) / ED DIAGNOSES   Final diagnoses:  Kidney stone     Rx / DC Orders   ED Discharge Orders          Ordered    oxyCODONE -acetaminophen  (PERCOCET) 5-325 MG tablet  Every 4 hours PRN        08/16/24 1526    ondansetron  (ZOFRAN -ODT) 4 MG disintegrating tablet  Every 8 hours PRN        08/16/24 1526             Note:  This document was prepared using Dragon voice recognition software and may include unintentional dictation errors.   Willo Dunnings, MD 08/16/24 [phone]

## 2024-08-16 NOTE — ED Triage Notes (Addendum)
 Pt presents to ED from home via EMS with c/o left lower abd pain; sudden onset 9am. Denies tenderness. Vomiting X1. Denies difficulty or pain when urinating and normal bowel movement this morning. Pt actively vomiting in triage.

## 2024-08-31 ENCOUNTER — Telehealth: Admitting: Physician Assistant

## 2024-08-31 DIAGNOSIS — R3989 Other symptoms and signs involving the genitourinary system: Secondary | ICD-10-CM | POA: Diagnosis not present

## 2024-08-31 MED ORDER — NITROFURANTOIN MONOHYD MACRO 100 MG PO CAPS: 100.0000 mg | ORAL_CAPSULE | Freq: Two times a day (BID) | ORAL | 0 refills | Status: DC

## 2024-08-31 NOTE — Progress Notes (Signed)

## 2024-09-27 ENCOUNTER — Encounter: Payer: Self-pay | Admitting: Family Medicine

## 2024-09-27 ENCOUNTER — Telehealth: Admitting: Family Medicine

## 2024-09-27 DIAGNOSIS — A084 Viral intestinal infection, unspecified: Secondary | ICD-10-CM

## 2024-09-27 MED ORDER — ONDANSETRON 4 MG PO TBDP: 4.0000 mg | ORAL_TABLET | Freq: Three times a day (TID) | ORAL | 0 refills | Status: DC | PRN

## 2024-09-27 NOTE — Progress Notes (Signed)

## 2024-09-28 NOTE — ED Notes (Signed)
 Pt being taken to CT by rad tech at this time

## 2024-10-04 ENCOUNTER — Ambulatory Visit (INDEPENDENT_AMBULATORY_CARE_PROVIDER_SITE_OTHER): Admitting: Vascular Surgery

## 2024-10-04 ENCOUNTER — Encounter (INDEPENDENT_AMBULATORY_CARE_PROVIDER_SITE_OTHER): Payer: Self-pay | Admitting: Vascular Surgery

## 2024-10-04 VITALS — BP 124/80 | HR 54 | Resp 17 | Ht 63.0 in | Wt 131.0 lb

## 2024-10-04 DIAGNOSIS — I82890 Acute embolism and thrombosis of other specified veins: Secondary | ICD-10-CM | POA: Diagnosis not present

## 2024-10-04 MED ORDER — APIXABAN 5 MG PO TABS: 5.0000 mg | ORAL_TABLET | Freq: Two times a day (BID) | ORAL | 2 refills | Status: DC

## 2024-10-04 NOTE — Progress Notes (Signed)
 Patient ID: Christine Simmons, female   DOB: [birth year], 48 y.o.   MRN: [id number]  Chief Complaint  Patient presents with   New Patient (Initial Visit)    np. consult see JD no studies. CT done at [facility] 09/28/24. Possible gonadal vein thrombus  ref: Maurine Hey    HPI Christine Simmons is a 48 y.o. female.  I am asked to see the patient by Dr. 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    Discussed the use of AI scribe software for clinical note transcription with the patient, who gave verbal consent to proceed.  History of Present Illness Christine Simmons is a 48 year old female who presents with left flank and pelvic pain.  In October she was diagnosed with a kidney stone. More recently she developed recurrent left flank and pelvic pain and was evaluated in the ER at [facility], where CT with contrast reportedly showed a possible gonadal vein blood clot. Her pain has improved from the initial episode but persists.  She has no personal or immediate family history of blood clots, though her mother-in-law has had them. She was recently ill with vomiting when this blood clot occurred. She notes no other new symptoms.      Results RADIOLOGY Abdominal CT with contrast: Admixture of blood in the gonadal vein, possible thrombosis.  These scans were done at [facility] and I cannot review the images personally.  Past Medical History:  Diagnosis Date   Anxiety    Arthritis    low back and neck   Chronic kidney disease    kidney stones and infection   Degenerative disc disease, cervical    Endometriosis    Family history of adverse reaction to anesthesia    unable to put mother to sleep   Frequent headaches    GERD (gastroesophageal reflux disease)    Lichen    Lower back pain    Menorrhagia    Neck pain    Ovarian cyst     Past Surgical History:  Procedure Laterality Date   ABDOMINAL HYSTERECTOMY     ANKLE SURGERY Left    CERVICAL DISC ARTHROPLASTY N/A  08/03/2019   Procedure: CERVICAL ANTERIOR DISC ARTHROPLASTY C4-6;  Surgeon: Clois Fret, MD;  Location: [facility] ORS;  Service: Neurosurgery;  Laterality: N/A;   CESAREAN SECTION     CHOLECYSTECTOMY N/A 04/10/2015   Procedure: LAPAROSCOPIC CHOLECYSTECTOMY WITH INTRAOPERATIVE CHOLANGIOGRAM;  Surgeon: Charlie FORBES Fell, MD;  Location: [facility] ORS;  Service: General;  Laterality: N/A;   ESOPHAGOGASTRODUODENOSCOPY (EGD) WITH PROPOFOL  N/A 12/12/2016   Procedure: ESOPHAGOGASTRODUODENOSCOPY (EGD) WITH PROPOFOL ;  Surgeon: Gladis RAYMOND Mariner, MD;  Location: [facility] ENDOSCOPY;  Service: Endoscopy;  Laterality: N/A;   GALLBLADDER SURGERY     LAPAROSCOPIC VAGINAL HYSTERECTOMY WITH SALPINGECTOMY Bilateral 07/07/2016   Procedure: LAPAROSCOPIC ASSISTED VAGINAL HYSTERECTOMY WITH SALPINGECTOMY;  Surgeon: Debby JINNY Dinsmore, MD;  Location: [facility] ORS;  Service: Gynecology;  Laterality: Bilateral;     Family History  Problem Relation Age of Onset   Hypertension Mother    COPD Mother    Arthritis Mother    Gallbladder disease Maternal Grandmother    Heart disease Maternal Grandmother    Hyperlipidemia Maternal Grandmother    Intellectual disability Maternal Grandmother    Hypertension Maternal Grandmother    Heart attack Maternal Grandmother    Hypertension Father    Alcohol abuse Maternal Grandfather    Cancer Maternal Grandfather    COPD Maternal Grandfather    Autoimmune disease Paternal  Aunt       Social History   Tobacco Use   Smoking status: Former    Current packs/day: 0.00    Types: Cigarettes    Quit date: 04/09/1997    Years since quitting: 27.5    Passive exposure: Past   Smokeless tobacco: Never  Vaping Use   Vaping status: Every Day   Substances: Nicotine, Flavoring  Substance Use Topics   Alcohol use: No   Drug use: Yes    Types: Marijuana     Allergies  Allergen Reactions   Sulfa Antibiotics Nausea And Vomiting, Nausea Only and Rash    Other reaction(s):  Vomiting Hallucinations    Current Outpatient Medications  Medication Sig Dispense Refill   ondansetron  (ZOFRAN -ODT) 4 MG disintegrating tablet Take 1 tablet (4 mg total) by mouth every 8 (eight) hours as needed for nausea or vomiting. 20 tablet 0   nitrofurantoin , macrocrystal-monohydrate, (MACROBID ) 100 MG capsule Take 1 capsule (100 mg total) by mouth 2 (two) times daily. 10 capsule 0   No current facility-administered medications for this visit.      REVIEW OF SYSTEMS (Negative unless checked)  Constitutional: [] Weight loss  [] Fever  [] Chills Cardiac: [] Chest pain   [] Chest pressure   [] Palpitations   [] Shortness of breath when laying flat   [] Shortness of breath at rest   [] Shortness of breath with exertion. Vascular:  [] Pain in legs with walking   [] Pain in legs at rest   [] Pain in legs when laying flat   [] Claudication   [] Pain in feet when walking  [] Pain in feet at rest  [] Pain in feet when laying flat   [] History of DVT   [] Phlebitis   [] Swelling in legs   [] Varicose veins   [] Non-healing ulcers Pulmonary:   [] Uses home oxygen   [] Productive cough   [] Hemoptysis   [] Wheeze  [] COPD   [] Asthma Neurologic:  [] Dizziness  [] Blackouts   [] Seizures   [] History of stroke   [] History of TIA  [] Aphasia   [] Temporary blindness   [] Dysphagia   [] Weakness or numbness in arms   [] Weakness or numbness in legs Musculoskeletal:  [x] Arthritis   [] Joint swelling   [] Joint pain   [x] Low back pain Hematologic:  [] Easy bruising  [] Easy bleeding   [] Hypercoagulable state   [] Anemic  [] Hepatitis Gastrointestinal:  [] Blood in stool   [] Vomiting blood  [x] Gastroesophageal reflux/heartburn   [x] Abdominal pain Genitourinary:  [] Chronic kidney disease   [] Difficult urination  [] Frequent urination  [] Burning with urination   [] Hematuria Skin:  [] Rashes   [] Ulcers   [] Wounds Psychological:  [x] History of anxiety   []  History of major depression.    Physical Exam BP 124/80   Pulse (!) 54   Resp 17   Ht  5' 3 (1.6 m)   Wt 131 lb (59.4 kg)   LMP 06/18/2016 (Exact Date)   BMI 23.21 kg/m  Gen:  WD/WN, NAD Head: [place]/AT, No temporalis wasting.  Ear/Nose/Throat: Hearing grossly intact, nares w/o erythema or drainage, oropharynx w/o Erythema/Exudate Eyes: Conjunctiva clear, sclera non-icteric  Neck: trachea midline.  No JVD.  Pulmonary:  Good air movement, respirations not labored, no use of accessory muscles  Cardiac: Bradycardic Vascular:  Vessel Right Left  Radial Palpable Palpable                                   Gastrointestinal:. No masses, surgical incisions, or scars. Musculoskeletal: M/S 5/5 throughout.  Extremities without ischemic changes.  No deformity or atrophy.  No edema. Neurologic: Sensation grossly intact in extremities.  Symmetrical.  Speech is fluent. Motor exam as listed above. Psychiatric: Judgment intact, Mood & affect appropriate for pt's clinical situation. Dermatologic: No rashes or ulcers noted.  No cellulitis or open wounds.  Physical Exam   Radiology No results found.  Labs Recent Results (from the past 2160 hours)  Lipase, blood     Status: None   Collection Time: 08/16/24 11:34 AM  Result Value Ref Range   Lipase 30 11 - 51 U/L    Comment: Performed at [facility], [address] [place], [place] [address]  Comprehensive metabolic panel     Status: Abnormal   Collection Time: 08/16/24 11:34 AM  Result Value Ref Range   Sodium 140 135 - 145 mmol/L   Potassium 3.8 3.5 - 5.1 mmol/L   Chloride 104 98 - 111 mmol/L   CO2 23 22 - 32 mmol/L   Glucose, Bld 144 (H) 70 - 99 mg/dL    Comment: Glucose reference range applies only to samples taken after fasting for at least 8 hours.   BUN 14 6 - 20 mg/dL   Creatinine, Ser 8.94 (H) 0.44 - 1.00 mg/dL   Calcium 9.4 8.9 - 89.6 mg/dL   Total Protein 8.1 6.5 - 8.1 g/dL   Albumin 4.3 3.5 - 5.0 g/dL   AST 23 15 - 41 U/L   ALT 14 0 - 44 U/L   Alkaline Phosphatase 47 38 - 126 U/L   Total  Bilirubin 1.3 (H) 0.0 - 1.2 mg/dL   GFR, Estimated >39 >39 mL/min    Comment: (NOTE) Calculated using the CKD-EPI Creatinine Equation (2021)    Anion gap 13 5 - 15    Comment: Performed at [facility], [address] [place], [place] [address]  CBC     Status: Abnormal   Collection Time: 08/16/24 11:34 AM  Result Value Ref Range   WBC 7.3 4.0 - 10.5 K/uL   RBC 4.79 3.87 - 5.11 MIL/uL   Hemoglobin 14.5 12.0 - 15.0 g/dL   HCT 56.4 63.9 - 53.9 %   MCV 90.8 80.0 - 100.0 fL   MCH 30.3 26.0 - 34.0 pg   MCHC 33.3 30.0 - 36.0 g/dL   RDW 88.8 (L) 88.4 - 84.4 %   Platelets 236 150 - 400 K/uL   nRBC 0.0 0.0 - 0.2 %    Comment: Performed at [facility], [address] [place], [place] [address]  Urinalysis, Routine w reflex microscopic -Urine, Clean Catch     Status: Abnormal   Collection Time: 08/16/24 11:34 AM  Result Value Ref Range   Color, Urine YELLOW (A) YELLOW   APPearance CLEAR (A) CLEAR   Specific Gravity, Urine 1.016 1.005 - 1.030   pH 7.0 5.0 - 8.0   Glucose, UA NEGATIVE NEGATIVE mg/dL   Hgb urine dipstick NEGATIVE NEGATIVE   Bilirubin Urine NEGATIVE NEGATIVE   Ketones, ur NEGATIVE NEGATIVE mg/dL   Protein, ur NEGATIVE NEGATIVE mg/dL   Nitrite NEGATIVE NEGATIVE   Leukocytes,Ua NEGATIVE NEGATIVE    Comment: Performed at [facility], [address] [place], [place] [address]    Assessment/Plan: Assessment & Plan Left Gonadal vein thrombosis Possible gonadal vein thrombosis on CT with contrast. Symptoms improved but persistent. No family history of clots. Low risk of pulmonary embolism. Blood thinners discussed as treatment due to low bleeding risk and potential symptom  relief. - Prescribed Eliquis  for three months. - Provided Eliquis  samples if available. - Scheduled follow-up in two months to assess symptom improvement. - If symptoms do not improve, will order CT venogram.       Selinda Gu 10/04/2024, 2:59 PM   This note was  created with Dragon medical transcription system.  Any errors from dictation are unintentional.

## 2024-10-05 ENCOUNTER — Encounter (INDEPENDENT_AMBULATORY_CARE_PROVIDER_SITE_OTHER): Payer: Self-pay | Admitting: Vascular Surgery

## 2024-10-05 ENCOUNTER — Other Ambulatory Visit (INDEPENDENT_AMBULATORY_CARE_PROVIDER_SITE_OTHER): Payer: Self-pay

## 2024-10-05 MED ORDER — APIXABAN 5 MG PO TABS: 5.0000 mg | ORAL_TABLET | Freq: Two times a day (BID) | ORAL | 2 refills | Status: AC

## 2024-10-14 ENCOUNTER — Other Ambulatory Visit: Payer: Self-pay | Admitting: Gastroenterology

## 2024-10-14 DIAGNOSIS — R9389 Abnormal findings on diagnostic imaging of other specified body structures: Secondary | ICD-10-CM

## 2024-10-18 ENCOUNTER — Ambulatory Visit
Admission: RE | Admit: 2024-10-18 | Discharge: 2024-10-18 | Disposition: A | Discharge status: Home / Self Care | Source: Ambulatory Visit | Attending: Gastroenterology | Admitting: Gastroenterology

## 2024-10-18 ENCOUNTER — Other Ambulatory Visit: Payer: Self-pay | Admitting: Gastroenterology

## 2024-10-18 DIAGNOSIS — R9389 Abnormal findings on diagnostic imaging of other specified body structures: Secondary | ICD-10-CM | POA: Insufficient documentation

## 2024-10-18 MED ORDER — GADOBUTROL 1 MMOL/ML IV SOLN
6.0000 mL | Freq: Once | INTRAVENOUS | Status: AC | PRN
  Administered 2024-10-18: 6 mL via INTRAVENOUS

## 2024-10-25 ENCOUNTER — Telehealth: Admitting: Physician Assistant

## 2024-10-25 DIAGNOSIS — A084 Viral intestinal infection, unspecified: Secondary | ICD-10-CM

## 2024-10-25 MED ORDER — ONDANSETRON 4 MG PO TBDP: 4.0000 mg | ORAL_TABLET | Freq: Three times a day (TID) | ORAL | 0 refills | Status: AC | PRN

## 2024-10-25 NOTE — Progress Notes (Signed)
 We are sorry that you are not feeling well. Here is how we plan to help!  Based on what you have shared with me it looks like you have a Virus that is irritating your GI tract.  Vomiting is the forceful emptying of a portion of the stomach's content through the mouth.  Although nausea and vomiting can make you feel miserable, it's important to remember that these are not diseases, but rather symptoms of an underlying illness.  When we treat short term symptoms, we always caution that any symptoms that persist should be fully evaluated in a medical office.  I have prescribed a medication that will help alleviate your symptoms and allow you to stay hydrated:  Zofran 4 mg 1 tablet every 8 hours as needed for nausea and vomiting  HOME CARE: Drink clear liquids.  This is very important! Dehydration (the lack of fluid) can lead to a serious complication.  Start off with 1 tablespoon every 5 minutes for 8 hours. You may begin eating bland foods after 8 hours without vomiting.  Start with saltine crackers, white bread, rice, mashed potatoes, applesauce. After 48 hours on a bland diet, you may resume a normal diet. Try to go to sleep.  Sleep often empties the stomach and relieves the need to vomit.  GET HELP RIGHT AWAY IF:  Your symptoms do not improve or worsen within 2 days after treatment. You have a fever for over 3 days. You cannot keep down fluids after trying the medication.  MAKE SURE YOU:  Understand these instructions. Will watch your condition. Will get help right away if you are not doing well or get worse.   Thank you for choosing an e-visit. Your e-visit answers were reviewed by a board certified advanced clinical practitioner to complete your personal care plan. Depending upon the condition, your plan could have included both over the counter or prescription medications. Please review your pharmacy choice. Be sure that the pharmacy you have chosen is open so that you can pick up  your prescription now.  If there is a problem you may message your provider in MyChart to have the prescription routed to another pharmacy. Your safety is important to us . If you have drug allergies check your prescription carefully.  For the next 24 hours, you can use MyChart to ask questions about today's visit, request a non-urgent call back, or ask for a work or school excuse from your e-visit provider. You will get an e-mail in the next two days asking about your experience. I hope that your e-visit has been valuable and will speed your recovery.  I have spent 5 minutes in review of e-visit questionnaire, review and updating patient chart, medical decision making and response to patient.   Elsie Velma Lunger, PA-C

## 2024-11-12 ENCOUNTER — Telehealth: Admitting: Physician Assistant

## 2024-11-12 DIAGNOSIS — R112 Nausea with vomiting, unspecified: Secondary | ICD-10-CM

## 2024-11-12 NOTE — Progress Notes (Signed)
 Based on what you shared with me, I feel your condition warrants further evaluation as soon as possible at an Emergency department. You were recently seen for similar symptoms that have not resolved, I feel your condition warrants further evaluation and I recommend that you be seen in a face-to-face visit.   NOTE: There will be NO CHARGE for this eVisit   If you are having a true medical emergency please call 911.      Emergency Department-[facility]  Get Driving Directions  [phone]  [address]  [place], [place] [address]  Open 24/7/365      [facility] Emergency Department at [facility]  Get Driving Directions  [address]  [place], [place] [address]  Open 24/7/365    Emergency Department- [facility]  Get Driving Directions  [phone]  [address]  [place], [place] [address]  Open 24/7/365      [facility] at [facility]  Get Driving Directions  [phone]  [address]  [place], [place] [address]  Open 24/7/365    [facility]  Emergency Department- [facility]  Get Driving Directions  [phone]  [address]  [place], [place] [address]  Open 24/7/365    [place]  Emergency Department- [facility]  Get Driving Directions  [address]  [place], [place] [address]  Open 24/7/365    [facility]  Emergency Department- [facility]  Get Driving Directions  [phone]  [address]  [place], [place] [address]  Open 24/7/365

## 2024-12-13 ENCOUNTER — Ambulatory Visit (INDEPENDENT_AMBULATORY_CARE_PROVIDER_SITE_OTHER): Admitting: Vascular Surgery
